# Patient Record
Sex: Female | Born: 2000
Health system: Southern US, Community
[De-identification: ages and names within clinical notes are randomized; demographics above are authoritative.]

## PROBLEM LIST (undated history)

## (undated) ENCOUNTER — Emergency Department (HOSPITAL_COMMUNITY): Disposition: A | Discharge status: Home / Self Care | Payer: Medicaid Other

## (undated) ENCOUNTER — Ambulatory Visit: Admission: EM | Source: Home / Self Care

## (undated) ENCOUNTER — Ambulatory Visit

## (undated) ENCOUNTER — Emergency Department (HOSPITAL_BASED_OUTPATIENT_CLINIC_OR_DEPARTMENT_OTHER): Discharge status: Absent without leave

## (undated) DIAGNOSIS — Z789 Other specified health status: Secondary | ICD-10-CM

## (undated) DIAGNOSIS — I2699 Other pulmonary embolism without acute cor pulmonale: Secondary | ICD-10-CM

## (undated) HISTORY — PX: NO PAST SURGERIES: SHX2092

## (undated) HISTORY — PX: WISDOM TOOTH EXTRACTION: SHX21

---
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
## 2019-01-25 ENCOUNTER — Other Ambulatory Visit: Payer: Self-pay

## 2019-01-25 ENCOUNTER — Ambulatory Visit (INDEPENDENT_AMBULATORY_CARE_PROVIDER_SITE_OTHER): Payer: Medicaid Other | Admitting: Family Medicine

## 2019-01-25 ENCOUNTER — Encounter: Payer: Self-pay | Admitting: Family Medicine

## 2019-01-25 VITALS — BP 105/70 | HR 70 | Temp 97.9°F | Ht 62.99 in | Wt 194.4 lb

## 2019-01-25 DIAGNOSIS — N926 Irregular menstruation, unspecified: Secondary | ICD-10-CM

## 2019-01-25 DIAGNOSIS — Z0000 Encounter for general adult medical examination without abnormal findings: Secondary | ICD-10-CM

## 2019-01-25 LAB — POCT URINE PREGNANCY: Preg Test, Ur: NEGATIVE

## 2019-01-25 NOTE — Patient Instructions (Addendum)
Thank you for coming in to see [place] today. Please see below to review our plan for today's visit.  I do not think your birth control is causing your nausea.  I would keep a diary and record when you have your episodes, how long they last, what you are doing or eating prior to the episode.  Bring these to your next visit in 1 month.  Please call the clinic at [phone] if your symptoms worsen or you have any concerns. It was our pleasure to serve you.  Harriet Butte, [place], PGY-3

## 2019-01-25 NOTE — Progress Notes (Signed)
Routine Well-Adolescent Visit  Desirea's personal or confidential phone number: [phone]  PCP:  Bing, DO   History was provided by the patient.  Angela Johnston is a 18 y.o. female who is here for annual check-up.   Current concerns: Astaria has concerns about intermittent nausea without vomiting which does not occur at any particular time of day.  This will usually occur when she is eating.    Adolescent Assessment:  Confidentiality was discussed with the patient and if applicable, with caregiver as well.  Home and Environment:  Lives with: lives at home with mother and sister and brothers Parental relations: separated, lives with mom Friends/Peers: good Nutrition/Eating Behaviors: no Sports/Exercise:  no  Education and Employment:  School Status: in graduated, plans to work Work: [employer] at [employer]: none  With parent out of the room and confidentiality discussed:   Patient reports being comfortable and safe at school and at home? Yes  Smoking: no Secondhand smoke exposure? no Drugs/EtOH: no   Sexuality:  -Menarche: post menarchal - females:  last menses: about 2 years ago, on OCP - Menstrual History: absent  - Sexually active? yes - one female, uses protection  - sexual partners in last year: No immediate complications noted. - contraception use: condoms, oral contraceptives (estrogen/progesterone) - Last STI Screening: none - Violence/Abuse: no  Mood: Suicidality and Depression: no Weapons: no  Screenings: The patient completed the Rapid Assessment for Adolescent Preventive Services screening questionnaire and the following topics were identified as risk factors and discussed: healthy eating, exercise, abuse/trauma, weapon use, tobacco use, marijuana use, drug use, condom use and birth control   PHQ-9 completed and results indicated negative  Physical Exam:  BP 105/70   Pulse 70   Temp 97.9 F (36.6 C)   Ht 5' 2.99" (1.6 m)    Wt 194 lb 6.4 oz (88.2 kg)   SpO2 99%   BMI 34.45 kg/m  Blood pressure percentiles are not available for patients who are 18 years or older.  General Appearance:   alert, oriented, no acute distress and well nourished  HENT: Normocephalic, no obvious abnormality, PERRL, EOM's intact, conjunctiva clear  Mouth:   Normal appearing teeth, no obvious discoloration, dental caries, or dental caps  Neck:   Supple; thyroid: no enlargement, symmetric, no tenderness/mass/nodules  Lungs:   Clear to auscultation bilaterally, normal work of breathing  Heart:   Regular rate and rhythm, S1 and S2 normal, no murmurs;   Abdomen:   Soft, non-tender, no mass, or organomegaly  GU genitalia not examined  Musculoskeletal:   Tone and strength strong and symmetrical, all extremities               Lymphatic:   No cervical adenopathy  Skin/Hair/Nails:   Skin warm, dry and intact, no rashes, no bruises or petechiae  Neurologic:   Strength, gait, and coordination normal and age-appropriate    Assessment/Plan:  Brennyn is complaining of nausea without vomiting.  Urine pregnancy negative. I am uncertain what the source of this would be.  It does not appear to be her OCP since she has been on this for several years and the onset of her nausea has been over the course of 2 months.  There is no clear triggers.  Her weight is up from her prior growth chart.  Advised her to record the episodes of nausea and return to the clinic in 1 month.  BMI: is appropriate for age  Immunizations today: per orders. History  of previous adverse reactions to immunizations? no Counseling completed for all of the vaccine components. Orders Placed This Encounter  Procedures  . POCT urine pregnancy   - Follow-up visit in 1 year for next visit, or sooner as needed.   Wendee Beaversavid J McMullen, DO

## 2019-02-19 ENCOUNTER — Ambulatory Visit: Payer: Self-pay | Admitting: Family Medicine

## 2019-03-02 ENCOUNTER — Encounter (HOSPITAL_COMMUNITY): Payer: Self-pay

## 2019-03-02 ENCOUNTER — Ambulatory Visit (HOSPITAL_COMMUNITY)
Admission: EM | Admit: 2019-03-02 | Discharge: 2019-03-02 | Disposition: A | Discharge status: Home / Self Care | Payer: Medicaid Other | Attending: Emergency Medicine | Admitting: Emergency Medicine

## 2019-03-02 ENCOUNTER — Other Ambulatory Visit: Payer: Self-pay

## 2019-03-02 DIAGNOSIS — M25522 Pain in left elbow: Secondary | ICD-10-CM

## 2019-03-02 DIAGNOSIS — M546 Pain in thoracic spine: Secondary | ICD-10-CM | POA: Diagnosis not present

## 2019-03-02 MED ORDER — IBUPROFEN 600 MG PO TABS: 600.0000 mg | ORAL_TABLET | Freq: Four times a day (QID) | ORAL | 0 refills | Status: DC | PRN

## 2019-03-02 NOTE — ED Provider Notes (Signed)
[facility]    CSN: [id number] Arrival date & time: 03/02/19  [id number]     History   Chief Complaint Chief Complaint  Patient presents with  . Back Pain  . Arm Numbness    HPI Angela Johnston is a 18 y.o. female.   Patient presents with left upper back pain with left arm pain and numbness since she woke up this morning.  She reports her pain was initially 5/10 but has been steadily, spontaneously improving over the course of the morning.  She denies aggravating or alleviating factors.  She denies injury or falls.  She denies chest pain, shortness of breath, abdominal pain, nausea, vomiting, diarrhea, dysuria, pelvic pain, vaginal discharge.    The history is provided by the patient.    History reviewed. No pertinent past medical history.  Patient Active Problem List   Diagnosis Date Noted  . 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 Allergic rhinitis 03/09/2012  . 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 No pertinent surgical history.  OB History   No obstetric history on file.      Home Medications    Prior to Admission medications   Medication Sig Start Date End Date Taking? Authorizing Provider  ibuprofen (ADVIL) 600 MG tablet Take 1 tablet (600 mg total) by mouth every 6 (six) hours as needed. 03/02/19   Mickie Bailate, Bela Bonaparte H, NP  JUNEL 1.5/30 1.5-30 MG-MCG tablet TAKE 1 TABLET BY MOUTH EVERY DAY 08/17/18   Wendee BeaversMcMullen, David J, DO  sulfamethoxazole-trimethoprim (BACTRIM DS,SEPTRA DS) 800-160 MG tablet Take 1 tablet by mouth 2 (two) times daily. 07/31/18   Wallis BambergMani, Mario, PA-C    Family History Family History  Family history unknown: Yes    Social History Social History   Tobacco Use  . Smoking status: Never Smoker  . Smokeless tobacco: Never Used  Substance Use Topics  . Alcohol use: Not on file  . Drug use: Not on file     Allergies   Patient has no known allergies.   Review of Systems Review of Systems  Constitutional:  Negative for chills and fever.  HENT: Negative for ear pain and sore throat.   Eyes: Negative for pain and visual disturbance.  Respiratory: Negative for cough and shortness of breath.   Cardiovascular: Negative for chest pain and palpitations.  Gastrointestinal: Negative for abdominal pain and vomiting.  Genitourinary: Negative for dysuria and hematuria.  Musculoskeletal: Positive for back pain and myalgias. Negative for arthralgias.  Skin: Negative for color change and rash.  Neurological: Negative for seizures and syncope.  All other systems reviewed and are negative.    Physical Exam Triage Vital Signs ED Triage Vitals  Enc Vitals Group     BP 03/02/19 0921 124/87     Pulse Rate 03/02/19 0921 80     Resp 03/02/19 0921 17     Temp 03/02/19 0921 98.3 F (36.8 C)     Temp Source 03/02/19 0921 Oral     SpO2 03/02/19 0921 94 %     Weight --      Height --      Head Circumference --      Peak Flow --      Pain Score 03/02/19 0922 5     Pain Loc --      Pain Edu? --      Excl. in GC? --    No data found.  Updated Vital Signs BP 124/87 (BP Location: Left Arm)  Pulse 80   Temp 98.3 F (36.8 C) (Oral)   Resp 17   SpO2 94%   Visual Acuity Right Eye Distance:   Left Eye Distance:   Bilateral Distance:    Right Eye Near:   Left Eye Near:    Bilateral Near:     Physical Exam Vitals signs and nursing note reviewed.  Constitutional:      General: She is not in acute distress.    Appearance: She is well-developed.  HENT:     Head: Normocephalic and atraumatic.  Eyes:     Conjunctiva/sclera: Conjunctivae normal.  Neck:     Musculoskeletal: Neck supple.  Cardiovascular:     Rate and Rhythm: Normal rate and regular rhythm.     Heart sounds: No murmur.  Pulmonary:     Effort: Pulmonary effort is normal. No respiratory distress.     Breath sounds: Normal breath sounds.  Abdominal:     Palpations: Abdomen is soft.     Tenderness: There is no abdominal tenderness.   Musculoskeletal: Normal range of motion.        General: No swelling, tenderness, deformity or signs of injury.  Skin:    General: Skin is warm and dry.     Findings: No bruising, erythema, lesion or rash.  Neurological:     General: No focal deficit present.     Mental Status: She is alert and oriented to person, place, and time.     Sensory: No sensory deficit.     Motor: No weakness.     Coordination: Coordination normal.     Gait: Gait normal.     Deep Tendon Reflexes: Reflexes normal.      UC Treatments / Results  Labs (all labs ordered are listed, but only abnormal results are displayed) Labs Reviewed - No data to display  EKG   Radiology No results found.  Procedures Procedures (including critical care time)  Medications Ordered in UC Medications - No data to display  Initial Impression / Assessment and Plan / UC Course  I have reviewed the triage vital signs and the nursing notes.  Pertinent labs & imaging results that were available during my care of the patient were reviewed by me and considered in my medical decision making (see chart for details).   Left back and arm pain.  Unable to elicit or re-create pain on exam.  Treating today with ibuprofen as needed.  Discussed with patient that she should return here or go to the ED if her pain worsens; or if she develops other concerning symptoms such as chest pain, shortness of breath, abdominal pain, vomiting, diarrhea, dysuria.     Final Clinical Impressions(s) / UC Diagnoses   Final diagnoses:  Acute left-sided thoracic back pain  Arthralgia of left upper arm     Discharge Instructions     Take the prescribed ibuprofen as needed for your pain.    Return here or go to the emergency department if you develop worsening pain; or if you develop chest pain, shortness of breath, abdominal pain, vomiting, diarrhea, difficulty with urination, or other symptoms.        ED Prescriptions    Medication Sig  Dispense Auth. Provider   ibuprofen (ADVIL) 600 MG tablet Take 1 tablet (600 mg total) by mouth every 6 (six) hours as needed. 30 tablet Sharion Balloon, NP     Controlled Substance Prescriptions [place] Controlled Substance Registry consulted? Not Applicable   Sharion Balloon, NP 03/02/19 1013

## 2019-03-02 NOTE — ED Notes (Signed)
Patient able to ambulate independently  

## 2019-03-02 NOTE — ED Notes (Signed)
Pt at nurse's station, requested a cup of ice, provided

## 2019-03-02 NOTE — ED Triage Notes (Signed)
Pt presents with left lower back pain and left side arm numbness since this morning.

## 2019-03-02 NOTE — Discharge Instructions (Addendum)
Take the prescribed ibuprofen as needed for your pain.    Return here or go to the emergency department if you develop worsening pain; or if you develop chest pain, shortness of breath, abdominal pain, vomiting, diarrhea, difficulty with urination, or other symptoms.

## 2019-03-06 ENCOUNTER — Ambulatory Visit (INDEPENDENT_AMBULATORY_CARE_PROVIDER_SITE_OTHER): Payer: 59 | Admitting: Family Medicine

## 2019-03-06 ENCOUNTER — Encounter: Payer: Self-pay | Admitting: Family Medicine

## 2019-03-06 ENCOUNTER — Other Ambulatory Visit: Payer: Self-pay

## 2019-03-06 DIAGNOSIS — M549 Dorsalgia, unspecified: Secondary | ICD-10-CM

## 2019-03-06 DIAGNOSIS — Z3009 Encounter for other general counseling and advice on contraception: Secondary | ICD-10-CM | POA: Diagnosis not present

## 2019-03-06 NOTE — Progress Notes (Signed)
Date of Visit: 03/06/2019   HPI:  Angela Johnston is a 18 year old female presenting with back pain, breast tenderness and loss of appetite which she attributes to her birth control. The back pain started a month ago and is non-radiating and constant, not relived by alive or ibuprofen. The breast tenderness has been on and off for the past month. Her loss of appetite is better on some days and worse on others. Denies nausea, vomiting, or diarrhea. She reports noticeable weight gain. No vaginal discharge or spotting but some mild cramping. Her last menstrual cycle was 2-3 years ago. No headaches or fevers.   She has been on Junel for the past 5 years and is requesting to stop taking the medication. She has a weeks worth of pills left. For future pregnancy prevention, she wants to use condoms.   She was seen at an urgent care 5 days ago for left sided back pain probably MSK in origin which was treated with 600mg  of ibuprofen. She has been taking 1 and half pills of the ibuprofen but still reports pain.   ROS: See HPI.  PHYSICAL EXAM: BP 105/62   Pulse 98   Temp 97.9 F (36.6 C) (Oral)   Wt 200 lb (90.7 kg)   SpO2 98%   BMI 35.44 kg/m  Gen: Alert and oriented. In no acute distress.  HEENT: Pupils round and reactive to light. Good oculomotor function. No redness of TM. MMM without redness of the oropharynx. No cervical LAD.  Heart: S1S2 RRR without murmurs or gallops. Good capillary refill.  Lungs: CTA bilaterally no wheezing or crackles. Good work of breathing.  Abd: Normoactive bowel sounds. Soft and non-tender abdomen. Tender to palpation over the left ribs.  Ext: No LEE.   ASSESSMENT/PLAN:  Birth control counseling Patient has been experiencing weight gain, loss of appetite, breast tenderness and back pain over the past month which she attributes to her birth control pills. She is interested in stopping the pills after next week and is planning on using condoms for pregnancy prevention.  Patient counseled on  the different pregnancy options and their efficacy rates and was encouraged to reach out to the office with questions or if she changes her mind about her method of birth control.   Back pain Patient was last seen at an urgent care on 7/31 for acute back pain over the left ribcage. The area was tender to palpation today and most likely has an MSK origin. The absence of recent trauma to the area and good range of motion an on physical exam along with no pain with breathing makes a rib fracture less likely. Advised to alternate between the 600 mg of ibuprofen and 650 Mg of Tylenol every 6 hours. In addition, topical Voltaren gel every 3 hours with a heating pack can be helpful. Follow up in to 1 week if pain gets worse in quality with current regimen.     FOLLOW UP: Follow up if any changes in back pain or if wanting to discuses other options of birth control.   Vernice Jefferson MS3, Medical Student

## 2019-03-06 NOTE — Assessment & Plan Note (Addendum)
Patient was last seen at an urgent care on 7/31 for acute back pain over the left ribcage. The area was tender to palpation today and most likely has an MSK origin. The absence of recent trauma to the area and good range of motion an on physical exam along with no pain with breathing makes a rib fracture less likely. Advised to alternate between the 600 mg of ibuprofen and 650 Mg of Tylenol every 6 hours. In addition, topical Voltaren gel every 3 hours with a heating pack can be helpful. Follow up in to 1 week if pain gets worse in quality with current regimen.

## 2019-03-06 NOTE — Assessment & Plan Note (Signed)
Patient has been experiencing weight gain, loss of appetite, breast tenderness and back pain over the past month which she attributes to her birth control pills. She is interested in stopping the pills after next week and is planning on using condoms for pregnancy prevention. Patient counseled on  the different pregnancy options and their efficacy rates and was encouraged to reach out to the office with questions or if she changes her mind about her method of birth control.

## 2019-03-06 NOTE — Patient Instructions (Signed)
It was great meeting you today!  I am sorry you have had some side effects from your contraceptive pills.  We discussed a lot of alternatives but ultimately decided to just go with condoms.  If you change your mind please let me know and I be happy to assist you with other options.  In regards to your back pain think you likely have a musculoskeletal problem.  Likely due to 1 your back muscles or perhaps 1 of your rib muscles.  I would continue take the ibuprofen 600 mg every 6 hours.  You can take Tylenol 650 mg in between those doses.  You can pick up topical Voltaren gel apply 4-5 times per day to the affected area.  This will likely go away within the next 2 to 3 days.

## 2019-03-09 ENCOUNTER — Encounter: Payer: Self-pay | Admitting: Family Medicine

## 2019-05-13 DIAGNOSIS — R05 Cough: Secondary | ICD-10-CM | POA: Diagnosis not present

## 2019-05-13 DIAGNOSIS — Z20828 Contact with and (suspected) exposure to other viral communicable diseases: Secondary | ICD-10-CM | POA: Diagnosis not present

## 2019-06-05 ENCOUNTER — Encounter (HOSPITAL_COMMUNITY): Payer: Self-pay

## 2019-06-05 ENCOUNTER — Other Ambulatory Visit: Payer: Self-pay

## 2019-06-05 DIAGNOSIS — R531 Weakness: Secondary | ICD-10-CM | POA: Diagnosis not present

## 2019-06-05 DIAGNOSIS — R009 Unspecified abnormalities of heart beat: Secondary | ICD-10-CM | POA: Diagnosis present

## 2019-06-05 NOTE — ED Triage Notes (Signed)
Pt arrived stating 2-3 hours ago she starting feeling weak and felt as though she has an irregular heartbeat. Denies doing anything at the time. Denies any chest pain or shortness of breath.

## 2019-06-06 ENCOUNTER — Emergency Department (HOSPITAL_COMMUNITY)
Admission: EM | Admit: 2019-06-06 | Discharge: 2019-06-06 | Disposition: A | Discharge status: Home / Self Care | Payer: Medicaid Other | Attending: Emergency Medicine | Admitting: Emergency Medicine

## 2019-06-06 DIAGNOSIS — R531 Weakness: Secondary | ICD-10-CM

## 2019-06-06 LAB — CBC
HCT: 39.5 % (ref 36.0–46.0)
Hemoglobin: 12.9 g/dL (ref 12.0–15.0)
MCH: 28.2 pg (ref 26.0–34.0)
MCHC: 32.7 g/dL (ref 30.0–36.0)
MCV: 86.2 fL (ref 80.0–100.0)
Platelets: 284 10*3/uL (ref 150–400)
RBC: 4.58 MIL/uL (ref 3.87–5.11)
RDW: 13.1 % (ref 11.5–15.5)
WBC: 8.2 10*3/uL (ref 4.0–10.5)
nRBC: 0 % (ref 0.0–0.2)

## 2019-06-06 LAB — BASIC METABOLIC PANEL
Anion gap: 8 (ref 5–15)
BUN: 7 mg/dL (ref 6–20)
CO2: 27 mmol/L (ref 22–32)
Calcium: 9.5 mg/dL (ref 8.9–10.3)
Chloride: 106 mmol/L (ref 98–111)
Creatinine, Ser: 0.79 mg/dL (ref 0.44–1.00)
GFR calc Af Amer: >60 mL/min (ref >60)
GFR calc non Af Amer: >60 mL/min (ref >60)
Glucose, Bld: 91 mg/dL (ref 70–99)
Potassium: 3.5 mmol/L (ref 3.5–5.1)
Sodium: 141 mmol/L (ref 135–145)

## 2019-06-06 LAB — RAPID URINE DRUG SCREEN, HOSP PERFORMED
Amphetamines: NOT DETECTED
Barbiturates: NOT DETECTED
Benzodiazepines: NOT DETECTED
Cocaine: NOT DETECTED
Opiates: NOT DETECTED
Tetrahydrocannabinol: NOT DETECTED

## 2019-06-06 LAB — I-STAT BETA HCG BLOOD, ED (MC, WL, AP ONLY): I-stat hCG, quantitative: <5 m[IU]/mL (ref <5)

## 2019-06-06 NOTE — ED Notes (Signed)
Pt verbalized discharge instructions and follow up care. Alert and ambulatory. No IV. Significant other is driving pt home

## 2019-06-06 NOTE — Discharge Instructions (Addendum)
Your laboratory results were within normal limits.  Please follow up with [facility], their number is attached to your paperwork.

## 2019-06-06 NOTE — ED Provider Notes (Signed)
[facility] DEPT Provider Note   CSN: [id number] Arrival date & time: 06/05/19  2246     History   Chief Complaint Chief Complaint  Patient presents with  . Irregular Heart Beat    HPI Angela Johnston is a 18 y.o. female.     18 y.o female with no PMH presents to the ED with a chief complaint of "irregular heartbeat". Patient reports she was playing with boyfriend when he suddenly sat on her chest, she reports taking her right hand and placing along her carotid, states she did not feel a beat at the time.  She states "I can usually feel it beating up they are always ".  Also reports she has been feeling more fatigued, overall weak since 2 PM today.  Patient also endorses some dizziness, feels like she is spinning although was ambulatory in the ED with a steady gait.  She reports her last meal was prior to arrival.  She endorses a cough which is somewhat dry, has not taken any medication for improvement in symptoms.  Her last menstrual period was October 16.  She is currently not on any birth control, was on OCPs but discontinued these in August.  She denies any chest pain, shortness of breath, abdominal pain, fever, headaches, prior history of blood clots.  The history is provided by the patient.    History reviewed. No pertinent past medical history.  Patient Active Problem List   Diagnosis Date Noted  . Birth control counseling 03/06/2019  . Back pain 03/06/2019  . 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 Allergic rhinitis 03/09/2012  . Intentional self-harm by sharp object ([place]) 02/08/2011    History reviewed. No pertinent surgical history.   OB History   No obstetric history on file.      Home Medications    Prior to Admission medications   Medication Sig Start Date End Date Taking? Authorizing Provider  ibuprofen (ADVIL) 600 MG tablet Take 1 tablet (600 mg total) by mouth every 6 (six) hours as needed. 03/02/19   Sharion Balloon, NP   sulfamethoxazole-trimethoprim (BACTRIM DS,SEPTRA DS) 800-160 MG tablet Take 1 tablet by mouth 2 (two) times daily. 07/31/18   Jaynee Eagles, PA-C    Family History Family History  Family history unknown: Yes    Social History Social History   Tobacco Use  . Smoking status: Never Smoker  . Smokeless tobacco: Never Used  Substance Use Topics  . Alcohol use: Not on file  . Drug use: Not on file     Allergies   Patient has no known allergies.   Review of Systems Review of Systems  Constitutional: Negative for chills and fever.  HENT: Negative for ear pain and sore throat.   Eyes: Negative for pain and visual disturbance.  Respiratory: Negative for cough and shortness of breath.   Cardiovascular: Positive for palpitations. Negative for chest pain.  Gastrointestinal: Negative for abdominal pain and vomiting.  Genitourinary: Negative for dysuria and hematuria.  Musculoskeletal: Negative for arthralgias and back pain.  Skin: Negative for color change and rash.  Neurological: Positive for weakness. Negative for seizures, syncope and headaches.  All other systems reviewed and are negative.    Physical Exam Updated Vital Signs BP (!) 148/73   Pulse 63   Temp 98.2 F (36.8 C) (Oral)   Resp 11   Ht 5\' 4"  (1.626 m)   Wt 90.7 kg   SpO2 100%   BMI 34.33 kg/m   Physical Exam  Vitals signs and nursing note reviewed.  Constitutional:      General: She is not in acute distress.    Appearance: She is well-developed.     Comments: In no distress, nonill appearing.  HENT:     Head: Normocephalic and atraumatic.     Mouth/Throat:     Pharynx: No oropharyngeal exudate.  Eyes:     Pupils: Pupils are equal, round, and reactive to light.  Neck:     Musculoskeletal: Normal range of motion.  Cardiovascular:     Rate and Rhythm: Normal rate and regular rhythm.     Heart sounds: Normal heart sounds. No murmur.     Comments: Normal rate, no murmurs. Pulmonary:     Effort:  Pulmonary effort is normal. No respiratory distress.     Breath sounds: Normal breath sounds.     Comments: Lungs are clear to auscultation without any wheezing, rhonchi, rales. Abdominal:     General: Bowel sounds are normal. There is no distension.     Palpations: Abdomen is soft.     Tenderness: There is no abdominal tenderness.     Comments: Abdomen is soft, no tenderness to palpation.  No CVA bilateral.  Musculoskeletal:        General: No tenderness or deformity.     Right lower leg: No edema.     Left lower leg: No edema.  Skin:    General: Skin is warm and dry.  Neurological:     Mental Status: She is alert and oriented to person, place, and time.     Comments: Alert, oriented, thought content appropriate. Speech fluent without evidence of aphasia. Able to follow 2 step commands without difficulty.  Cranial Nerves:  II:  Peripheral visual fields grossly normal, pupils, round, reactive to light III,IV, VI: ptosis not present, extra-ocular motions intact bilaterally  V,VII: smile symmetric, facial light touch sensation equal VIII: hearing grossly normal bilaterally  IX,X: midline uvula rise  XI: bilateral shoulder shrug equal and strong XII: midline tongue extension  Motor:  5/5 in upper and lower extremities bilaterally including strong and equal grip strength and dorsiflexion/plantar flexion Sensory: light touch normal in all extremities.  Cerebellar: normal finger-to-nose with bilateral upper extremities, pronator drift negative Gait: normal gait and balance       ED Treatments / Results  Labs (all labs ordered are listed, but only abnormal results are displayed) Labs Reviewed  BASIC METABOLIC PANEL  CBC  RAPID URINE DRUG SCREEN, HOSP PERFORMED  URINALYSIS, ROUTINE W REFLEX MICROSCOPIC  I-STAT BETA HCG BLOOD, ED ([facility], [facility], [facility] ONLY)    EKG EKG Interpretation  Date/Time:  Tuesday June 05 2019 23:06:58 EST Ventricular Rate:  65 PR Interval:    QRS Duration:  81 QT Interval:  376 QTC Calculation: 391 R Axis:   48 Text Interpretation: Sinus rhythm No significant change since last tracing Confirmed by Drema Pry [phone]) on 06/06/2019 1:10:10 AM   Radiology No results found.  Procedures Procedures (including critical care time)  Medications Ordered in ED Medications - No data to display   Initial Impression / Assessment and Plan / ED Course  I have reviewed the triage vital signs and the nursing notes.  Pertinent labs & imaging results that were available during my care of the patient were reviewed by me and considered in my medical decision making (see chart for details).       Patient with no pertinent past medical history presents to the ED with complaints of "irregular  heartbeat ".  Patient reports she was playing around with her boyfriend when she suddenly put her hand on her neck, did not feel a pulse, reports this was irregular.  She also reports has been feeling fatigued, weak since 2 PM this evening.  She has not had any similar episodes in the past.  She does report dizziness, states she feels like she is spinning although she has been ambulatory in the ED with a steady gait and no assistance.  During my evaluation patient is non-ill-appearing, she is laughing and playing around with boyfriend at the bedside.  Vitals are within normal limits, oxygen is at 100% on room air, no tachypnea, tachycardia, she is currently not on any OCPs, has not syncopized,PERC negative.   CBC showed no leukocytosis, hemoglobin is within normal limits, low suspicion for any anemia.  BMP without any electrolyte derangement, creatinine level is within normal limits.  A UA along with a UDS were collected.  EKG showed no changes consistent with infarct or STEMI.  Normal sinus rhythm, no delta wave noted, doubt WPW.  Patient was placed on telemetry during her stay in the ED without any PVCs or irregular rhythms.   4:30 AM UDS without any illicit drug. UA  is pending but patient denies any urinary symptoms at this time.  Explained these results with patient, she is currently sleeping, her boyfriend agreeable to follow-up with outpatient clinic.  Portions of this note were generated with Scientist, clinical (histocompatibility and immunogenetics)Dragon dictation software. Dictation errors may occur despite best attempts at proofreading.  Final Clinical Impressions(s) / ED Diagnoses   Final diagnoses:  Weakness    ED Discharge Orders    None       Claude MangesSoto, Davanta Meuser, PA-C 06/06/19 [phone]    Nira Connardama, Pedro Eduardo, MD 06/06/19 [phone]

## 2019-06-06 NOTE — ED Notes (Signed)
Pt ambulated to the bathroom without assistance. Gait steady  

## 2019-06-07 ENCOUNTER — Telehealth: Payer: Medicaid Other | Admitting: Emergency Medicine

## 2019-06-07 DIAGNOSIS — U071 COVID-19: Secondary | ICD-10-CM

## 2019-06-07 NOTE — Progress Notes (Signed)
E-Visit for Corona Virus Screening   Your current symptoms could be consistent with the coronavirus.  Many health care providers can now test patients at their office but not all are.  [place] has multiple testing sites. For information on our COVID testing locations and hours go to [email or web address]  Please quarantine yourself while awaiting your test results.  We are enrolling you in our MyChart Home Montioring for COVID19 . Daily you will receive a questionnaire within the MyChart website. Our COVID 19 response team willl be monitoriing your responses daily.  You can go to one of the testing sites listed below, while they are opened (see hours). You do not need an order and will stay in your car during the test. You do need to self isolate until your results return and if positive 10 days from when your symptoms started and until you are 3 days fever free.    Testing Locations (Monday - Friday, 8 a.m. - 3:30 p.m.)  [place]: [facility] at [facility], [address] [place], [place]   [place]: [address] [place], [place] (entrance off [employer])   [place]: (Closed each Monday): Testing site relocated to the short stay covered drive at [facility]. (Use the [employer] entrance to [facility] next to [facility].)   COVID-19 is a respiratory illness with symptoms that are similar to the flu. Symptoms are typically mild to moderate, but there have been cases of severe illness and death due to the virus. The following symptoms may appear 2-14 days after exposure: . Fever . Cough . Shortness of breath or difficulty breathing . Chills . Repeated shaking with chills . Muscle pain . Headache . Sore throat . New loss of taste or smell . Fatigue . Congestion or runny nose . Nausea or vomiting . Diarrhea  It is vitally important that if you feel that you  have an infection such as this virus or any other virus that you stay home and away from places where you may spread it to others.  You should self-quarantine for 14 days if you have symptoms that could potentially be coronavirus or have been in close contact a with a person diagnosed with COVID-19 within the last 2 weeks. You should avoid contact with people age 74 and older.   You should wear a mask or cloth face covering over your nose and mouth if you must be around other people or animals, including pets (even at home). Try to stay at least 6 feet away from other people. This will protect the people around you.    You may also take acetaminophen (Tylenol) as needed for fever.   Reduce your risk of any infection by using the same precautions used for avoiding the common cold or flu:  Marland Kitchen Wash your hands often with soap and warm water for at least 20 seconds.  If soap and water are not readily available, use an alcohol-based hand sanitizer with at least 60% alcohol.  . If coughing or sneezing, cover your mouth and nose by coughing or sneezing into the elbow areas of your shirt or coat, into a tissue or into your sleeve (not your hands). . Avoid shaking hands with others and consider head nods or verbal greetings only. . Avoid touching your eyes, nose, or mouth with unwashed hands.  . Avoid close contact with people who are sick. . Avoid places or events with large numbers  of people in one location, like concerts or sporting events. . Carefully consider travel plans you have or are making. . If you are planning any travel outside or inside the [place], visit the CDC's Travelers' Health webpage for the latest health notices. . If you have some symptoms but not all symptoms, continue to monitor at home and seek medical attention if your symptoms worsen. . If you are having a medical emergency, call 911.  HOME CARE . Only take medications as instructed by your medical team. . Drink plenty of fluids  and get plenty of rest. . A steam or ultrasonic humidifier can help if you have congestion.   GET HELP RIGHT AWAY IF YOU HAVE EMERGENCY WARNING SIGNS** FOR COVID-19. If you or someone is showing any of these signs seek emergency medical care immediately. Call 911 or proceed to your closest emergency facility if: . You develop worsening high fever. . Trouble breathing . Bluish lips or face . Persistent pain or pressure in the chest . New confusion . Inability to wake or stay awake . You cough up blood. . Your symptoms become more severe  **This list is not all possible symptoms. Contact your medical provider for any symptoms that are sever or concerning to you.   MAKE SURE YOU   Understand these instructions.  Will watch your condition.  Will get help right away if you are not doing well or get worse.  Your e-visit answers were reviewed by a board certified advanced clinical practitioner to complete your personal care plan.  Depending on the condition, your plan could have included both over the counter or prescription medications.  If there is a problem please reply once you have received a response from your provider.  Your safety is important to [place].  If you have drug allergies check your prescription carefully.    You can use MyChart to ask questions about today's visit, request a non-urgent call back, or ask for a work or school excuse for 24 hours related to this e-Visit. If it has been greater than 24 hours you will need to follow up with your provider, or enter a new e-Visit to address those concerns. You will get an e-mail in the next two days asking about your experience.  I hope that your e-visit has been valuable and will speed your recovery. Thank you for using e-visits.   Greater than 5 but less than 10 minutes spent researching, coordinating, and implementing care for this patient today

## 2019-06-12 DIAGNOSIS — R05 Cough: Secondary | ICD-10-CM | POA: Diagnosis not present

## 2019-06-12 DIAGNOSIS — Z20828 Contact with and (suspected) exposure to other viral communicable diseases: Secondary | ICD-10-CM | POA: Diagnosis not present

## 2019-07-02 DIAGNOSIS — H1013 Acute atopic conjunctivitis, bilateral: Secondary | ICD-10-CM | POA: Diagnosis not present

## 2019-07-11 ENCOUNTER — Ambulatory Visit (HOSPITAL_COMMUNITY)
Admission: EM | Admit: 2019-07-11 | Discharge: 2019-07-11 | Disposition: A | Discharge status: Home / Self Care | Payer: Medicaid Other

## 2019-07-11 ENCOUNTER — Other Ambulatory Visit: Payer: Self-pay

## 2019-07-11 DIAGNOSIS — R05 Cough: Secondary | ICD-10-CM | POA: Diagnosis not present

## 2019-07-11 DIAGNOSIS — Z03818 Encounter for observation for suspected exposure to other biological agents ruled out: Secondary | ICD-10-CM | POA: Diagnosis not present

## 2019-07-11 DIAGNOSIS — J029 Acute pharyngitis, unspecified: Secondary | ICD-10-CM | POA: Diagnosis not present

## 2019-07-11 NOTE — ED Notes (Signed)
Patient requesting covid testing.  Patient has a cough for 2 weeks.  [facility] and tested Negative for Covid.  Unable to access records from this source.  Spoke to Augusto Gamble, NP.  Will do send out test for this patient.  Notified patient

## 2019-07-12 ENCOUNTER — Other Ambulatory Visit: Payer: Self-pay

## 2019-07-12 ENCOUNTER — Telehealth (INDEPENDENT_AMBULATORY_CARE_PROVIDER_SITE_OTHER): Payer: Medicaid Other | Admitting: Family Medicine

## 2019-07-12 DIAGNOSIS — R05 Cough: Secondary | ICD-10-CM

## 2019-07-12 DIAGNOSIS — R059 Cough, unspecified: Secondary | ICD-10-CM | POA: Insufficient documentation

## 2019-07-12 MED ORDER — LORATADINE 10 MG PO TABS: 10.0000 mg | ORAL_TABLET | Freq: Every day | ORAL | 3 refills | Status: DC

## 2019-07-12 NOTE — Progress Notes (Signed)
[facility] Telemedicine Visit  Patient consented to have virtual visit. Method of visit: Video  Encounter participants: Patient: Angela Johnston - located at home Provider: Daisy Floro - located at family practice center Others (if applicable): None  Chief Complaint: Dry cough  HPI: CC: Dry cough   HPI:  Cough: For the past 2 weeks has had dry cough, mom wanted her to get checked to see if she has bronchitis.  The cough started out of no where.  Cough primarily happens at night.  She has been taking cough drops, OTC meds (DM, Tussin) to decrease the cough, has been helping a little. She takes the meds before she goes to bed. Nothing comes up when she coughs.  She has not been sick recently, denies sick contacts.  No fever, no shortness of breath, no runny nose or stuffy nose, no sore throat, no redness in her throat.  Denies burning in chest. No N, V, abdominal pain, constipation, diarrhea, rashes.   FLU SHOT: has not gotten one yet   ROS: per HPI  Pertinent PMHx:  Allergic rhinitis  Family history: mom, siblings have asthma  Exam:  Mouth/throat: Good dentition, no pharyngeal erythema or exudates appreciated Neck: no LAD Respiratory: Speaking in full sentences Skin: No rashes appreciated  Assessment/Plan: Cough Cough since 2 weeks. DDx Asthma, postnasal drip, GERD, Viral URI or postviral cough.  During the pandemic must also consider Covid.  The patient does not have a personal history of asthma, but does have a family history.  Denies shortness of breath. -Claritin 10 mg once daily to reduce allergic rhinitis as cause of cough -Flonase to each nostril once daily  -Honey by mouth 3 times daily for cough -Patient instructed to follow-up with [place] should her symptoms worsen or to seek help at the emergency department should she develop shortness of breath -Patient encouraged to get tested for Covid    Time spent during visit with patient: 15  minutes  Milus Banister, [place], PGY-2 07/12/2019 5:04 PM

## 2019-07-12 NOTE — Assessment & Plan Note (Signed)
Cough since 2 weeks. DDx Asthma, postnasal drip, GERD, Viral URI or postviral cough.  During the pandemic must also consider Covid.  The patient does not have a personal history of asthma, but does have a family history.  Denies shortness of breath. -Claritin 10 mg once daily to reduce allergic rhinitis as cause of cough -Flonase to each nostril once daily  -Honey by mouth 3 times daily for cough -Patient instructed to follow-up with [place] should her symptoms worsen or to seek help at the emergency department should she develop shortness of breath -Patient encouraged to get tested for Covid

## 2019-07-18 ENCOUNTER — Ambulatory Visit (HOSPITAL_COMMUNITY)
Admission: EM | Admit: 2019-07-18 | Discharge: 2019-07-18 | Disposition: A | Discharge status: Home / Self Care | Payer: Medicaid Other | Attending: Family Medicine | Admitting: Family Medicine

## 2019-07-18 ENCOUNTER — Encounter (HOSPITAL_COMMUNITY): Payer: Self-pay | Admitting: Emergency Medicine

## 2019-07-18 ENCOUNTER — Other Ambulatory Visit: Payer: Self-pay

## 2019-07-18 DIAGNOSIS — H938X1 Other specified disorders of right ear: Secondary | ICD-10-CM | POA: Diagnosis not present

## 2019-07-18 MED ORDER — AMOXICILLIN 500 MG PO CAPS: 1000.0000 mg | ORAL_CAPSULE | Freq: Three times a day (TID) | ORAL | 0 refills | Status: AC

## 2019-07-18 NOTE — Discharge Instructions (Addendum)
Keep using the Flonase daily.  Start the amoxicillin as prescribed  Follow up as needed for continued or worsening symptoms

## 2019-07-18 NOTE — ED Provider Notes (Signed)
[place]    CSN: [id number] Arrival date & time: 07/18/19  1228      History   Chief Complaint Chief Complaint  Patient presents with  . Ear Problem    HPI Angela Johnston is a 18 y.o. female.   Patient is a 18 year old female presents today with bilateral ear stuffiness worse on the right.  Describes fullness mostly in the right ear with trouble hearing.  This started last night.  Previously for the past week she has had nasal congestion, rhinorrhea.  Denies any foreign object in the ear.  Denies any fever, cough, congestion, sore throat.  She has been taking Flonase and Claritin daily for the last week.  ROS per HPI      History reviewed. No pertinent past medical history.  Patient Active Problem List   Diagnosis Date Noted  . Cough 07/12/2019  . Birth control counseling 03/06/2019  . Back pain 03/06/2019  . 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 Allergic rhinitis 03/09/2012  . Intentional self-harm by sharp object ([place]) 02/08/2011    History reviewed. No pertinent surgical history.  OB History   No obstetric history on file.      Home Medications    Prior to Admission medications   Medication Sig Start Date End Date Taking? Authorizing Provider  loratadine (CLARITIN) 10 MG tablet Take 1 tablet (10 mg total) by mouth daily. 07/12/19  Yes Milus Banister C, DO  amoxicillin (AMOXIL) 500 MG capsule Take 2 capsules (1,000 mg total) by mouth 3 (three) times daily for 5 days. 07/18/19 07/23/19  Orvan July, NP    Family History Family History  Problem Relation Age of Onset  . Healthy Mother   . Healthy Father     Social History Social History   Tobacco Use  . Smoking status: Never Smoker  . Smokeless tobacco: Never Used  Substance Use Topics  . Alcohol use: Not on file  . Drug use: Not on file     Allergies   Patient has no known allergies.   Review of Systems Review of Systems   Physical Exam Triage Vital Signs ED Triage  Vitals  Enc Vitals Group     BP 07/18/19 1242 106/62     Pulse Rate 07/18/19 1242 91     Resp 07/18/19 1242 16     Temp 07/18/19 1242 98.6 F (37 C)     Temp Source 07/18/19 1242 Oral     SpO2 07/18/19 1242 100 %     Weight --      Height --      Head Circumference --      Peak Flow --      Pain Score 07/18/19 1245 0     Pain Loc --      Pain Edu? --      Excl. in [place]? --    No data found.  Updated Vital Signs BP 106/62 (BP Location: Right Arm)   Pulse 91   Temp 98.6 F (37 C) (Oral)   Resp 16   LMP 06/25/2019   SpO2 100%   Visual Acuity Right Eye Distance:   Left Eye Distance:   Bilateral Distance:    Right Eye Near:   Left Eye Near:    Bilateral Near:     Physical Exam Vitals and nursing note reviewed.  Constitutional:      General: She is not in acute distress.    Appearance: Normal appearance. She is not ill-appearing, toxic-appearing or  diaphoretic.  HENT:     Head: Normocephalic.     Right Ear: Ear canal and external ear normal. Decreased hearing noted. No drainage or swelling. Tympanic membrane is erythematous and retracted.     Left Ear: Tympanic membrane, ear canal and external ear normal.     Nose: Nose normal.     Mouth/Throat:     Pharynx: Oropharynx is clear.  Eyes:     Conjunctiva/sclera: Conjunctivae normal.  Pulmonary:     Effort: Pulmonary effort is normal.  Musculoskeletal:        General: Normal range of motion.     Cervical back: Normal range of motion.  Skin:    General: Skin is warm and dry.     Findings: No rash.  Neurological:     Mental Status: She is alert.  Psychiatric:        Mood and Affect: Mood normal.      UC Treatments / Results  Labs (all labs ordered are listed, but only abnormal results are displayed) Labs Reviewed - No data to display  EKG   Radiology No results found.  Procedures Procedures (including critical care time)  Medications Ordered in UC Medications - No data to display  Initial  Impression / Assessment and Plan / UC Course  I have reviewed the triage vital signs and the nursing notes.  Pertinent labs & imaging results that were available during my care of the patient were reviewed by me and considered in my medical decision making (see chart for details).     Right ear fullness- right TM retracted and erythematous   Will cover for ear infection.  Will have her keep using the Flonase.  Follow up as needed for continued or worsening symptoms  Final Clinical Impressions(s) / UC Diagnoses   Final diagnoses:  Ear fullness, right     Discharge Instructions     Keep using the Flonase daily.  Start the amoxicillin as prescribed  Follow up as needed for continued or worsening symptoms    ED Prescriptions    Medication Sig Dispense Auth. Provider   amoxicillin (AMOXIL) 500 MG capsule Take 2 capsules (1,000 mg total) by mouth 3 (three) times daily for 5 days. 30 capsule Shriya Aker A, NP     PDMP not reviewed this encounter.   Janace Aris, NP 07/18/19 1322

## 2019-07-18 NOTE — ED Triage Notes (Signed)
Bilateral ear stuffiness.  Onset of stuffiness occurred last night while in shower.  Denies having put anything in ears

## 2019-07-30 DIAGNOSIS — H04123 Dry eye syndrome of bilateral lacrimal glands: Secondary | ICD-10-CM | POA: Diagnosis not present

## 2019-08-01 ENCOUNTER — Encounter (HOSPITAL_COMMUNITY): Payer: Self-pay | Admitting: Emergency Medicine

## 2019-08-01 ENCOUNTER — Other Ambulatory Visit: Payer: Self-pay

## 2019-08-01 ENCOUNTER — Ambulatory Visit (HOSPITAL_COMMUNITY)
Admission: EM | Admit: 2019-08-01 | Discharge: 2019-08-01 | Disposition: A | Discharge status: Home / Self Care | Payer: Medicaid Other | Attending: Family Medicine | Admitting: Family Medicine

## 2019-08-01 DIAGNOSIS — L918 Other hypertrophic disorders of the skin: Secondary | ICD-10-CM

## 2019-08-01 NOTE — ED Triage Notes (Signed)
Small skin tag to right side of neck. She developed pain today.

## 2019-08-04 NOTE — ED Provider Notes (Signed)
  [facility]   [id number] 08/01/19 Arrival Time: 1836  ASSESSMENT & PLAN:  1. Inflamed skin tag     Declines removal today. Will place band-aid over it to reduce friction. Will return if she changes her mind about having this removed.   Reviewed expectations re: course of current medical issues. Questions answered. Outlined signs and symptoms indicating need for more acute intervention. Patient verbalized understanding. After Visit Summary given.   SUBJECTIVE:  Angela Johnston is a 19 y.o. female who presents with a skin complaint. Irritated skin tag on R neck. Present "for a long time". Questions if clothing rubbing up against her neck. No bleeding. No swelling. No home treatment.   ROS: As per HPI.  OBJECTIVE: Vitals:   08/01/19 1907  BP: 112/68  Pulse: 68  Resp: 16  Temp: 98.4 F (36.9 C)  TempSrc: Oral  SpO2: 97%    General appearance: alert; no distress HEENT: [place]; AT Neck: supple with FROM Skin: warm and dry; small irritated skin tag on R neck; no bleeding or swelling; no sign of infection Psychological: alert and cooperative; normal mood and affect  No Known Allergies   Social History   Socioeconomic History  . Marital status: Single    Spouse name: Not on file  . Number of children: Not on file  . Years of education: Not on file  . Highest education level: Not on file  Occupational History  . Not on file  Tobacco Use  . Smoking status: Never Smoker  . Smokeless tobacco: Never Used  Substance and Sexual Activity  . Alcohol use: Not on file  . Drug use: Not on file  . Sexual activity: Not on file  Other Topics Concern  . Not on file  Social History Narrative  . Not on file   Social Determinants of Health   Financial Resource Strain:   . Difficulty of Paying Living Expenses: Not on file  Food Insecurity:   . Worried About Programme researcher, broadcasting/film/video in the Last Year: Not on file  . Ran Out of Food in the Last Year: Not on file    Transportation Needs:   . Lack of Transportation (Medical): Not on file  . Lack of Transportation (Non-Medical): Not on file  Physical Activity:   . Days of Exercise per Week: Not on file  . Minutes of Exercise per Session: Not on file  Stress:   . Feeling of Stress : Not on file  Social Connections:   . Frequency of Communication with Friends and Family: Not on file  . Frequency of Social Gatherings with Friends and Family: Not on file  . Attends Religious Services: Not on file  . Active Member of Clubs or Organizations: Not on file  . Attends Banker Meetings: Not on file  . Marital Status: Not on file  Intimate Partner Violence:   . Fear of Current or Ex-Partner: Not on file  . Emotionally Abused: Not on file  . Physically Abused: Not on file  . Sexually Abused: Not on file   Family History  Problem Relation Age of Onset  . Healthy Mother   . Healthy Father    History reviewed. No pertinent surgical history.   Mardella Layman, MD 08/04/19 1002

## 2019-08-29 ENCOUNTER — Ambulatory Visit (INDEPENDENT_AMBULATORY_CARE_PROVIDER_SITE_OTHER): Payer: Medicaid Other | Admitting: Family Medicine

## 2019-08-29 ENCOUNTER — Other Ambulatory Visit: Payer: Self-pay

## 2019-08-29 ENCOUNTER — Encounter: Payer: Self-pay | Admitting: Family Medicine

## 2019-08-29 VITALS — BP 100/62 | HR 65 | Ht 64.0 in | Wt 205.2 lb

## 2019-08-29 DIAGNOSIS — R5383 Other fatigue: Secondary | ICD-10-CM | POA: Diagnosis not present

## 2019-08-29 DIAGNOSIS — Z3200 Encounter for pregnancy test, result unknown: Secondary | ICD-10-CM

## 2019-08-29 DIAGNOSIS — G8929 Other chronic pain: Secondary | ICD-10-CM

## 2019-08-29 DIAGNOSIS — M549 Dorsalgia, unspecified: Secondary | ICD-10-CM | POA: Diagnosis not present

## 2019-08-29 DIAGNOSIS — N921 Excessive and frequent menstruation with irregular cycle: Secondary | ICD-10-CM

## 2019-08-29 LAB — POCT URINE PREGNANCY: Preg Test, Ur: NEGATIVE

## 2019-08-29 NOTE — Progress Notes (Signed)
B

## 2019-08-29 NOTE — Patient Instructions (Signed)
It was great seeing you today!  I am sorry that you are having so much back pain.  At your request I have placed a referral to the plastic surgeon for breast reduction evaluation.  We discussed some of the potential causes for a late menstrual cycle.  Your pregnancy test was negative.  Probably we can start simply and evaluate your thyroid function as well as check you for low blood counts.  These are by far the most common cause.  Other things that may be affecting her menstrual cycle or your history of birth control and perhaps your insomnia due to working third shift.  These can sometimes delay menstrual cycle as well.  If your menstrual cycles continue to be absent for up to 3 months, we then proceed with hormonal evaluation so please let me know if these continue.  Lastly we talked a lot about sleep hygiene today.  Working third shift makes this very difficult.  Try to implement these more general tips to help with your sleep.

## 2019-08-30 LAB — CBC WITH DIFFERENTIAL/PLATELET
Basophils Absolute: 0.1 10*3/uL (ref 0.0–0.2)
Basos: 1 %
EOS (ABSOLUTE): 0.1 10*3/uL (ref 0.0–0.4)
Eos: 1 %
Hematocrit: 40.9 % (ref 34.0–46.6)
Hemoglobin: 12.8 g/dL (ref 11.1–15.9)
Immature Grans (Abs): 0 10*3/uL (ref 0.0–0.1)
Immature Granulocytes: 0 %
Lymphocytes Absolute: 2.1 10*3/uL (ref 0.7–3.1)
Lymphs: 40 %
MCH: 25.7 pg — ABNORMAL LOW (ref 26.6–33.0)
MCHC: 31.3 g/dL — ABNORMAL LOW (ref 31.5–35.7)
MCV: 82 fL (ref 79–97)
Monocytes Absolute: 0.5 10*3/uL (ref 0.1–0.9)
Monocytes: 10 %
Neutrophils Absolute: 2.5 10*3/uL (ref 1.4–7.0)
Neutrophils: 48 %
Platelets: 287 10*3/uL (ref 150–450)
RBC: 4.99 x10E6/uL (ref 3.77–5.28)
RDW: 13.6 % (ref 11.7–15.4)
WBC: 5.2 10*3/uL (ref 3.4–10.8)

## 2019-08-30 LAB — TSH: TSH: 1.56 u[IU]/mL (ref 0.450–4.500)

## 2019-09-03 ENCOUNTER — Telehealth: Payer: Self-pay | Admitting: Family Medicine

## 2019-09-03 DIAGNOSIS — N921 Excessive and frequent menstruation with irregular cycle: Secondary | ICD-10-CM | POA: Insufficient documentation

## 2019-09-03 DIAGNOSIS — R5383 Other fatigue: Secondary | ICD-10-CM | POA: Insufficient documentation

## 2019-09-03 NOTE — Telephone Encounter (Signed)
Please let the patient know that her thyroid hormone and blood counts came back looking good today. I believe her delayed menstrual cycle and fatigue is likely due to her new third shift work schedule as we discussed in clinic. Can follow up for hormonal workup if does not have menstrual cycle for over 3 months also as we discussed.  Myrene Buddy MD PGY-3 Family Medicine Resident

## 2019-09-03 NOTE — Assessment & Plan Note (Signed)
Likely 2/2 large breasts. Patient requests referral to plastic surgery for breast reduction. Can continue taking tylenol, stretching, and topical liniments.  Referral placed to plastic surgery, Dr. Ulice Bold, for reduction evaluation.

## 2019-09-03 NOTE — Assessment & Plan Note (Signed)
A few days off cycle as compared to previous month. Will check tsh and cbc to rule out alternative causes of methorragia. Eating well, has not started any new, vigorous exercise. If symptoms persist for >3 months will then pursue hormonal workup. I suspect that her new sleep schedule is likely playing some role in as well. UPT negative in clinic.

## 2019-09-03 NOTE — Assessment & Plan Note (Signed)
Likely 2/2 new work schedule. Explained to patient that going to third shift is often a very difficult change for people. Recommended getting eye covers, continue blackout curtains and avoid coffee in the am. Explained that as best as she can try and develop a routine schedule. Can try melatonin as well.

## 2019-09-03 NOTE — Progress Notes (Signed)
HPI 19 year old who presents to discuss a late menstrual cycle and well as to discuss breast reduction surgery. She also mentions fatigue.  The patient is requesting breast reduction surgery as it is causing her chronic lower and thoracic back pain. She states that she has the back pain at essentially at times but it is much worse when she is standing. She is on her feet for around 8 hours per day for her job and describes a dull, aching back pain at the end of her shifts. Laying down helps the pain some. She has tried tylenol, ibuprofen, aleve, and several topical liniments. She has also tried a number of stretching exercises as well.  The patient states that she is roughly 6 days late for her menstrual cycle. He last menstrual cycle was 12/20. She was very regular prior to being placed on OCP in July and august and has been irregular since then. She stopped the OCPs because she is interested in becoming pregnant. Her most recent menstrual cycles have not been heavier than usual. She took two home pregnancy tests which were negative.  The patient recently started working third shift and states that she has had a lot of trouble adjusting. States that she is continuously tired and has difficulty falling asleep and staying asleep. Has tried blackout curtains. Usually drinks caffeine in the early morning.  CC: fatigue, late period, desire for breast reduction  ROS:   Review of Systems See HPI for ROS.   CC, SH/smoking status, and VS noted  Objective: BP 100/62   Pulse 65   Ht 5\' 4"  (1.626 m)   Wt 205 lb 4 oz (93.1 kg)   LMP 07/23/2019   SpO2 99%   BMI 35.23 kg/m  Gen: 19 year old AA female, no acute distress, resting comfortable Head: no pale conjunctiva noted. CV: rrr, no m/r/g Resp: lungs ctab Neuro: Alert and oriented, Speech clear, No gross deficits Back pain: tenderness to palpation bilateral lumbar back and thoracic back. Tenderness with flexion and extension in these  areas.   Assessment and plan:  Back pain Likely 2/2 large breasts. Patient requests referral to plastic surgery for breast reduction. Can continue taking tylenol, stretching, and topical liniments.  Referral placed to plastic surgery, Dr. 12, for reduction evaluation.  Metrorrhagia A few days off cycle as compared to previous month. Will check tsh and cbc to rule out alternative causes of methorragia. Eating well, has not started any new, vigorous exercise. If symptoms persist for >3 months will then pursue hormonal workup. I suspect that her new sleep schedule is likely playing some role in as well. UPT negative in clinic.  Fatigue Likely 2/2 new work schedule. Explained to patient that going to third shift is often a very difficult change for people. Recommended getting eye covers, continue blackout curtains and avoid coffee in the am. Explained that as best as she can try and develop a routine schedule. Can try melatonin as well.   Orders Placed This Encounter  Procedures  . TSH  . CBC with Differential  . Ambulatory referral to Plastic Surgery    Referral Priority:   Routine    Referral Type:   Surgical    Referral Reason:   Specialty Services Required    Referred to Provider:   Ulice Bold, DO    Requested Specialty:   Plastic Surgery    Number of Visits Requested:   1  . POCT urine pregnancy    No orders  of the defined types were placed in this encounter.    Guadalupe Dawn MD PGY-3 Family Medicine Resident  09/03/2019 2:10 PM

## 2019-09-04 NOTE — Telephone Encounter (Signed)
Attempted to call patient and there was no answer.  Mailbox was full.  Will try patient later.  Glennie Hawk, CMA

## 2019-09-05 NOTE — Telephone Encounter (Signed)
Informed Dr. Primitivo Gauze that patient could not be reached telephonically and a letter will be sent to patient's home instead.  Glennie Hawk, CMA

## 2019-09-06 ENCOUNTER — Ambulatory Visit (INDEPENDENT_AMBULATORY_CARE_PROVIDER_SITE_OTHER): Payer: Medicaid Other | Admitting: Plastic Surgery

## 2019-09-06 ENCOUNTER — Other Ambulatory Visit: Payer: Self-pay

## 2019-09-06 ENCOUNTER — Encounter: Payer: Self-pay | Admitting: Plastic Surgery

## 2019-09-06 VITALS — BP 111/74 | HR 68 | Temp 97.7°F | Ht 64.0 in | Wt 203.6 lb

## 2019-09-06 DIAGNOSIS — N62 Hypertrophy of breast: Secondary | ICD-10-CM

## 2019-09-06 NOTE — Progress Notes (Signed)
   Referring Provider Myrene Buddy, MD [address] [place],  [place] [address]   CC:  Chief Complaint  Patient presents with  . Advice Only    Patient here for consultation for back pain along with BL breast reduction      Angela Johnston is an 19 y.o. female.  HPI: Patient presents to discuss breast reduction.  She has had years of back pain neck pain and has taken anti-inflammatories for with minimal relief.  Is currently a triple D and wants to be a C.  She has no family history of breast cancer.  She denies any history of breast procedures or biopsies.  She has occasional skin irritation in the inframammary fold.  No Known Allergies  Outpatient Encounter Medications as of 09/06/2019  Medication Sig  . loratadine (CLARITIN) 10 MG tablet Take 1 tablet (10 mg total) by mouth daily.   No facility-administered encounter medications on file as of 09/06/2019.     No past medical history on file. Negative No past surgical history on file. Negative Family History  Problem Relation Age of Onset  . Healthy Mother   . Healthy Father     Social History   Social History Narrative  . Not on file  Denies tobacco use  Review of Systems General: Denies fevers, chills, weight loss CV: Denies chest pain, shortness of breath, palpitations  Physical Exam Vitals with BMI 09/06/2019 08/29/2019 08/01/2019  Height 5\' 4"  5\' 4"  -  Weight 203 lbs 10 oz 205 lbs 4 oz -  BMI 34.93 35.21 -  Systolic 111 100  Diastolic 74 62 68  Pulse 68 65 68    General:  No acute distress,  Alert and oriented, Non-Toxic, Normal speech and affect Breast: She has grade 3 ptosis.  Sternal notch to nipple is 35 cm on the right and 34 cm on the left.  Nipple to fold is 23 cm on the right and 22 cm on the left.  There is no scars or masses.  There is no lymphadenopathy.  Assessment/Plan The patient has bilateral symptomatic macromastia.  She is a good candidate for a breast reduction.  The details of  breast reduction surgery were discussed.  I explained the procedure in detail along the with the expected scars.  The risks were discussed in detail and include bleeding, infection, damage to surrounding structures, need for additional procedures, nipple loss, change in nipple sensation, persistent pain, contour irregularities and asymmetries.  I explained that breast feeding is often not possible after breast reduction surgery.  We discussed the expected postoperative course with an overall recovery period of about 1 month.  She demonstrated full understanding of all risks.  We discussed her personal risk factors that include inability to breast-feed in the future.  I anticipate approximately 700 g of tissue removed from each side.   09/06/2019, 11:06 AM

## 2019-10-16 ENCOUNTER — Ambulatory Visit: Payer: Managed Care, Other (non HMO) | Attending: Plastic Surgery | Admitting: Physical Therapy

## 2019-10-16 ENCOUNTER — Other Ambulatory Visit: Payer: Self-pay

## 2019-10-16 ENCOUNTER — Ambulatory Visit: Payer: Medicaid Other | Admitting: Family Medicine

## 2019-10-16 ENCOUNTER — Encounter: Payer: Self-pay | Admitting: Physical Therapy

## 2019-10-16 DIAGNOSIS — M545 Low back pain, unspecified: Secondary | ICD-10-CM

## 2019-10-16 DIAGNOSIS — M6281 Muscle weakness (generalized): Secondary | ICD-10-CM | POA: Diagnosis not present

## 2019-10-16 DIAGNOSIS — R293 Abnormal posture: Secondary | ICD-10-CM | POA: Diagnosis not present

## 2019-10-16 NOTE — Patient Instructions (Signed)
Access Code: [email or web address]: [email or web address]: 03/16/2021Prepared by: Victorino Dike PaaExercises  Supine Single Knee to Chest Stretch - 2 x daily - 7 x weekly - 1 sets - 3 reps - 30 hold  Supine Piriformis Stretch with Leg Straight - 2 x daily - 7 x weekly - 1 sets - 3 reps - 30 hold  Supine Lower Trunk Rotation - 2 x daily - 7 x weekly - 2 sets - 10 reps - 10 hold  Seated Hamstring Stretch - 2 x daily - 7 x weekly - 2 sets - 3 reps - 30 hold  Cat Cow - 1 x daily - 7 x weekly - 10 reps - 2 sets - 30 hold

## 2019-10-17 NOTE — Therapy (Signed)
[facility] [address] [place], [place], [address] Phone: [phone]   Fax:  [phone]  Physical Therapy Evaluation  Patient Details  Name: Angela Johnston MRN: [id number] Date of Birth: [birth year] No data recorded  Encounter Date: 10/16/2019  PT End of Session - 10/16/19 1604    Visit Number  1    Number of Visits  8    Date for PT Re-Evaluation  11/27/19    Authorization Type  MCD    PT Start Time  1525    PT Stop Time  1603    PT Time Calculation (min)  38 min    Activity Tolerance  Patient tolerated treatment well    Behavior During Therapy  [facility] for tasks assessed/performed       History reviewed. No pertinent past medical history.  History reviewed. No pertinent surgical history.  There were no vitals filed for this visit.   Subjective Assessment - 10/16/19 1529    Subjective  Patient complains of back pain for about 2-3 year, last been the worst in the past yr as she starting working ([employer]).  She noticed increased breast size when started birth control. She has difficulty when trying to sleep, sitting unsupported and standing long period.    How long can you sit comfortably?  depends on the chair    How long can you stand comfortably?  20 min    Currently in Pain?  Yes    Pain Score  9     Pain Location  Back    Pain Orientation  Upper;Mid;Lower    Pain Descriptors / Indicators  Sharp;Sore    Pain Type  Chronic pain    Pain Radiating Towards  no    Pain Onset  More than a month ago    Pain Frequency  Constant    Aggravating Factors   slouching, bending,  lifting    Pain Relieving Factors  being more upright, pillows behind her back    Effect of Pain on Daily Activities  working is difficult        Objective measurements completed on examination: See above findings.     PT Education - 10/16/19 1604    Education Details  PT/POC, HEP for posture, spinal mobility, hot pack    Person(s) Educated   Patient    Methods  Explanation;Demonstration;Handout    Comprehension  Verbalized understanding;Returned demonstration          PT Long Term Goals - 10/16/19 1624      PT LONG TERM GOAL #1   Title  Pt will be able to sit with corrected yet relaxed posture with min pain increase from baseline    Baseline  leans back with extension , uses hands to support    Time  6    Period  Weeks    Status  New    Target Date  11/27/19      PT LONG TERM GOAL #2   Title  Pt will be able to report increased comfort at night with sleep, positioning.    Baseline  pain is moderate to severe    Time  6    Period  Weeks    Status  New    Target Date  11/27/19      PT LONG TERM GOAL #3   Title  Pt will be able to lift light item from the floor to waist height with good form and no increased pain  Baseline  poor squat, pain without weight    Time  6    Period  Weeks    Status  New    Target Date  11/27/19      PT LONG TERM GOAL #4   Title  Pt will be I with HEP for posture and strength    Baseline  unknown, given on eval    Time  6    Period  Weeks    Status  New    Target Date  11/27/19             Plan - 10/16/19 1605    Clinical Impression Statement  [name] presents for eval (low complexity) of back pain (lower, middle and upper) which is chronic and worsening as time goes on. Apparently she quickly added weight and cup size when she started birth control. She has significant muscle guarding and spasm in paraspinals in entire back. She defaults to a position of extension which increases spasm.  She shows hip tightness and well, lacks core strength and has poor squat mechanics. We will try 4 weeks of PT to see if she can get some relief but I added that even if she has surgery, she will need to increase her mobility for maximal effort.    Personal Factors and Comorbidities  Age;Behavior Pattern;Comorbidity 1;Social Background    Comorbidities  obesity, macromastia     Examination-Activity Limitations  Bed Mobility;Locomotion Level;Stand;Lift;Bend;Transfers;Sleep;Squat;Sit;Carry    Examination-Participation Restrictions  Laundry;Shop;Cleaning;Community Activity;Interpersonal Relationship    Stability/Clinical Decision Making  Stable/Uncomplicated    Clinical Decision Making  Low    Rehab Potential  Excellent    PT Frequency  2x / week    PT Duration  4 weeks   may need increased time for scheduling   PT Treatment/Interventions  ADLs/Self Care Home Management;Electrical Stimulation;Therapeutic activities;Patient/family education;Therapeutic exercise;Taping;Manual techniques;Dry needling;Cryotherapy;Moist Heat;Neuromuscular re-education    PT Next Visit Plan  check HEP, consider tape to upper traps, rhomboid , manual to upper back    PT Home Exercise Plan  knee to chest, piriformis, lower trunk rotation cat/cow and seated hamstring    Consulted and Agree with Plan of Care  Patient       Patient will benefit from skilled therapeutic intervention in order to improve the following deficits and impairments:  Increased fascial restricitons, Improper body mechanics, Pain, Postural dysfunction, Increased muscle spasms, Decreased mobility, Decreased activity tolerance, Decreased range of motion, Decreased strength, Obesity, Impaired flexibility  Visit Diagnosis: Bilateral low back pain without sciatica, unspecified chronicity  Abnormal posture  Muscle weakness (generalized)     Problem List Patient Active Problem List   Diagnosis Date Noted  . Metrorrhagia 09/03/2019  . Fatigue 09/03/2019  . Cough 07/12/2019  . Birth control counseling 03/06/2019  . Back pain 03/06/2019  . 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 Allergic rhinitis 03/09/2012  . Intentional self-harm by sharp object ([place]) 02/08/2011    [name] 10/17/2019, 10:18 AM  [place], [place], [address] Phone:  [phone]   Fax:  [phone]  Name: Angela Johnston MRN: [id number] Date of Birth: 22-Jul-2001   Raeford Razor, PT 10/17/19 10:18 AM Phone: [phone] Fax: [phone]

## 2019-10-23 ENCOUNTER — Ambulatory Visit: Payer: Managed Care, Other (non HMO) | Admitting: Physical Therapy

## 2019-10-25 ENCOUNTER — Telehealth: Payer: Self-pay | Admitting: Physical Therapy

## 2019-10-25 ENCOUNTER — Ambulatory Visit: Payer: Managed Care, Other (non HMO) | Admitting: Physical Therapy

## 2019-10-25 NOTE — Telephone Encounter (Signed)
Spoke to patient regarding no how to appointment this morning. She forgot about the appointment. Confirmed next appointment date and time 11/05/19 at 12:15pm.

## 2019-11-01 ENCOUNTER — Encounter: Payer: Medicaid Other | Admitting: Physical Therapy

## 2019-11-05 ENCOUNTER — Ambulatory Visit: Payer: Managed Care, Other (non HMO) | Attending: Plastic Surgery | Admitting: Physical Therapy

## 2019-11-05 ENCOUNTER — Telehealth: Payer: Self-pay | Admitting: Physical Therapy

## 2019-11-05 NOTE — Telephone Encounter (Signed)
Patient with 2nd no show for PT appt. Called to discuss, unable to leave message on voicemail, full mailbox.  Will cancel remaining appts due to cancellation and no show policy.

## 2019-11-07 ENCOUNTER — Ambulatory Visit: Payer: Managed Care, Other (non HMO) | Admitting: Physical Therapy

## 2019-11-12 ENCOUNTER — Encounter: Payer: Medicaid Other | Admitting: Physical Therapy

## 2019-11-15 ENCOUNTER — Encounter: Payer: Medicaid Other | Admitting: Physical Therapy

## 2019-11-20 ENCOUNTER — Encounter: Payer: Medicaid Other | Admitting: Physical Therapy

## 2019-11-22 ENCOUNTER — Encounter: Payer: Medicaid Other | Admitting: Physical Therapy

## 2019-11-26 ENCOUNTER — Ambulatory Visit: Payer: Medicaid Other | Admitting: Family Medicine

## 2019-12-27 ENCOUNTER — Emergency Department (HOSPITAL_COMMUNITY)
Admission: EM | Admit: 2019-12-27 | Discharge: 2019-12-28 | Discharge status: Against Medical Advice | Payer: Medicaid Other | Attending: Emergency Medicine | Admitting: Emergency Medicine

## 2019-12-27 ENCOUNTER — Other Ambulatory Visit: Payer: Self-pay

## 2019-12-27 DIAGNOSIS — Z5321 Procedure and treatment not carried out due to patient leaving prior to being seen by health care provider: Secondary | ICD-10-CM | POA: Diagnosis not present

## 2019-12-27 DIAGNOSIS — J029 Acute pharyngitis, unspecified: Secondary | ICD-10-CM | POA: Insufficient documentation

## 2019-12-27 NOTE — ED Triage Notes (Signed)
Pt c/o sore throat after eating french fries yesterday. States difficulty swallowing.

## 2019-12-28 LAB — GROUP A STREP BY PCR: Group A Strep by PCR: NOT DETECTED

## 2019-12-28 NOTE — ED Notes (Signed)
Pt called for vitals, no answer 

## 2020-01-16 ENCOUNTER — Ambulatory Visit: Payer: Medicaid Other | Admitting: Family Medicine

## 2020-01-22 ENCOUNTER — Ambulatory Visit: Payer: Medicaid Other | Admitting: Family Medicine

## 2020-01-24 ENCOUNTER — Ambulatory Visit: Payer: Medicaid Other | Admitting: Family Medicine

## 2020-03-05 ENCOUNTER — Encounter: Payer: Self-pay | Admitting: Student in an Organized Health Care Education/Training Program

## 2020-03-05 ENCOUNTER — Other Ambulatory Visit: Payer: Self-pay

## 2020-03-05 ENCOUNTER — Ambulatory Visit (INDEPENDENT_AMBULATORY_CARE_PROVIDER_SITE_OTHER): Payer: Medicaid Other | Admitting: Student in an Organized Health Care Education/Training Program

## 2020-03-05 VITALS — BP 98/72 | HR 83 | Wt 212.8 lb

## 2020-03-05 DIAGNOSIS — N912 Amenorrhea, unspecified: Secondary | ICD-10-CM | POA: Diagnosis not present

## 2020-03-05 DIAGNOSIS — Z3169 Encounter for other general counseling and advice on procreation: Secondary | ICD-10-CM

## 2020-03-05 DIAGNOSIS — E6609 Other obesity due to excess calories: Secondary | ICD-10-CM

## 2020-03-05 DIAGNOSIS — Z6837 Body mass index (BMI) 37.0-37.9, adult: Secondary | ICD-10-CM

## 2020-03-05 DIAGNOSIS — R7989 Other specified abnormal findings of blood chemistry: Secondary | ICD-10-CM | POA: Diagnosis not present

## 2020-03-05 NOTE — Progress Notes (Signed)
   SUBJECTIVE:   CHIEF COMPLAINT / HPI: conception counseling.  Weight gain- 12 lbs. Increase since 1 year.  Activity: 30-42min walks on weekends. Works at [employer] ex and [employer]. No additional activities during the week. Diet: drinks 2 water bottles per day with occasional kool aid, sweet tea, starbucks drinks Usually eats 2-3 meals per day. No snacking. Not a lot of vegetables.   Desires Conceiving- last took birth control 03/2019. Her menstrual cycle is very irregular. Has had 5 cycles since then. Has never had a regular cycle. Sexually active with female partner about 2x/week. Does not have excessive hair on face or body and no striae  OBJECTIVE:   BP 98/72   Pulse 83   Wt 212 lb 12.8 oz (96.5 kg)   SpO2 96%   BMI 37.70 kg/m   General: NAD, pleasant, able to participate in exam Cardiac: RRR, normal heart sounds, no murmurs. 2+ radial and PT pulses bilaterally Respiratory: CTAB, normal effort, No wheezes, rales or rhonchi Abdomen: soft, nontender, nondistended, no hepatic or splenomegaly, +BS Extremities: no edema or cyanosis. WWP. Skin: warm and dry, no rashes noted. Acanthosis nigricans Neuro: alert and oriented x4, no focal deficits Psych: Normal affect and mood   ASSESSMENT/PLAN:   Obesity Counseled patient on effects of excess weight on pregnancy and on strategies to safely lose weight - provided patient with exercise handout - referral to nutritionist and instructed patient to call to initiate first appointment. Dr Gerilyn Pilgrim business card given  Pre-conception counseling >63yr of attempting to conceive without birth control. Frequency of intercourse 2/week. Patient has very irregular periods. Does have some risk factors of PCOS. Could consider [place] of ovaries. Discussed needing to regulate menstrual cycle to optimize conception - refer to OB/GYN - hormonal labs (TSH, prolactin, FSH, LH) - prolactin elevated. Brain MRI ordered - attempted to call and mychart message patient  with results and no answer     Leeroy Bock, DO [facility]

## 2020-03-05 NOTE — Patient Instructions (Signed)
It was a pleasure to see you today!  To summarize our discussion for this visit:  I have referred you to our nutritionist and giving you her contact information.  Please call her to set up an appointment.  I sent in a referral to OB/GYN to discuss regulating your menstrual cycle and then conception assistance  We are going to obtain some blood work today that will help give them some information to go off of  Some additional health maintenance measures we should update are: Health Maintenance Due  Topic Date Due  . Hepatitis C Screening  Never done  . COVID-19 Vaccine (1) Never done  . HIV Screening  Never done  . INFLUENZA VACCINE  03/02/2020  .    Call the clinic at [phone] if your symptoms worsen or you have any concerns.   Thank you for allowing me to take part in your care,  Dr. Jamelle Rushing   Exercising to Stay Healthy To become healthy and stay healthy, it is recommended that you do moderate-intensity and vigorous-intensity exercise. You can tell that you are exercising at a moderate intensity if your heart starts beating faster and you start breathing faster but can still hold a conversation. You can tell that you are exercising at a vigorous intensity if you are breathing much harder and faster and cannot hold a conversation while exercising. Exercising regularly is important. It has many health benefits, such as:  Improving overall fitness, flexibility, and endurance.  Increasing bone density.  Helping with weight control.  Decreasing body fat.  Increasing muscle strength.  Reducing stress and tension.  Improving overall health. How often should I exercise? Choose an activity that you enjoy, and set realistic goals. Your health care provider can help you make an activity plan that works for you. Exercise regularly as told by your health care provider. This may include:  Doing strength training two times a week, such as: ? Lifting weights. ? Using  resistance bands. ? Push-ups. ? Sit-ups. ? Yoga.  Doing a certain intensity of exercise for a given amount of time. Choose from these options: ? A total of 150 minutes of moderate-intensity exercise every week. ? A total of 75 minutes of vigorous-intensity exercise every week. ? A mix of moderate-intensity and vigorous-intensity exercise every week. Children, pregnant women, people who have not exercised regularly, people who are overweight, and older adults may need to talk with a health care provider about what activities are safe to do. If you have a medical condition, be sure to talk with your health care provider before you start a new exercise program. What are some exercise ideas? Moderate-intensity exercise ideas include:  Walking 1 mile (1.6 km) in about 15 minutes.  Biking.  Hiking.  Golfing.  Dancing.  Water aerobics. Vigorous-intensity exercise ideas include:  Walking 4.5 miles (7.2 km) or more in about 1 hour.  Jogging or running 5 miles (8 km) in about 1 hour.  Biking 10 miles (16.1 km) or more in about 1 hour.  Lap swimming.  Roller-skating or in-line skating.  Cross-country skiing.  Vigorous competitive sports, such as football, basketball, and soccer.  Jumping rope.  Aerobic dancing. What are some everyday activities that can help me to get exercise?  Yard work, such as: ? Pushing a Surveyor, mining. ? Raking and bagging leaves.  Washing your car.  Pushing a stroller.  Shoveling snow.  Gardening.  Washing windows or floors. How can I be more active in my day-to-day activities?  Use stairs instead of an elevator.  Take a walk during your lunch break.  If you drive, park your car farther away from your work or school.  If you take public transportation, get off one stop early and walk the rest of the way.  Stand up or walk around during all of your indoor phone calls.  Get up, stretch, and walk around every 30 minutes throughout the  day.  Enjoy exercise with a friend. Support to continue exercising will help you keep a regular routine of activity. What guidelines can I follow while exercising?  Before you start a new exercise program, talk with your health care provider.  Do not exercise so much that you hurt yourself, feel dizzy, or get very short of breath.  Wear comfortable clothes and wear shoes with good support.  Drink plenty of water while you exercise to prevent dehydration or heat stroke.  Work out until your breathing and your heartbeat get faster. Where to find more information  U.S. Department of Health and Human Services: ThisPath.fi  Centers for Disease Control and Prevention (CDC): FootballExhibition.com.br Summary  Exercising regularly is important. It will improve your overall fitness, flexibility, and endurance.  Regular exercise also will improve your overall health. It can help you control your weight, reduce stress, and improve your bone density.  Do not exercise so much that you hurt yourself, feel dizzy, or get very short of breath.  Before you start a new exercise program, talk with your health care provider. This information is not intended to replace advice given to you by your health care provider. Make sure you discuss any questions you have with your health care provider. Document Revised: 07/01/2017 Document Reviewed: 06/09/2017 Elsevier Patient Education  2020 [employer].

## 2020-03-05 NOTE — Progress Notes (Deleted)
    SUBJECTIVE:   CHIEF COMPLAINT / HPI: check up  ***  PERTINENT  PMH / PSH: ***  OBJECTIVE:   BP 98/72   Pulse 83   Wt 212 lb 12.8 oz (96.5 kg)   SpO2 96%   BMI 37.70 kg/m   ***  ASSESSMENT/PLAN:   No problem-specific Assessment & Plan notes found for this encounter.     Leeroy Bock, DO [facility]

## 2020-03-06 ENCOUNTER — Encounter: Payer: Self-pay | Admitting: Student in an Organized Health Care Education/Training Program

## 2020-03-06 ENCOUNTER — Telehealth: Payer: Self-pay

## 2020-03-06 LAB — PROLACTIN: Prolactin: 50.2 ng/mL — ABNORMAL HIGH (ref 4.8–23.3)

## 2020-03-06 LAB — LUTEINIZING HORMONE: LH: <0.3 m[IU]/mL

## 2020-03-06 LAB — FOLLICLE STIMULATING HORMONE: FSH: <0.3 m[IU]/mL

## 2020-03-06 NOTE — Telephone Encounter (Signed)
Patient calls nurse requesting lab results. Patient reports she saw them on mychart and wants to discuss plan with PCP. Please advise.

## 2020-03-13 DIAGNOSIS — Z3169 Encounter for other general counseling and advice on procreation: Secondary | ICD-10-CM | POA: Insufficient documentation

## 2020-03-13 DIAGNOSIS — O99213 Obesity complicating pregnancy, third trimester: Secondary | ICD-10-CM | POA: Insufficient documentation

## 2020-03-13 DIAGNOSIS — R7989 Other specified abnormal findings of blood chemistry: Secondary | ICD-10-CM | POA: Insufficient documentation

## 2020-03-13 DIAGNOSIS — E669 Obesity, unspecified: Secondary | ICD-10-CM | POA: Insufficient documentation

## 2020-03-13 NOTE — Assessment & Plan Note (Addendum)
>  16yr of attempting to conceive without birth control. Frequency of intercourse 2/week. Patient has very irregular periods. Does have some risk factors of PCOS. Could consider [place] of ovaries. Discussed needing to regulate menstrual cycle to optimize conception - refer to OB/GYN - hormonal labs (TSH, prolactin, FSH, LH) - prolactin elevated. Brain MRI ordered - attempted to call and mychart message patient with results and no answer

## 2020-03-13 NOTE — Assessment & Plan Note (Signed)
Counseled patient on effects of excess weight on pregnancy and on strategies to safely lose weight - provided patient with exercise handout - referral to nutritionist and instructed patient to call to initiate first appointment. Dr Gerilyn Pilgrim business card given

## 2020-03-17 ENCOUNTER — Telehealth: Payer: Self-pay | Admitting: *Deleted

## 2020-03-17 NOTE — Telephone Encounter (Signed)
Pt informed and scheduled. Jedd Schulenburg, CMA  

## 2020-03-22 ENCOUNTER — Ambulatory Visit (HOSPITAL_COMMUNITY)
Admission: RE | Admit: 2020-03-22 | Discharge: 2020-03-22 | Disposition: A | Discharge status: Home / Self Care | Payer: Medicaid Other | Source: Ambulatory Visit | Attending: Family Medicine | Admitting: Family Medicine

## 2020-03-22 ENCOUNTER — Other Ambulatory Visit: Payer: Self-pay

## 2020-03-22 DIAGNOSIS — R7989 Other specified abnormal findings of blood chemistry: Secondary | ICD-10-CM | POA: Diagnosis not present

## 2020-03-22 MED ORDER — GADOBUTROL 1 MMOL/ML IV SOLN
9.0000 mL | Freq: Once | INTRAVENOUS | Status: AC | PRN
  Administered 2020-03-22: 9 mL via INTRAVENOUS

## 2020-03-26 ENCOUNTER — Telehealth: Payer: Self-pay | Admitting: Student in an Organized Health Care Education/Training Program

## 2020-03-26 NOTE — Telephone Encounter (Signed)
Patient is calling and would like for Dr. Dareen Piano to call her to discuss her MRI results. She said she received the results but does not understand them.   The best call back number is [phone]

## 2020-03-28 ENCOUNTER — Ambulatory Visit: Payer: Medicaid Other | Admitting: Family Medicine

## 2020-04-01 ENCOUNTER — Encounter: Payer: Self-pay | Admitting: Student in an Organized Health Care Education/Training Program

## 2020-04-01 ENCOUNTER — Other Ambulatory Visit: Payer: Self-pay

## 2020-04-01 ENCOUNTER — Ambulatory Visit (INDEPENDENT_AMBULATORY_CARE_PROVIDER_SITE_OTHER): Payer: Medicaid Other | Admitting: Family Medicine

## 2020-04-01 VITALS — BP 108/60 | HR 75 | Wt 209.6 lb

## 2020-04-01 DIAGNOSIS — Z3490 Encounter for supervision of normal pregnancy, unspecified, unspecified trimester: Secondary | ICD-10-CM

## 2020-04-01 DIAGNOSIS — E221 Hyperprolactinemia: Secondary | ICD-10-CM | POA: Diagnosis not present

## 2020-04-01 DIAGNOSIS — Z3201 Encounter for pregnancy test, result positive: Secondary | ICD-10-CM | POA: Diagnosis not present

## 2020-04-01 DIAGNOSIS — Z3493 Encounter for supervision of normal pregnancy, unspecified, third trimester: Secondary | ICD-10-CM | POA: Insufficient documentation

## 2020-04-01 DIAGNOSIS — N912 Amenorrhea, unspecified: Secondary | ICD-10-CM | POA: Diagnosis present

## 2020-04-01 LAB — POCT URINALYSIS DIP (MANUAL ENTRY)
Bilirubin, UA: NEGATIVE
Blood, UA: NEGATIVE
Glucose, UA: NEGATIVE mg/dL
Ketones, POC UA: NEGATIVE mg/dL
Leukocytes, UA: NEGATIVE
Nitrite, UA: NEGATIVE
Protein Ur, POC: NEGATIVE mg/dL
Spec Grav, UA: 1.02 (ref 1.010–1.025)
Urobilinogen, UA: 0.2 E.U./dL
pH, UA: 6 (ref 5.0–8.0)

## 2020-04-01 LAB — POCT URINE PREGNANCY: Preg Test, Ur: POSITIVE — AB

## 2020-04-01 NOTE — Patient Instructions (Addendum)
It was a pleasure to meet you today. Congratulations on your new pregnancy. We have called to schedule a dating ultrasound to determine how far along you are.  The dating ultrasound has been scheduled for [facility]/3 and [facility]  You have been scheduled for an initial OB exam on 9/10 with Dr. Dareen Piano.  You are going to collect initial OB labs on you today.  Please start taking prenatal vitamins today and continue to take them throughout this pregnancy.  If you have any questions or concerns between now and then please call the clinic and schedule an appointment.  I hope you have a wonderful afternoon!   Prenatal Care Prenatal care is health care during pregnancy. It helps you and your unborn baby (fetus) stay as healthy as possible. Prenatal care may be provided by a midwife, a family practice health care provider, or a childbirth and pregnancy specialist (obstetrician). How does this affect me? During pregnancy, you will be closely monitored for any new conditions that might develop. To lower your risk of pregnancy complications, you and your health care provider will talk about any underlying conditions you have. How does this affect my baby? Early and consistent prenatal care increases the chance that your baby will be healthy during pregnancy. Prenatal care lowers the risk that your baby will be:  Born early (prematurely).  Smaller than expected at birth (small for gestational age). What can I expect at the first prenatal care visit? Your first prenatal care visit will likely be the longest. You should schedule your first prenatal care visit as soon as you know that you are pregnant. Your first visit is a good time to talk about any questions or concerns you have about pregnancy. At your visit, you and your health care provider will talk about:  Your medical history, including: ? Any past pregnancies. ? Your family's medical history. ? The baby's father's medical history. ? Any  long-term (chronic) health conditions you have and how you manage them. ? Any surgeries or procedures you have had. ? Any current over-the-counter or prescription medicines, herbs, or supplements you are taking.  Other factors that could pose a risk to your baby, including:  Your home setting and your stress levels, including: ? Exposure to abuse or violence. ? Household financial strain. ? Mental health conditions you have.  Your daily health habits, including diet and exercise. Your health care provider will also:  Measure your weight, height, and blood pressure.  Do a physical exam, including a pelvic and breast exam.  Perform blood tests and urine tests to check for: ? Urinary tract infection. ? Sexually transmitted infections (STIs). ? Low iron levels in your blood (anemia). ? Blood type and certain proteins on red blood cells (Rh antibodies). ? Infections and immunity to viruses, such as hepatitis B and rubella. ? HIV (human immunodeficiency virus).  Do an ultrasound to confirm your baby's growth and development and to help predict your estimated due date (EDD). This ultrasound is done with a probe that is inserted into the vagina (transvaginal ultrasound).  Discuss your options for genetic screening.  Give you information about how to keep yourself and your baby healthy, including: ? Nutrition and taking vitamins. ? Physical activity. ? How to manage pregnancy symptoms such as nausea and vomiting (morning sickness). ? Infections and substances that may be harmful to your baby and how to avoid them. ? Food safety. ? Dental care. ? Working. ? Travel. ? Warning signs to  watch for and when to call your health care provider. How often will I have prenatal care visits? After your first prenatal care visit, you will have regular visits throughout your pregnancy. The visit schedule is often as follows:  Up to week 28 of pregnancy: once every 4 weeks.  28-36 weeks: once  every 2 weeks.  After 36 weeks: every week until delivery. Some women may have visits more or less often depending on any underlying health conditions and the health of the baby. Keep all follow-up and prenatal care visits as told by your health care provider. This is important. What happens during routine prenatal care visits? Your health care provider will:  Measure your weight and blood pressure.  Check for fetal heart sounds.  Measure the height of your uterus in your abdomen (fundal height). This may be measured starting around week 20 of pregnancy.  Check the position of your baby inside your uterus.  Ask questions about your diet, sleeping patterns, and whether you can feel the baby move.  Review warning signs to watch for and signs of labor.  Ask about any pregnancy symptoms you are having and how you are dealing with them. Symptoms may include: ? Headaches. ? Nausea and vomiting. ? Vaginal discharge. ? Swelling. ? Fatigue. ? Constipation. ? Any discomfort, including back or pelvic pain. Make a list of questions to ask your health care provider at your routine visits. What tests might I have during prenatal care visits? You may have blood, urine, and imaging tests throughout your pregnancy, such as:  Urine tests to check for glucose, protein, or signs of infection.  Glucose tests to check for a form of diabetes that can develop during pregnancy (gestational diabetes mellitus). This is usually done around week 24 of pregnancy.  An ultrasound to check your baby's growth and development and to check for birth defects. This is usually done around week 20 of pregnancy.  A test to check for group B strep (GBS) infection. This is usually done around week 36 of pregnancy.  Genetic testing. This may include blood or imaging tests, such as an ultrasound. Some genetic tests are done during the first trimester and some are done during the second trimester. What else can I expect  during prenatal care visits? Your health care provider may recommend getting certain vaccines during pregnancy. These may include:  A yearly flu shot (annual influenza vaccine). This is especially important if you will be pregnant during flu season.  Tdap (tetanus, diphtheria, pertussis) vaccine. Getting this vaccine during pregnancy can protect your baby from whooping cough (pertussis) after birth. This vaccine may be recommended between weeks 27 and 36 of pregnancy. Later in your pregnancy, your health care provider may give you information about:  Childbirth and breastfeeding classes.  Choosing a health care provider for your baby.  Umbilical cord banking.  Breastfeeding.  Birth control after your baby is born.  The hospital labor and delivery unit and how to tour it.  Registering at the hospital before you go into labor. Where to find more information  Office on Women's Health: TravelLesson.ca  American Pregnancy Association: americanpregnancy.org  March of Dimes: marchofdimes.org Summary  Prenatal care helps you and your baby stay as healthy as possible during pregnancy.  Your first prenatal care visit will most likely be the longest.  You will have visits and tests throughout your pregnancy to monitor your health and your baby's health.  Bring a list of questions to your visits to ask  your health care provider.  Make sure to keep all follow-up and prenatal care visits with your health care provider. This information is not intended to replace advice given to you by your health care provider. Make sure you discuss any questions you have with your health care provider. Document Revised: 11/08/2018 Document Reviewed: 07/18/2017 Elsevier Patient Education  2020 [employer].

## 2020-04-01 NOTE — Progress Notes (Signed)
    SUBJECTIVE:   CHIEF COMPLAINT / HPI:   Amenorrhea Patient reports continued amenorrhea since her previous visit.  She reports that she was on birth control until August 2020 when she discontinued the birth control because she was not having menses.  Patient reports 2 days of spotting from May 15-16 and has not had any spotting or bleeding since that time.  She reports she has taken several pregnancy tests previously in the year that came back negative but has not taken one recently.  Patient is sexually active and does not use protection.  She was seen and evaluated in early August and lab work was collected with an FSH, LH, prolactin and the prolactin was elevated.  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    OBJECTIVE:   BP 108/60   Pulse 75   Wt 209 lb 9.6 oz (95.1 kg)   LMP 04/02/2019 (Approximate)   SpO2 99%   BMI 37.13 kg/m   General: Well-appearing 19 year old female, no acute distress Respiratory: Normal work of breathing, lungs clear to auscultation bilaterally Cardiac: Regular rate and rhythm, no murmurs appreciated Abdomen: Soft, palpable uterus just below the umbilicus, nontender, positive bowel sounds Extremities: No edema noted  Given palpable uterus along with the fact that the patient is amenorrheic and sexually active without using protection a urine pregnancy test was collected which came back positive.  Uterine fundal height is approximately 20 cm.  Fetal heart monitor showed heart rate of 130s.  ASSESSMENT/PLAN:   Pregnancy Patient presented for concerns due to amenorrhea.  Given amenorrhea with sexual activity without contraception urine pregnancy test was collected which came back positive.  Patient was initially upset but after further discussion she became happy and says that she  will discuss this with her aunt before discussing it with her mother and grandmother.  She does note that her partner has said he wanted to start a family.  It is difficult to determine dating given the patient has been amenorrheic for the last year.  Physical exam suggest patient is approximately 20 weeks but dating ultrasound is a necessity. -Initial prenatal labs were collected -Dating and anatomy scan were scheduled -Patient was instructed to start taking prenatal vitamins -Patient was scheduled for initial prenatal visit with Dr. Dareen Piano after her ultrasound has been completed -Precautions for seeking medical attention were given regarding vaginal bleeding, loss of fluid, contractions.   Derrel Nip, MD [facility]

## 2020-04-01 NOTE — Assessment & Plan Note (Addendum)
Patient presented for concerns due to amenorrhea.  Given amenorrhea with sexual activity without contraception urine pregnancy test was collected which came back positive.  Patient was initially upset but after further discussion she became happy and says that she will discuss this with her aunt before discussing it with her mother and grandmother.  She does note that her partner has said he wanted to start a family.  It is difficult to determine dating given the patient has been amenorrheic for the last year.  Physical exam suggest patient is approximately 20 weeks but dating ultrasound is a necessity. -Initial prenatal labs were collected -Dating and anatomy scan were scheduled -Patient was instructed to start taking prenatal vitamins -Patient was scheduled for initial prenatal visit with Dr. Dareen Piano after her ultrasound has been completed -Precautions for seeking medical attention were given regarding vaginal bleeding, loss of fluid, contractions.

## 2020-04-02 LAB — OBSTETRIC PANEL, INCLUDING HIV
Antibody Screen: NEGATIVE
Basophils Absolute: 0 10*3/uL (ref 0.0–0.2)
Basos: 1 %
EOS (ABSOLUTE): 0 10*3/uL (ref 0.0–0.4)
Eos: 1 %
HIV Screen 4th Generation wRfx: NONREACTIVE
Hematocrit: 37.1 % (ref 34.0–46.6)
Hemoglobin: 11.9 g/dL (ref 11.1–15.9)
Hepatitis B Surface Ag: NEGATIVE
Immature Grans (Abs): 0.1 10*3/uL (ref 0.0–0.1)
Immature Granulocytes: 1 %
Lymphocytes Absolute: 2 10*3/uL (ref 0.7–3.1)
Lymphs: 25 %
MCH: 26 pg — ABNORMAL LOW (ref 26.6–33.0)
MCHC: 32.1 g/dL (ref 31.5–35.7)
MCV: 81 fL (ref 79–97)
Monocytes Absolute: 0.6 10*3/uL (ref 0.1–0.9)
Monocytes: 8 %
Neutrophils Absolute: 5.3 10*3/uL (ref 1.4–7.0)
Neutrophils: 64 %
Platelets: 250 10*3/uL (ref 150–450)
RBC: 4.57 x10E6/uL (ref 3.77–5.28)
RDW: 14.9 % (ref 11.7–15.4)
RPR Ser Ql: NONREACTIVE
Rh Factor: POSITIVE
Rubella Antibodies, IGG: 9.32 index (ref >0.99)
WBC: 8.1 10*3/uL (ref 3.4–10.8)

## 2020-04-03 LAB — CULTURE, OB URINE

## 2020-04-03 LAB — URINE CULTURE, OB REFLEX

## 2020-04-04 ENCOUNTER — Other Ambulatory Visit: Payer: Self-pay

## 2020-04-04 ENCOUNTER — Ambulatory Visit: Payer: Medicaid Other | Attending: Advanced Practice Midwife

## 2020-04-04 ENCOUNTER — Telehealth: Payer: Self-pay

## 2020-04-04 ENCOUNTER — Ambulatory Visit: Payer: Medicaid Other

## 2020-04-04 ENCOUNTER — Other Ambulatory Visit: Payer: Self-pay | Admitting: *Deleted

## 2020-04-04 DIAGNOSIS — Z3687 Encounter for antenatal screening for uncertain dates: Secondary | ICD-10-CM | POA: Diagnosis not present

## 2020-04-04 DIAGNOSIS — E669 Obesity, unspecified: Secondary | ICD-10-CM | POA: Diagnosis not present

## 2020-04-04 DIAGNOSIS — Z3490 Encounter for supervision of normal pregnancy, unspecified, unspecified trimester: Secondary | ICD-10-CM | POA: Diagnosis present

## 2020-04-04 DIAGNOSIS — Z3A15 15 weeks gestation of pregnancy: Secondary | ICD-10-CM

## 2020-04-04 DIAGNOSIS — Z363 Encounter for antenatal screening for malformations: Secondary | ICD-10-CM

## 2020-04-04 DIAGNOSIS — O99212 Obesity complicating pregnancy, second trimester: Secondary | ICD-10-CM

## 2020-04-04 DIAGNOSIS — Z362 Encounter for other antenatal screening follow-up: Secondary | ICD-10-CM

## 2020-04-04 NOTE — Telephone Encounter (Signed)
Patient calls nurse line reporting headaches. Patient reports the headaches are new to her since finding out she is pregnant. Patient reports they happen at least once a day and "take over my whole head." Patient has an apt next week, however she is requesting any safe pregnancy meds for headaches. Patient denies any vision changes or hx of blood pressure issues. Please advise.

## 2020-04-07 ENCOUNTER — Other Ambulatory Visit (HOSPITAL_COMMUNITY): Payer: Self-pay | Admitting: Student in an Organized Health Care Education/Training Program

## 2020-04-11 ENCOUNTER — Other Ambulatory Visit: Payer: Self-pay

## 2020-04-11 ENCOUNTER — Ambulatory Visit (INDEPENDENT_AMBULATORY_CARE_PROVIDER_SITE_OTHER): Payer: Medicaid Other | Admitting: Student in an Organized Health Care Education/Training Program

## 2020-04-11 VITALS — BP 96/56 | HR 110 | Wt 213.2 lb

## 2020-04-11 DIAGNOSIS — Z3A16 16 weeks gestation of pregnancy: Secondary | ICD-10-CM

## 2020-04-11 NOTE — Patient Instructions (Signed)
It was a pleasure to see you today!  To summarize our discussion for this visit:  Your labwork so far is within normal range. We will complete more screening today.  Please continue to take your prenatal vitamins and follow up with [place] in 4 weeks or sooner if you need anything.   You have another [place] scheduled for Oct 1st.   At your next visit, please plan to have STD screening and glucola test for diabetes screening.  Some additional health maintenance measures we should update are: Health Maintenance Due  Topic Date Due  . Hepatitis C Screening  Never done  .    Call the clinic at [phone] if your symptoms worsen or you have any concerns.   Thank you for allowing me to take part in your care,  Dr. Jamelle Rushing   First Trimester of Pregnancy  The first trimester of pregnancy is from week 1 until the end of week 13 (months 1 through 3). During this time, your baby will begin to develop inside you. At 6-8 weeks, the eyes and face are formed, and the heartbeat can be seen on ultrasound. At the end of 12 weeks, all the baby's organs are formed. Prenatal care is all the medical care you receive before the birth of your baby. Make sure you get good prenatal care and follow all of your doctor's instructions. Follow these instructions at home: Medicines  Take over-the-counter and prescription medicines only as told by your doctor. Some medicines are safe and some medicines are not safe during pregnancy.  Take a prenatal vitamin that contains at least 600 micrograms (mcg) of folic acid.  If you have trouble pooping (constipation), take medicine that will make your stool soft (stool softener) if your doctor approves. Eating and drinking   Eat regular, healthy meals.  Your doctor will tell you the amount of weight gain that is right for you.  Avoid raw meat and uncooked cheese.  If you feel sick to your stomach (nauseous) or throw up (vomit): ? Eat 4 or 5 small meals a day  instead of 3 large meals. ? Try eating a few soda crackers. ? Drink liquids between meals instead of during meals.  To prevent constipation: ? Eat foods that are high in fiber, like fresh fruits and vegetables, whole grains, and beans. ? Drink enough fluids to keep your pee (urine) clear or pale yellow. Activity  Exercise only as told by your doctor. Stop exercising if you have cramps or pain in your lower belly (abdomen) or low back.  Do not exercise if it is too hot, too humid, or if you are in a place of great height (high altitude).  Try to avoid standing for long periods of time. Move your legs often if you must stand in one place for a long time.  Avoid heavy lifting.  Wear low-heeled shoes. Sit and stand up straight.  You can have sex unless your doctor tells you not to. Relieving pain and discomfort  Wear a good support bra if your breasts are sore.  Take warm water baths (sitz baths) to soothe pain or discomfort caused by hemorrhoids. Use hemorrhoid cream if your doctor says it is okay.  Rest with your legs raised if you have leg cramps or low back pain.  If you have puffy, bulging veins (varicose veins) in your legs: ? Wear support hose or compression stockings as told by your doctor. ? Raise (elevate) your feet for 15 minutes, 3-4 times  a day. ? Limit salt in your food. Prenatal care  Schedule your prenatal visits by the twelfth week of pregnancy.  Write down your questions. Take them to your prenatal visits.  Keep all your prenatal visits as told by your doctor. This is important. Safety  Wear your seat belt at all times when driving.  Make a list of emergency phone numbers. The list should include numbers for family, friends, the hospital, and police and fire departments. General instructions  Ask your doctor for a referral to a local prenatal class. Begin classes no later than at the start of month 6 of your pregnancy.  Ask for help if you need counseling  or if you need help with nutrition. Your doctor can give you advice or tell you where to go for help.  Do not use hot tubs, steam rooms, or saunas.  Do not douche or use tampons or scented sanitary pads.  Do not cross your legs for long periods of time.  Avoid all herbs and alcohol. Avoid drugs that are not approved by your doctor.  Do not use any tobacco products, including cigarettes, chewing tobacco, and electronic cigarettes. If you need help quitting, ask your doctor. You may get counseling or other support to help you quit.  Avoid cat litter boxes and soil used by cats. These carry germs that can cause birth defects in the baby and can cause a loss of your baby (miscarriage) or stillbirth.  Visit your dentist. At home, brush your teeth with a soft toothbrush. Be gentle when you floss. Contact a doctor if:  You are dizzy.  You have mild cramps or pressure in your lower belly.  You have a nagging pain in your belly area.  You continue to feel sick to your stomach, you throw up, or you have watery poop (diarrhea).  You have a bad smelling fluid coming from your vagina.  You have pain when you pee (urinate).  You have increased puffiness (swelling) in your face, hands, legs, or ankles. Get help right away if:  You have a fever.  You are leaking fluid from your vagina.  You have spotting or bleeding from your vagina.  You have very bad belly cramping or pain.  You gain or lose weight rapidly.  You throw up blood. It may look like coffee grounds.  You are around people who have [place] measles, fifth disease, or chickenpox.  You have a very bad headache.  You have shortness of breath.  You have any kind of trauma, such as from a fall or a car accident. Summary  The first trimester of pregnancy is from week 1 until the end of week 13 (months 1 through 3).  To take care of yourself and your unborn baby, you will need to eat healthy meals, take medicines only if  your doctor tells you to do so, and do activities that are safe for you and your baby.  Keep all follow-up visits as told by your doctor. This is important as your doctor will have to ensure that your baby is healthy and growing well. This information is not intended to replace advice given to you by your health care provider. Make sure you discuss any questions you have with your health care provider. Document Revised: 11/09/2018 Document Reviewed: 07/27/2016 Elsevier Patient Education  2020 [employer].

## 2020-04-11 NOTE — Progress Notes (Signed)
Patient Name: Angela Johnston Date of Birth: 01-24-2001 [facility] Initial Prenatal Visit  Angela Johnston is a 19 y.o. year old G1P0 at [redacted]w[redacted]d who presents for her initial prenatal visit. Pregnancy is not planned She reports difficulty finding comfortable sleeping position.. She is taking a prenatal vitamin.  She denies pelvic pain or vaginal bleeding.   Pregnancy Dating: . The patient is dated by 1st trimester [place].  Marland Kitchen LMP: uncertain . Periods were regular:  No.  . LMP was a typical period:  No.  . Using hormonal contraception in 3 months prior to conception: No  Lab Review: . Blood type: B . Rh Status: + . Antibody screen: Negative . HIV: Negative . RPR: Negative . Hemoglobin electrophoresis reviewed: Yes . Results of OB urine culture are: Negative . Rubella: Immune . Hep B surface Ag: Negative . Varicella status is Unknown  PMH: Reviewed and as detailed below: . HTN: No  . Type 1 or 2 Diabetes: No  . Depression:  No  . Seizure disorder:  No . VTE: No ,  . History of STI No,  . Abnormal Pap smear:  No, . Genital herpes simplex:  No   PSH: . Gynecologic Surgery:  no . Surgical history reviewed, notable for: none  Obstetric History: . Obstetric history tab updated and reviewed.  Summary of prior pregnancies: NA. G1  Social History: . Partner's name: Angela Johnston. Support person for delivery and FOB. . No known medical history in FOB.  . Tobacco use: No . Alcohol use:  No . Other substance use:  No  Current Medications:  . Prenatal vitamin  . Reviewed and appropriate in pregnancy.   Genetic and Infection Screen: . Flow Sheet Updated Yes  Prenatal Exam: Blood pressure (!) 96/56, pulse (!) 110, weight 213 lb 4 oz (96.7 kg), last menstrual period 07/26/2019. Recheck HR 85.  Gen: Well nourished, well developed, obese.  No distress.  Vitals noted. HEENT: Normocephalic, atraumatic.  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 without thyromegaly or thyroid nodules.  Fair dentition. CV: RRR no murmur, gallops or rubs Lungs: CTAB.  Normal respiratory effort without wheezes or rales. Abd: soft, NTND. +BS. Ext: No clubbing, cyanosis or edema. Psych: Normal grooming and dress.  Not depressed or anxious appearing.  Normal thought content and process without flight of ideas or looseness of associations  Fetal heart tones: Appropriate  Assessment/Plan:  Angela Johnston is a 19 y.o. G1P0 at [redacted]w[redacted]d who presents to initiate prenatal care. She is doing well.  Current pregnancy issues include uncomfortable sleeping positions.  1. Routine prenatal care: Marland Kitchen As dating is not reliable, a dating ultrasound has been completed. Dating tab updated. . Pre-pregnancy weight updated. Expected weight gain this pregnancy is 15-25 pounds . Prenatal labs reviewed, notable for missing varicella and hep C. . Indications for referral to [facility] were reviewed and the patient does not meet criteria for referral.  . Medication list reviewed and updated.  . Recommended patient see a dentist for regular care.  . Bleeding and pain precautions reviewed. . Importance of prenatal vitamins reviewed.  . Genetic screening offered. Patient opted for: quad screen at 16-20 weeks. . The patient will not be age 59 or over at time of delivery. Referral to genetic counseling was not offered today.  . The patient has the following risk factors for preexisting diabetes: BMI > 25 and high risk ethnicity (Latino, African American,  Native American, [place] Islander, Asian American) . An early 1 hour glucose tolerance test was not ordered today due to time restraints but the patient was made aware and will complete at next visit. . Pregnancy Medical Home and PHQ-9 forms completed, problems noted: No completed.  2. Pregnancy issues include the following which were addressed today:  . Varicella, hep C screen, quad screen  today . Anticipatory guidance given in check out paperwork . Patient qualifies for early GDM screening and will complete at next visit as well as pelvic exam.  . Recheck anterior cervical lymph nodes on R at f/u visit to monitor for resolution . F/u anatomy scan already scheduled.    Follow up 4 weeks for next prenatal visit.

## 2020-04-13 LAB — AFP TETRA
DIA Mom Value: 1.13
DIA Value (EIA): 146.75 pg/mL
DSR (By Age)    1 IN: 1153
DSR (Second Trimester) 1 IN: 10000
Gestational Age: 16 WEEKS
MSAFP Mom: 1.46
MSAFP: 44.6 ng/mL
MSHCG Mom: 0.87
MSHCG: 28612 m[IU]/mL
Maternal Age At EDD: 20.7 yr
Osb Risk: 6095
T18 (By Age): 1:4493 {titer}
Test Results:: NEGATIVE
Weight: 213 [lb_av]
uE3 Mom: 1.14
uE3 Value: 0.99 ng/mL

## 2020-04-14 LAB — VARICELLA ZOSTER ABS, IGG/IGM
Varicella IgM: <0.91 index (ref 0.00–0.90)
Varicella zoster IgG: <135 index — ABNORMAL LOW (ref >165)

## 2020-04-14 LAB — HEPATITIS C ANTIBODY: Hep C Virus Ab: <0.1 s/co ratio (ref 0.0–0.9)

## 2020-04-24 ENCOUNTER — Telehealth: Payer: Self-pay

## 2020-04-24 NOTE — Telephone Encounter (Signed)
Patient calls nurse line regarding nasal congestion and mild cough. Patient denies fever, sick contacts or known COVID exposure.   Spoke with Dr. Barb Merino regarding patient. Advised that patient could use OTC saline nasal spray to help with nasal congestion, drink warm tea with honey to help with cough and that patient can take 650 mg tylenol every 6 hours. Encouraged increasing PO intake, vitamin C and using a humidifier when sleeping.   Patient verbalizes understanding. Answered all questions.   Strict return precautions given.   FYI to PCP  Veronda Prude, RN

## 2020-04-27 DIAGNOSIS — R0981 Nasal congestion: Secondary | ICD-10-CM | POA: Diagnosis not present

## 2020-04-27 DIAGNOSIS — R05 Cough: Secondary | ICD-10-CM | POA: Diagnosis not present

## 2020-04-27 DIAGNOSIS — Z20822 Contact with and (suspected) exposure to covid-19: Secondary | ICD-10-CM | POA: Diagnosis not present

## 2020-05-02 ENCOUNTER — Other Ambulatory Visit: Payer: Self-pay

## 2020-05-02 ENCOUNTER — Ambulatory Visit: Payer: Medicaid Other | Attending: Obstetrics and Gynecology

## 2020-05-02 ENCOUNTER — Encounter: Payer: Self-pay | Admitting: *Deleted

## 2020-05-02 ENCOUNTER — Ambulatory Visit: Payer: Medicaid Other | Admitting: *Deleted

## 2020-05-02 VITALS — BP 123/60 | HR 96

## 2020-05-02 DIAGNOSIS — Z6835 Body mass index (BMI) 35.0-35.9, adult: Secondary | ICD-10-CM

## 2020-05-02 DIAGNOSIS — Z3A19 19 weeks gestation of pregnancy: Secondary | ICD-10-CM | POA: Diagnosis not present

## 2020-05-02 DIAGNOSIS — O99212 Obesity complicating pregnancy, second trimester: Secondary | ICD-10-CM | POA: Diagnosis not present

## 2020-05-02 DIAGNOSIS — O321XX0 Maternal care for breech presentation, not applicable or unspecified: Secondary | ICD-10-CM

## 2020-05-02 DIAGNOSIS — Z362 Encounter for other antenatal screening follow-up: Secondary | ICD-10-CM | POA: Diagnosis not present

## 2020-05-09 ENCOUNTER — Inpatient Hospital Stay (HOSPITAL_COMMUNITY)
Admission: AD | Admit: 2020-05-09 | Discharge: 2020-05-09 | Disposition: A | Discharge status: Home / Self Care | Payer: Medicaid Other | Attending: Obstetrics and Gynecology | Admitting: Obstetrics and Gynecology

## 2020-05-09 ENCOUNTER — Other Ambulatory Visit: Payer: Self-pay

## 2020-05-09 ENCOUNTER — Telehealth: Payer: Self-pay

## 2020-05-09 ENCOUNTER — Encounter (HOSPITAL_COMMUNITY): Payer: Self-pay | Admitting: Obstetrics and Gynecology

## 2020-05-09 DIAGNOSIS — Z3A20 20 weeks gestation of pregnancy: Secondary | ICD-10-CM | POA: Insufficient documentation

## 2020-05-09 DIAGNOSIS — O368120 Decreased fetal movements, second trimester, not applicable or unspecified: Secondary | ICD-10-CM

## 2020-05-09 HISTORY — DX: Other specified health status: Z78.9

## 2020-05-09 LAB — URINALYSIS, ROUTINE W REFLEX MICROSCOPIC
Bilirubin Urine: NEGATIVE
Glucose, UA: NEGATIVE mg/dL
Hgb urine dipstick: NEGATIVE
Ketones, ur: NEGATIVE mg/dL
Nitrite: NEGATIVE
Protein, ur: NEGATIVE mg/dL
Specific Gravity, Urine: 1.014 (ref 1.005–1.030)
Squamous Epithelial / HPF: >50 — ABNORMAL HIGH (ref 0–5)
WBC, UA: >50 WBC/hpf — ABNORMAL HIGH (ref 0–5)
pH: 5 (ref 5.0–8.0)

## 2020-05-09 NOTE — MAU Note (Signed)
Pt reports no fetal movement felt since midnight Wednesday into Thursday. Previously felt baby movement including baby kicking. Tried drinking some cranberry juice.  Denies VB or LOF.

## 2020-05-09 NOTE — Telephone Encounter (Signed)
Patient calls nurse line with concerns for decreased fetal movements. Patient reports that she began feeling baby move after last [place], toward the end of September. Patient reports that she has not felt baby move since Wednesday night, going into Thursday morning.   Denies abdominal pain, cramping, or vaginal bleeding. Spoke with preceptors, Deirdre Priest and Mullin, who advised that patient be evaluated in MAU.   Informed patient of above. Patient will report to MAU for further evaluation.   To PCP  Veronda Prude, RN

## 2020-05-09 NOTE — MAU Provider Note (Signed)
Chief Complaint: Decreased Fetal Movement   First Provider Initiated Contact with Patient 05/09/20 1852      SUBJECTIVE HPI: Angela Johnston is a 19 y.o. G1P0 at [redacted]w[redacted]d who presents to maternity admissions reporting no fetal movement x 2 days. She was feeling movement before but was worried when she didn't feel anything for so long.  She denies any pain, bleeding, or other symptoms.    HPI  Past Medical History:  Diagnosis Date  . Medical history non-contributory    Past Surgical History:  Procedure Laterality Date  . NO PAST SURGERIES    . WISDOM TOOTH EXTRACTION     Social History   Socioeconomic History  . Marital status: Single    Spouse name: Not on file  . Number of children: Not on file  . Years of education: Not on file  . Highest education level: Not on file  Occupational History  . Not on file  Tobacco Use  . Smoking status: Never Smoker  . Smokeless tobacco: Never Used  Vaping Use  . Vaping Use: Never used  Substance and Sexual Activity  . Alcohol use: Not on file  . Drug use: Not on file  . Sexual activity: Not on file  Other Topics Concern  . Not on file  Social History Narrative  . Not on file   Social Determinants of Health   Financial Resource Strain:   . Difficulty of Paying Living Expenses: Not on file  Food Insecurity:   . Worried About Programme researcher, broadcasting/film/video in the Last Year: Not on file  . Ran Out of Food in the Last Year: Not on file  Transportation Needs:   . Lack of Transportation (Medical): Not on file  . Lack of Transportation (Non-Medical): Not on file  Physical Activity:   . Days of Exercise per Week: Not on file  . Minutes of Exercise per Session: Not on file  Stress:   . Feeling of Stress : Not on file  Social Connections:   . Frequency of Communication with Friends and Family: Not on file  . Frequency of Social Gatherings with Friends and Family: Not on file  . Attends Religious Services: Not on file  . Active Member of Clubs or  Organizations: Not on file  . Attends Banker Meetings: Not on file  . Marital Status: Not on file  Intimate Partner Violence:   . Fear of Current or Ex-Partner: Not on file  . Emotionally Abused: Not on file  . Physically Abused: Not on file  . Sexually Abused: Not on file   No current facility-administered medications on file prior to encounter.   Current Outpatient Medications on File Prior to Encounter  Medication Sig Dispense Refill  . Prenatal MV & Min w/FA-DHA (PRENATAL ADULT GUMMY/DHA/FA PO) Take by mouth.    . loratadine (CLARITIN) 10 MG tablet Take 1 tablet (10 mg total) by mouth daily. 30 tablet 3   No Known Allergies  ROS:  Review of Systems   I have reviewed patient's Past Medical Hx, Surgical Hx, Family Hx, Social Hx, medications and allergies.   Physical Exam   Patient Vitals for the past 24 hrs:  BP Temp Temp src Pulse Resp SpO2 Height Weight  05/09/20 1838 119/68 98 F (36.7 C) Oral 86 20 100 % 5\' 3"  (1.6 m) 97.2 kg   Constitutional: Well-developed, well-nourished female in no acute distress.  Cardiovascular: normal rate Respiratory: normal effort GI: Abd soft, non-tender. Pos BS x  4 MS: Extremities nontender, no edema, normal ROM Neurologic: Alert and oriented x 4.  GU: Neg CVAT.   FHT 152 by doppler  LAB RESULTS No results found for this or any previous visit (from the past 24 hour(s)).  B/Positive/-- (08/31 1039)  IMAGING US MFM OB FOLLOW UP  Result Date: 05/02/2020 ----------------------------------------------------------------------  OBSTETRICS REPORT                       (Signed Final 05/02/2020 11:35 am) ---------------------------------------------------------------------- Patient Info  ID #:       [id number]                          D.O.B.:  06-Nov-2000 (19 yrs)  Name:       Angela Johnston                   Visit Date: 05/02/2020 09:21 am ---------------------------------------------------------------------- Performed By   Attending:        Noralee Spaceavi Shankar MD        Ref. Address:     [address]                                                             [address]  Performed By:     Truitt Leepiana Strickland,      [facility]  [facility] ---------------------------------------------------------------------- Orders  #  Description                           Code        Ordered By  1  US MFM OB FOLLOW UP                   [id number]    Noralee SpaceAVI SHANKAR ----------------------------------------------------------------------  #  Order #                     Accession #  Episode #  1  [id number]                   [id number]                 [id number] ---------------------------------------------------------------------- Indications  [redacted] weeks gestation of pregnancy                Z3A.19  Obesity complicating pregnancy, second         O99.212  trimester (Pre-G BMI 35)  Encounter for other antenatal screening        Z36.2  follow-up ---------------------------------------------------------------------- Fetal Evaluation  Num Of Fetuses:         1  Cardiac Activity:       Observed  Presentation:           Breech  Placenta:               Anterior Fundal  P. Cord Insertion:      Visualized, central  Amniotic Fluid  AFI FV:      Within normal limits ---------------------------------------------------------------------- Biometry  BPD:      44.6  mm     G. Age:  19w 3d         34  %    CI:        69.62   %    70 - 86                                                          FL/HC:      18.3   %    16.8 - 19.8  HC:      170.6  mm     G. Age:  19w 5d         33  %    HC/AC:      1.21        1.09 - 1.39  AC:      140.5  mm     G. Age:  19w 3d         30  %    FL/BPD:     70.0   %   FL:       31.2  mm     G. Age:  19w 5d         36  %    FL/AC:      22.2   %    20 - 24  HUM:        29  mm     G. Age:  19w 3d         42  %  CER:      19.2  mm     G. Age:  18w 5d         31  %  NFT:       3.3  mm  LV:        6.4  mm  CM:        6.8  mm  Est. FW:     300  gm    0 lb 11 oz      29  % ---------------------------------------------------------------------- OB History  Blood Type:   B+  Gravidity:    1         Term:  0        Prem:   0        SAB:   0  TOP:          0       Ectopic:  0        Living: 0 ---------------------------------------------------------------------- Gestational Age  U/S Today:     19w 4d                                        EDD:   09/22/20  Best:          19w 6d     Det. By:  U/S  (04/04/20)          EDD:   09/20/20 ---------------------------------------------------------------------- Anatomy  Cranium:               Appears normal         LVOT:                   Appears normal  Cavum:                 Appears normal         Aortic Arch:            Appears normal  Ventricles:            Appears normal         Ductal Arch:            Appears normal  Choroid Plexus:        Appears normal         Diaphragm:              Appears normal  Cerebellum:            Appears normal         Stomach:                Appears normal, left                                                                        sided  Posterior Fossa:       Appears normal         Abdomen:                Appears normal  Nuchal Fold:           Appears normal         Abdominal Wall:         Appears nml (cord                                                                        insert, abd wall)  Face:                  Appears normal  Cord Vessels:           Appears normal (3                         (orbits and profile)                           vessel cord)  Lips:                  Appears normal         Kidneys:                Appear normal  Palate:                Appears normal         Bladder:                 Appears normal  Thoracic:              Appears normal         Spine:                  Appears normal  Heart:                 Appears normal         Upper Extremities:      Appears normal                         (4CH, axis, and                         situs)  RVOT:                  Appears normal         Lower Extremities:      Appears normal  Other:  Fetus appears to be female. Nasal bone visualized. Heels/feet and          open hands/5th digits visualized. VC, 3VV and 3VTV visualized.          Technically difficult due to maternal habitus and fetal position. ---------------------------------------------------------------------- Cervix Uterus Adnexa  Cervix  Length:           3.65  cm.  Normal appearance by transabdominal scan.  Right Ovary  Within normal limits.  Left Ovary  Within normal limits. ---------------------------------------------------------------------- Impression  Patient returned for completion of fetal anatomy .  Amniotic fluid is normal and good fetal activity is seen .Fetal  biometry is consistent with her previously-established dates  .Fetal anatomical survey was completed and appears normal.  Maternal obesity imposes limitations on the resolution of  images, and failure to detect fetal anomalies is more common  in obese pregnant women. ---------------------------------------------------------------------- Recommendations  -Recommend fetal growth assessment at 30 to 32 weeks'  gestation. ----------------------------------------------------------------------                  Noralee Space, MD Electronically Signed Final Report   05/02/2020 11:35 am ----------------------------------------------------------------------   MAU Management/MDM: Orders Placed This Encounter  Procedures  . Urinalysis, Routine w reflex microscopic Urine, Clean Catch  . Discharge patient    No orders of the defined types were placed in this encounter.   FHT normal today. Reassurance provided that movement is not  as consistent at this early gestation.  Pt has anterior placenta which may contribute to  feeling less movement.  Pt to keep appt in 1 week with [facility].  Return to MAU with any OB emergencies.  ASSESSMENT 1. Decreased fetal movements in second trimester, single or unspecified fetus     PLAN Discharge home  Allergies as of 05/09/2020   No Known Allergies     Medication List    TAKE these medications   loratadine 10 MG tablet Commonly known as: CLARITIN Take 1 tablet (10 mg total) by mouth daily.   PRENATAL ADULT GUMMY/DHA/FA PO Take by mouth.       Follow-up Information    [facility] Follow up.   Why: As scheduled, return to MAU as needed for emergencies Contact information: [address] [place] [address] [phone]              Sharen Counter Certified Nurse-Midwife 05/09/2020  7:13 PM

## 2020-05-13 DIAGNOSIS — J069 Acute upper respiratory infection, unspecified: Secondary | ICD-10-CM | POA: Diagnosis not present

## 2020-05-16 ENCOUNTER — Other Ambulatory Visit: Payer: Self-pay

## 2020-05-16 ENCOUNTER — Ambulatory Visit (INDEPENDENT_AMBULATORY_CARE_PROVIDER_SITE_OTHER): Payer: Medicaid Other | Admitting: Student in an Organized Health Care Education/Training Program

## 2020-05-16 VITALS — BP 118/75 | HR 101 | Wt 213.2 lb

## 2020-05-16 DIAGNOSIS — Z3492 Encounter for supervision of normal pregnancy, unspecified, second trimester: Secondary | ICD-10-CM

## 2020-05-16 NOTE — Progress Notes (Signed)
Angela Johnston is a 20 y.o. G1P0 at [redacted]w[redacted]d here for routine follow up. She is dated by early ultrasound.  She reports no complaints.  She reports decreased fetal movement. Was seen at MAU and cleared for discharge. No bleeding, loss of fluid, contractions. See flow sheet for details. Vitals:   05/16/20 0935  BP: 118/75  Pulse: (!) 101   A/P: Pregnancy at [redacted]w[redacted]d.  Doing well.   . Dating reviewed, dating tab is correct . Fetal heart tones Appropriate  135 . Fundal height within expected range.  . Pregnancy issues include concern for decreased fetal movement sensation. Normal HR, growth and movement on exam today. Mother was not able to feel the movement that was demonstrated on [place]. Anterior placenta.  . Anatomy ultrasound reviewed and notable for normal findings. Repeat US recommended at 30-32 weeks for fetal growth assessment. . Problem list updated Yes.  . Influenza vaccine not administered as patient declined, will continue to discuss.  . Pregnancy education provided on the following topics: fetal growth and movement, ultrasound assessment, and upcoming laboratory assessment.   . Recommended schedule for [facility] during second trimester at check out. . Preterm labor precautions given.   Follow up 4 weeks in OB clinic.

## 2020-05-16 NOTE — Patient Instructions (Addendum)
It was a pleasure to see you today!  To summarize our discussion for this visit:  Follow up in 1 month for OB clinic visit.  Please let us know if you have any more concerns about fetal movement but everything looked normal today with the fetal heart rate monitor and measuring.  You will need another ultrasound at about 30-32 weeks.  Some additional health maintenance measures we should update are: Health Maintenance Due  Topic Date Due  . COVID-19 Vaccine (1) Never done  . INFLUENZA VACCINE  03/02/2020  .    Please return to our clinic to see me in 1 month.  Call the clinic at [phone] if your symptoms worsen or you have any concerns.   Thank you for allowing me to take part in your care,  Dr. Jamelle Rushing   Second Trimester of Pregnancy  The second trimester is from week 14 through week 27 (month 4 through 6). This is often the time in pregnancy that you feel your best. Often times, morning sickness has lessened or quit. You may have more energy, and you may get hungry more often. Your unborn baby is growing rapidly. At the end of the sixth month, he or she is about 9 inches long and weighs about 1 pounds. You will likely feel the baby move between 18 and 20 weeks of pregnancy. Follow these instructions at home: Medicines  Take over-the-counter and prescription medicines only as told by your doctor. Some medicines are safe and some medicines are not safe during pregnancy.  Take a prenatal vitamin that contains at least 600 micrograms (mcg) of folic acid.  If you have trouble pooping (constipation), take medicine that will make your stool soft (stool softener) if your doctor approves. Eating and drinking   Eat regular, healthy meals.  Avoid raw meat and uncooked cheese.  If you get low calcium from the food you eat, talk to your doctor about taking a daily calcium supplement.  Avoid foods that are high in fat and sugars, such as fried and sweet foods.  If  you feel sick to your stomach (nauseous) or throw up (vomit): ? Eat 4 or 5 small meals a day instead of 3 large meals. ? Try eating a few soda crackers. ? Drink liquids between meals instead of during meals.  To prevent constipation: ? Eat foods that are high in fiber, like fresh fruits and vegetables, whole grains, and beans. ? Drink enough fluids to keep your pee (urine) clear or pale yellow. Activity  Exercise only as told by your doctor. Stop exercising if you start to have cramps.  Do not exercise if it is too hot, too humid, or if you are in a place of great height (high altitude).  Avoid heavy lifting.  Wear low-heeled shoes. Sit and stand up straight.  You can continue to have sex unless your doctor tells you not to. Relieving pain and discomfort  Wear a good support bra if your breasts are tender.  Take warm water baths (sitz baths) to soothe pain or discomfort caused by hemorrhoids. Use hemorrhoid cream if your doctor approves.  Rest with your legs raised if you have leg cramps or low back pain.  If you develop puffy, bulging veins (varicose veins) in your legs: ? Wear support hose or compression stockings as told by your doctor. ? Raise (elevate) your feet for 15 minutes, 3-4 times a day. ? Limit salt in your food. Prenatal care  Write down your questions. Take  them to your prenatal visits.  Keep all your prenatal visits as told by your doctor. This is important. Safety  Wear your seat belt when driving.  Make a list of emergency phone numbers, including numbers for family, friends, the hospital, and police and fire departments. General instructions  Ask your doctor about the right foods to eat or for help finding a counselor, if you need these services.  Ask your doctor about local prenatal classes. Begin classes before month 6 of your pregnancy.  Do not use hot tubs, steam rooms, or saunas.  Do not douche or use tampons or scented sanitary pads.  Do not  cross your legs for long periods of time.  Visit your dentist if you have not done so. Use a soft toothbrush to brush your teeth. Floss gently.  Avoid all smoking, herbs, and alcohol. Avoid drugs that are not approved by your doctor.  Do not use any products that contain nicotine or tobacco, such as cigarettes and e-cigarettes. If you need help quitting, ask your doctor.  Avoid cat litter boxes and soil used by cats. These carry germs that can cause birth defects in the baby and can cause a loss of your baby (miscarriage) or stillbirth. Contact a doctor if:  You have mild cramps or pressure in your lower belly.  You have pain when you pee (urinate).  You have bad smelling fluid coming from your vagina.  You continue to feel sick to your stomach (nauseous), throw up (vomit), or have watery poop (diarrhea).  You have a nagging pain in your belly area.  You feel dizzy. Get help right away if:  You have a fever.  You are leaking fluid from your vagina.  You have spotting or bleeding from your vagina.  You have severe belly cramping or pain.  You lose or gain weight rapidly.  You have trouble catching your breath and have chest pain.  You notice sudden or extreme puffiness (swelling) of your face, hands, ankles, feet, or legs.  You have not felt the baby move in over an hour.  You have severe headaches that do not go away when you take medicine.  You have trouble seeing. Summary  The second trimester is from week 14 through week 27 (months 4 through 6). This is often the time in pregnancy that you feel your best.  To take care of yourself and your unborn baby, you will need to eat healthy meals, take medicines only if your doctor tells you to do so, and do activities that are safe for you and your baby.  Call your doctor if you get sick or if you notice anything unusual about your pregnancy. Also, call your doctor if you need help with the right food to eat, or if you want  to know what activities are safe for you. This information is not intended to replace advice given to you by your health care provider. Make sure you discuss any questions you have with your health care provider. Document Revised: 11/10/2018 Document Reviewed: 08/24/2016 Elsevier Patient Education  2020 [employer].

## 2020-05-19 ENCOUNTER — Encounter: Payer: Medicaid Other | Admitting: Obstetrics & Gynecology

## 2020-06-16 ENCOUNTER — Other Ambulatory Visit: Payer: Self-pay

## 2020-06-16 ENCOUNTER — Ambulatory Visit (INDEPENDENT_AMBULATORY_CARE_PROVIDER_SITE_OTHER): Payer: Medicaid Other | Admitting: Student in an Organized Health Care Education/Training Program

## 2020-06-16 VITALS — BP 110/62 | HR 85 | Ht 64.0 in | Wt 215.4 lb

## 2020-06-16 DIAGNOSIS — Z3A26 26 weeks gestation of pregnancy: Secondary | ICD-10-CM | POA: Diagnosis not present

## 2020-06-16 LAB — POCT UA - MICROSCOPIC ONLY: Epithelial cells, urine per micros: >20

## 2020-06-16 LAB — POCT URINALYSIS DIP (CLINITEK)
Bilirubin, UA: NEGATIVE
Glucose, UA: NEGATIVE mg/dL
Ketones, POC UA: NEGATIVE mg/dL
Nitrite, UA: NEGATIVE
POC PROTEIN,UA: NEGATIVE
Spec Grav, UA: 1.02 (ref 1.010–1.025)
Urobilinogen, UA: 0.2 E.U./dL
pH, UA: 6.5 (ref 5.0–8.0)

## 2020-06-16 NOTE — Patient Instructions (Signed)
It was a pleasure to see you today!  To summarize our discussion for this visit:  Please google "[facility] prenatal classes" to find parenting classes  We are checking for gestational diabetes and urine infection today.   We measured your baby and listened for heart beat.   You are scheduled for follow up at the [facility] clinic here on Thursday, December 16th.   Some additional health maintenance measures we should update are: Health Maintenance Due  Topic Date Due   COVID-19 Vaccine (1) Never done   INFLUENZA VACCINE  03/02/2020     Call the clinic at [phone] if your symptoms worsen or you have any concerns.   Thank you for allowing me to take part in your care,  Dr. Jamelle Rushing   Round Ligament Pain  The round ligament is a cord of muscle and tissue that helps support the uterus. It can become a source of pain during pregnancy if it becomes stretched or twisted as the baby grows. The pain usually begins in the second trimester (13-28 weeks) of pregnancy, and it can come and go until the baby is delivered. It is not a serious problem, and it does not cause harm to the baby. Round ligament pain is usually a short, sharp, and pinching pain, but it can also be a dull, lingering, and aching pain. The pain is felt in the lower side of the abdomen or in the groin. It usually starts deep in the groin and moves up to the outside of the hip area. The pain may occur when you:  Suddenly change position, such as quickly going from a sitting to standing position.  Roll over in bed.  Cough or sneeze.  Do physical activity. Follow these instructions at home:   Watch your condition for any changes.  When the pain starts, relax. Then try any of these methods to help with the pain: ? Sitting down. ? Flexing your knees up to your abdomen. ? Lying on your side with one pillow under your abdomen and another pillow between your legs. ? Sitting in a warm bath for 15-20 minutes or until the  pain goes away.  Take over-the-counter and prescription medicines only as told by your health care provider.  Move slowly when you sit down or stand up.  Avoid long walks if they cause pain.  Stop or reduce your physical activities if they cause pain.  Keep all follow-up visits as told by your health care provider. This is important. Contact a health care provider if:  Your pain does not go away with treatment.  You feel pain in your back that you did not have before.  Your medicine is not helping. Get help right away if:  You have a fever or chills.  You develop uterine contractions.  You have vaginal bleeding.  You have nausea or vomiting.  You have diarrhea.  You have pain when you urinate. Summary  Round ligament pain is felt in the lower abdomen or groin. It is usually a short, sharp, and pinching pain. It can also be a dull, lingering, and aching pain.  This pain usually begins in the second trimester (13-28 weeks). It occurs because the uterus is stretching with the growing baby, and it is not harmful to the baby.  You may notice the pain when you suddenly change position, when you cough or sneeze, or during physical activity.  Relaxing, flexing your knees to your abdomen, lying on one side, or taking a warm bath  may help to get rid of the pain.  Get help from your health care provider if the pain does not go away or if you have vaginal bleeding, nausea, vomiting, diarrhea, or painful urination. This information is not intended to replace advice given to you by your health care provider. Make sure you discuss any questions you have with your health care provider. Document Revised: 01/04/2018 Document Reviewed: 01/04/2018 Elsevier Patient Education  2020 [employer].

## 2020-06-16 NOTE — Addendum Note (Signed)
Addended by: Veronda Prude on: 06/16/2020 03:45 PM   Modules accepted: Orders

## 2020-06-16 NOTE — Progress Notes (Signed)
Angela Johnston is a 19 y.o. G1P0 at [redacted]w[redacted]d for routine follow up.  She reports some mild round ligament pain. Endorses active fetal movements. Denies contractions, fluid leakage or bleeding. FOB is present at today's visit.  O: General: NAD, pleasant, able to participate in exam Abdomen: gravid Fundal height:28 FHR: 130 Extremities: no edema. WWP. Skin: warm and dry, no rashes noted Neuro: alert and oriented, no focal deficits Psych: Normal affect and mood   A/P: Pregnancy at [redacted]w[redacted]d.  Doing well. Anatomy scan normal. Up 2 lbs since last visit. Today:  -we are checking urine for UTI. It does not appear that she was treated for previous positive urinalysis.  - Performing 1-hour glucose tolerance test.   Pregnancy issues include round ligament pain. Handout given. Childbirth and education classes were offered. Resource provided in AVS. Preterm labor precautions reviewed. Follow up 4 weeks. Dec Thurs 16th with OB clinic.  - will need Tdap vaccine at next visit

## 2020-06-17 ENCOUNTER — Other Ambulatory Visit: Payer: Self-pay | Admitting: Student in an Organized Health Care Education/Training Program

## 2020-06-17 ENCOUNTER — Encounter: Payer: Self-pay | Admitting: Student in an Organized Health Care Education/Training Program

## 2020-06-17 DIAGNOSIS — Z3A26 26 weeks gestation of pregnancy: Secondary | ICD-10-CM

## 2020-06-17 LAB — GLUCOSE TOLERANCE, 1 HOUR: Glucose, 1Hr PP: 159 mg/dL (ref 65–199)

## 2020-06-17 NOTE — Progress Notes (Signed)
OB urine culture ordered and lab appointment made for patient's earliest availability. Confirmed that she is asymptomatic.  Hold off on sending prescription to pharmacy until Cx results.

## 2020-06-19 ENCOUNTER — Encounter: Payer: Self-pay | Admitting: Family Medicine

## 2020-06-19 ENCOUNTER — Ambulatory Visit: Payer: Medicaid Other | Admitting: Podiatry

## 2020-06-19 ENCOUNTER — Other Ambulatory Visit: Payer: Medicaid Other

## 2020-06-19 DIAGNOSIS — O09899 Supervision of other high risk pregnancies, unspecified trimester: Secondary | ICD-10-CM

## 2020-06-19 DIAGNOSIS — Z2839 Supervision of other high risk pregnancies, unspecified trimester: Secondary | ICD-10-CM

## 2020-06-19 HISTORY — DX: Supervision of other high risk pregnancies, unspecified trimester: Z28.39

## 2020-06-19 HISTORY — DX: Supervision of other high risk pregnancies, unspecified trimester: O09.899

## 2020-06-20 ENCOUNTER — Other Ambulatory Visit: Payer: Medicaid Other

## 2020-06-25 ENCOUNTER — Encounter: Payer: Self-pay | Admitting: Student in an Organized Health Care Education/Training Program

## 2020-06-25 ENCOUNTER — Other Ambulatory Visit: Payer: Self-pay | Admitting: Student in an Organized Health Care Education/Training Program

## 2020-06-25 DIAGNOSIS — O99810 Abnormal glucose complicating pregnancy: Secondary | ICD-10-CM

## 2020-06-25 NOTE — Progress Notes (Signed)
Patient was called about positive 1 hour glucola test and requested that she come in for 3hr test.  Her next available day is Thursday Dec 2nd. She was scheduled for 0830 and instructed to not eat anything after midnight. 

## 2020-06-25 NOTE — Progress Notes (Signed)
Patient was called about positive 1 hour glucola test and requested that she come in for 3hr test.  Her next available day is Thursday Dec 2nd. She was scheduled for 0830 and instructed to not eat anything after midnight.

## 2020-07-03 ENCOUNTER — Other Ambulatory Visit (INDEPENDENT_AMBULATORY_CARE_PROVIDER_SITE_OTHER): Payer: Medicaid Other

## 2020-07-03 ENCOUNTER — Other Ambulatory Visit: Payer: Self-pay

## 2020-07-03 DIAGNOSIS — R110 Nausea: Secondary | ICD-10-CM

## 2020-07-03 DIAGNOSIS — O99810 Abnormal glucose complicating pregnancy: Secondary | ICD-10-CM

## 2020-07-03 DIAGNOSIS — Z3A26 26 weeks gestation of pregnancy: Secondary | ICD-10-CM

## 2020-07-03 DIAGNOSIS — Z3483 Encounter for supervision of other normal pregnancy, third trimester: Secondary | ICD-10-CM

## 2020-07-03 LAB — POCT CBG (FASTING - GLUCOSE)-MANUAL ENTRY: Glucose Fasting, POC: 97 mg/dL (ref 70–99)

## 2020-07-03 MED ORDER — ONDANSETRON 4 MG PO TBDP
4.0000 mg | ORAL_TABLET | Freq: Once | ORAL | Status: AC
  Administered 2020-07-03: 4 mg via ORAL

## 2020-07-04 LAB — GESTATIONAL GLUCOSE TOLERANCE
Glucose, Fasting: 89 mg/dL (ref 65–94)
Glucose, GTT - 1 Hour: 170 mg/dL (ref 65–179)
Glucose, GTT - 2 Hour: 161 mg/dL — ABNORMAL HIGH (ref 65–154)
Glucose, GTT - 3 Hour: 127 mg/dL (ref 65–139)

## 2020-07-05 LAB — URINE CULTURE, OB REFLEX

## 2020-07-05 LAB — CULTURE, OB URINE

## 2020-07-10 ENCOUNTER — Encounter (HOSPITAL_COMMUNITY): Payer: Self-pay | Admitting: Obstetrics and Gynecology

## 2020-07-10 ENCOUNTER — Other Ambulatory Visit: Payer: Self-pay

## 2020-07-10 ENCOUNTER — Inpatient Hospital Stay (HOSPITAL_COMMUNITY)
Admission: AD | Admit: 2020-07-10 | Discharge: 2020-07-10 | Disposition: A | Discharge status: Home / Self Care | Payer: Medicaid Other | Attending: Obstetrics and Gynecology | Admitting: Obstetrics and Gynecology

## 2020-07-10 ENCOUNTER — Ambulatory Visit (INDEPENDENT_AMBULATORY_CARE_PROVIDER_SITE_OTHER): Payer: Medicaid Other | Admitting: Family Medicine

## 2020-07-10 ENCOUNTER — Other Ambulatory Visit (HOSPITAL_COMMUNITY)
Admission: RE | Admit: 2020-07-10 | Discharge: 2020-07-10 | Disposition: A | Discharge status: Home / Self Care | Payer: Medicaid Other | Source: Ambulatory Visit | Attending: Family Medicine | Admitting: Family Medicine

## 2020-07-10 VITALS — BP 102/72 | HR 97 | Wt 217.8 lb

## 2020-07-10 DIAGNOSIS — Z3689 Encounter for other specified antenatal screening: Secondary | ICD-10-CM

## 2020-07-10 DIAGNOSIS — O99213 Obesity complicating pregnancy, third trimester: Secondary | ICD-10-CM

## 2020-07-10 DIAGNOSIS — Z3403 Encounter for supervision of normal first pregnancy, third trimester: Secondary | ICD-10-CM | POA: Insufficient documentation

## 2020-07-10 DIAGNOSIS — O368130 Decreased fetal movements, third trimester, not applicable or unspecified: Secondary | ICD-10-CM | POA: Diagnosis not present

## 2020-07-10 DIAGNOSIS — Z3A29 29 weeks gestation of pregnancy: Secondary | ICD-10-CM | POA: Insufficient documentation

## 2020-07-10 DIAGNOSIS — O368120 Decreased fetal movements, second trimester, not applicable or unspecified: Secondary | ICD-10-CM

## 2020-07-10 NOTE — MAU Provider Note (Signed)
History     CSN: [id number]  Arrival date and time: 07/10/20 1103   Event Date/Time   First Provider Initiated Contact with Patient 07/10/20 1136      Chief Complaint  Patient presents with  . Decreased Fetal Movement   Angela Johnston is a 19 y.o. G1P0 at [redacted]w[redacted]d who receives care at [facility].  She presents today for Decreased Fetal Movement.  She states that she has not felt baby move "like that" for the last 2 days.  Patient goes on to clarify that she was told that she should be feeling movement "a lot."  Patient reports she was seen in the office today and instructed to report for evaluation.  Patient denies vaginal bleeding, leaking, or discharge.  No abdominal cramping or contractions.     OB History    Gravida  1   Para      Term      Preterm      AB      Living        SAB      IAB      Ectopic      Multiple      Live Births              Past Medical History:  Diagnosis Date  . Medical history non-contributory     Past Surgical History:  Procedure Laterality Date  . NO PAST SURGERIES    . WISDOM TOOTH EXTRACTION      Family History  Problem Relation Age of Onset  . Healthy Mother   . Healthy Father     Social History   Tobacco Use  . Smoking status: Never Smoker  . Smokeless tobacco: Never Used  Vaping Use  . Vaping Use: Never used  Substance Use Topics  . Alcohol use: Not Currently  . Drug use: Not Currently    Allergies: No Known Allergies  Medications Prior to Admission  Medication Sig Dispense Refill Last Dose  . loratadine (CLARITIN) 10 MG tablet Take 1 tablet (10 mg total) by mouth daily. 30 tablet 3   . Prenatal MV & Min w/FA-DHA (PRENATAL ADULT GUMMY/DHA/FA PO) Take by mouth.       Review of Systems  Constitutional: Negative for chills and fever.  Eyes: Negative for visual disturbance.  Respiratory: Negative for cough and shortness of breath.   Gastrointestinal: Negative for abdominal pain, nausea and vomiting.   Genitourinary: Negative for difficulty urinating, dysuria, vaginal bleeding and vaginal discharge.  Musculoskeletal: Positive for back pain (Lower-Intermittent).  Neurological: Negative for dizziness, light-headedness and headaches.   Physical Exam   Blood pressure 109/73, pulse (!) 107, temperature 98.2 F (36.8 C), temperature source Oral, resp. rate 15, height 5\' 4"  (1.626 m), weight 99.4 kg, SpO2 100 %.  Physical Exam Constitutional:      Appearance: Normal appearance.  HENT:     Head: Normocephalic and atraumatic.  Eyes:     Conjunctiva/sclera: Conjunctivae normal.  Cardiovascular:     Rate and Rhythm: Normal rate and regular rhythm.  Pulmonary:     Effort: Pulmonary effort is normal.     Breath sounds: Normal breath sounds.  Abdominal:     General: Bowel sounds are normal.  Musculoskeletal:     Cervical back: Normal range of motion.  Skin:    General: Skin is warm and dry.  Neurological:     Mental Status: She is alert and oriented to person, place, and time.  Psychiatric:  Mood and Affect: Mood normal.        Behavior: Behavior normal.        Thought Content: Thought content normal.    135 bpm, Mod Var, -Decels, +Accels No ctx graphed MAU Course  Procedures  MDM Education Monitoring Assessment and Plan  19 year old  G1P0 at 29.5 weeks Decreased Fetal Movement  -Discussed expectations regarding fetal movement. -Reassured that fetal movement can be vary based on many factors including placenta location, fetal sleep cycles, time of day. -Patient reports placenta is anterior.  Informed that this could contribute to perception of decreased fetal movement. -Patient verbalizes understanding and has no q/c. -Will give snack as patient only ate breakfast. -NST reactive, but will monitor and reassess.  Cherre Robins 07/10/2020, 11:36 AM   Reassessment (12:20 PM)  -NST remains reactive -Reassured that anterior placenta is normal variation.   -Encouraged to call or return to MAU if symptoms worsen or with the onset of new symptoms. -Discharged to home in stable condition.  Cherre Robins MSN, CNM Advanced Practice Provider, [facility] [employer]

## 2020-07-10 NOTE — Patient Instructions (Addendum)
It was wonderful to see you today.  Please bring ALL of your medications with you to every visit.   Today we talked about:  - Go to the MAU - Entrace C by the emergency department for assessment of fetal wellbeing due to decreased fetal movement - We checked lab work and swabs today, will send message with results - We have scheduled a follow-up ultrasound for you to reassess fetal growth - We discussed Tdap today- will do at next visit!!!!!! - We discussed COVID and flu vaccines today and why they are SO IMPORTANT for you- if you change your mind please let us know so we can get you vaccinated and protect you and your baby! - F/u in 2 weeks with Dr Dareen Piano   Thank you for choosing [facility].   Please call [phone] with any questions about today's appointment.  Please be sure to schedule follow up at the front  desk before you leave today.   Burley Saver, MD  Family Medicine

## 2020-07-10 NOTE — MAU Note (Signed)
. .  Angela Johnston is a 19 y.o. at [redacted]w[redacted]d here in MAU reporting: Has not felt the baby move since Tuesday. No VB or LOF. States she has an anterior placenta.   Pain score: 0 Vitals:   07/10/20 1116  BP: 109/73  Pulse: (!) 107  Resp: 15  Temp: 98.2 F (36.8 C)  SpO2: 100%     FHT:146 Lab orders placed from triage: UA

## 2020-07-10 NOTE — Progress Notes (Signed)
  [facility] Prenatal Visit  Angela Johnston is a 19 y.o. G1P0 at [redacted]w[redacted]d here for routine follow up. She is dated by 2nd trimester ultrasound.  She reports right-sided pelvic pain worse with movement, not constant. She reports decreased fetal movement that started 2 days ago, just not feeling baby move as much, moved some today but not much. She denies vaginal bleeding, contractions, or loss of fluid. See flow sheet for details.  Vitals:   07/10/20 0938  BP: 102/72  Pulse: 97   Fundal height 29 cm, FHT 130s-140s, ausculatated without decelerations   A/P: Pregnancy at [redacted]w[redacted]d.  Doing well.   1. Routine prenatal care:  Marland Kitchen Dating reviewed, dating tab is correct . Fetal heart tones Appropriate . Fundal height within expected range.  . Infant feeding choice: Undecided , discussed benefits of breastfeeding today . Contraception choice: Undecided  . Infant circumcision desired: not applicable  . The patient does not have a history of Cesarean delivery and no referral to [facility] is indicated . Influenza vaccine not administered as patient declined, will continue to discuss.   . Tdap was not given today. Patient wants to wait until next visit to get it. Discussed importance of Tdap today. . CBC, RPR, and HIV were obtained today.  GC/Chlamydia also obtained today due to no previous record of GC/Chlamydia testing yet this pregnancy.  . Rh status was reviewed and patient does not need Rhogam.  Rhogam was not given today.  . Pregnancy medical home and PHQ-9 forms were done today and reviewed.   . Pregnancy education regarding benefits of breastfeeding, contraception, fetal growth, expected weight gain, and safe infant sleep were discussed.  . Preterm labor and fetal movement precautions reviewed.  2. Pregnancy issues include the following and were addressed as appropriate today:  Decreased fetal movement - normal fetal heart tone by doppler, sent to MAU for NST,  discussed fetal movement/kick counts Obesity in pregnancy- failed 1 hr GTT, passed 3 hr GTT. Weight gain has been appropriate. Repeat US scheduled per anatomy US recommendation for better views per MFM recommendation between 30-32 weeks. Round ligament pain- discussed pregnancy belt for support. Maternal varicella- non-immune, will need vaccination postpartum.  Hemoglobin electrophoresis ordered as well as had not yet been done this pregnancy.  Follow up 2 weeks, needs Tdap at next visit.  I saw and evaluated the patient with Dr Wynelle Link in [facility] clinic. I agree with the assessment and plan as documented below.  Burley Saver, MD

## 2020-07-10 NOTE — Discharge Instructions (Signed)
Third Trimester of Pregnancy The third trimester is from week 28 through week 40 (months 7 through 9). The third trimester is a time when the unborn baby (fetus) is growing rapidly. At the end of the ninth month, the fetus is about 20 inches in length and weighs 6-10 pounds. Body changes during your third trimester Your body will continue to go through many changes during pregnancy. The changes vary from woman to woman. During the third trimester:  Your weight will continue to increase. You can expect to gain 25-35 pounds (11-16 kg) by the end of the pregnancy.  You may begin to get stretch marks on your hips, abdomen, and breasts.  You may urinate more often because the fetus is moving lower into your pelvis and pressing on your bladder.  You may develop or continue to have heartburn. This is caused by increased hormones that slow down muscles in the digestive tract.  You may develop or continue to have constipation because increased hormones slow digestion and cause the muscles that push waste through your intestines to relax.  You may develop hemorrhoids. These are swollen veins (varicose veins) in the rectum that can itch or be painful.  You may develop swollen, bulging veins (varicose veins) in your legs.  You may have increased body aches in the pelvis, back, or thighs. This is due to weight gain and increased hormones that are relaxing your joints.  You may have changes in your hair. These can include thickening of your hair, rapid growth, and changes in texture. Some women also have hair loss during or after pregnancy, or hair that feels dry or thin. Your hair will most likely return to normal after your baby is born.  Your breasts will continue to grow and they will continue to become tender. A yellow fluid (colostrum) may leak from your breasts. This is the first milk you are producing for your baby.  Your belly button may stick out.  You may notice more swelling in your hands,  face, or ankles.  You may have increased tingling or numbness in your hands, arms, and legs. The skin on your belly may also feel numb.  You may feel short of breath because of your expanding uterus.  You may have more problems sleeping. This can be caused by the size of your belly, increased need to urinate, and an increase in your body's metabolism.  You may notice the fetus "dropping," or moving lower in your abdomen (lightening).  You may have increased vaginal discharge.  You may notice your joints feel loose and you may have pain around your pelvic bone. What to expect at prenatal visits You will have prenatal exams every 2 weeks until week 36. Then you will have weekly prenatal exams. During a routine prenatal visit:  You will be weighed to make sure you and the baby are growing normally.  Your blood pressure will be taken.  Your abdomen will be measured to track your baby's growth.  The fetal heartbeat will be listened to.  Any test results from the previous visit will be discussed.  You may have a cervical check near your due date to see if your cervix has softened or thinned (effaced).  You will be tested for Group B streptococcus. This happens between 35 and 37 weeks. Your health care provider may ask you:  What your birth plan is.  How you are feeling.  If you are feeling the baby move.  If you have had any abnormal   symptoms, such as leaking fluid, bleeding, severe headaches, or abdominal cramping.  If you are using any tobacco products, including cigarettes, chewing tobacco, and electronic cigarettes.  If you have any questions. Other tests or screenings that may be performed during your third trimester include:  Blood tests that check for low iron levels (anemia).  Fetal testing to check the health, activity level, and growth of the fetus. Testing is done if you have certain medical conditions or if there are problems during the pregnancy.  Nonstress test  (NST). This test checks the health of your baby to make sure there are no signs of problems, such as the baby not getting enough oxygen. During this test, a belt is placed around your belly. The baby is made to move, and its heart rate is monitored during movement. What is false labor? False labor is a condition in which you feel small, irregular tightenings of the muscles in the womb (contractions) that usually go away with rest, changing position, or drinking water. These are called Braxton Hicks contractions. Contractions may last for hours, days, or even weeks before true labor sets in. If contractions come at regular intervals, become more frequent, increase in intensity, or become painful, you should see your health care provider. What are the signs of labor?  Abdominal cramps.  Regular contractions that start at 10 minutes apart and become stronger and more frequent with time.  Contractions that start on the top of the uterus and spread down to the lower abdomen and back.  Increased pelvic pressure and dull back pain.  A watery or bloody mucus discharge that comes from the vagina.  Leaking of amniotic fluid. This is also known as your "water breaking." It could be a slow trickle or a gush. Let your health care provider know if it has a color or strange odor. If you have any of these signs, call your health care provider right away, even if it is before your due date. Follow these instructions at home: Medicines  Follow your health care provider's instructions regarding medicine use. Specific medicines may be either safe or unsafe to take during pregnancy.  Take a prenatal vitamin that contains at least 600 micrograms (mcg) of folic acid.  If you develop constipation, try taking a stool softener if your health care provider approves. Eating and drinking   Eat a balanced diet that includes fresh fruits and vegetables, whole grains, good sources of protein such as meat, eggs, or tofu,  and low-fat dairy. Your health care provider will help you determine the amount of weight gain that is right for you.  Avoid raw meat and uncooked cheese. These carry germs that can cause birth defects in the baby.  If you have low calcium intake from food, talk to your health care provider about whether you should take a daily calcium supplement.  Eat four or five small meals rather than three large meals a day.  Limit foods that are high in fat and processed sugars, such as fried and sweet foods.  To prevent constipation: ? Drink enough fluid to keep your urine clear or pale yellow. ? Eat foods that are high in fiber, such as fresh fruits and vegetables, whole grains, and beans. Activity  Exercise only as directed by your health care provider. Most women can continue their usual exercise routine during pregnancy. Try to exercise for 30 minutes at least 5 days a week. Stop exercising if you experience uterine contractions.  Avoid heavy lifting.  Do   not exercise in extreme heat or humidity, or at high altitudes.  Wear low-heel, comfortable shoes.  Practice good posture.  You may continue to have sex unless your health care provider tells you otherwise. Relieving pain and discomfort  Take frequent breaks and rest with your legs elevated if you have leg cramps or low back pain.  Take warm sitz baths to soothe any pain or discomfort caused by hemorrhoids. Use hemorrhoid cream if your health care provider approves.  Wear a good support bra to prevent discomfort from breast tenderness.  If you develop varicose veins: ? Wear support pantyhose or compression stockings as told by your healthcare provider. ? Elevate your feet for 15 minutes, 3-4 times a day. Prenatal care  Write down your questions. Take them to your prenatal visits.  Keep all your prenatal visits as told by your health care provider. This is important. Safety  Wear your seat belt at all times when driving.  Make  a list of emergency phone numbers, including numbers for family, friends, the hospital, and police and fire departments. General instructions  Avoid cat litter boxes and soil used by cats. These carry germs that can cause birth defects in the baby. If you have a cat, ask someone to clean the litter box for you.  Do not travel far distances unless it is absolutely necessary and only with the approval of your health care provider.  Do not use hot tubs, steam rooms, or saunas.  Do not drink alcohol.  Do not use any products that contain nicotine or tobacco, such as cigarettes and e-cigarettes. If you need help quitting, ask your health care provider.  Do not use any medicinal herbs or unprescribed drugs. These chemicals affect the formation and growth of the baby.  Do not douche or use tampons or scented sanitary pads.  Do not cross your legs for long periods of time.  To prepare for the arrival of your baby: ? Take prenatal classes to understand, practice, and ask questions about labor and delivery. ? Make a trial run to the hospital. ? Visit the hospital and tour the maternity area. ? Arrange for maternity or paternity leave through employers. ? Arrange for family and friends to take care of pets while you are in the hospital. ? Purchase a rear-facing car seat and make sure you know how to install it in your car. ? Pack your hospital bag. ? Prepare the baby's nursery. Make sure to remove all pillows and stuffed animals from the baby's crib to prevent suffocation.  Visit your dentist if you have not gone during your pregnancy. Use a soft toothbrush to brush your teeth and be gentle when you floss. Contact a health care provider if:  You are unsure if you are in labor or if your water has broken.  You become dizzy.  You have mild pelvic cramps, pelvic pressure, or nagging pain in your abdominal area.  You have lower back pain.  You have persistent nausea, vomiting, or  diarrhea.  You have an unusual or bad smelling vaginal discharge.  You have pain when you urinate. Get help right away if:  Your water breaks before 37 weeks.  You have regular contractions less than 5 minutes apart before 37 weeks.  You have a fever.  You are leaking fluid from your vagina.  You have spotting or bleeding from your vagina.  You have severe abdominal pain or cramping.  You have rapid weight loss or weight gain.  You have   shortness of breath with chest pain.  You notice sudden or extreme swelling of your face, hands, ankles, feet, or legs.  Your baby makes fewer than 10 movements in 2 hours.  You have severe headaches that do not go away when you take medicine.  You have vision changes. Summary  The third trimester is from week 28 through week 40, months 7 through 9. The third trimester is a time when the unborn baby (fetus) is growing rapidly.  During the third trimester, your discomfort may increase as you and your baby continue to gain weight. You may have abdominal, leg, and back pain, sleeping problems, and an increased need to urinate.  During the third trimester your breasts will keep growing and they will continue to become tender. A yellow fluid (colostrum) may leak from your breasts. This is the first milk you are producing for your baby.  False labor is a condition in which you feel small, irregular tightenings of the muscles in the womb (contractions) that eventually go away. These are called Braxton Hicks contractions. Contractions may last for hours, days, or even weeks before true labor sets in.  Signs of labor can include: abdominal cramps; regular contractions that start at 10 minutes apart and become stronger and more frequent with time; watery or bloody mucus discharge that comes from the vagina; increased pelvic pressure and dull back pain; and leaking of amniotic fluid. This information is not intended to replace advice given to you by your  health care provider. Make sure you discuss any questions you have with your health care provider. Document Revised: 11/09/2018 Document Reviewed: 08/24/2016 Elsevier Patient Education  2020 [employer]  

## 2020-07-11 LAB — CERVICOVAGINAL ANCILLARY ONLY
Chlamydia: NEGATIVE
Comment: NEGATIVE
Comment: NEGATIVE
Comment: NORMAL
Neisseria Gonorrhea: NEGATIVE
Trichomonas: NEGATIVE

## 2020-07-15 LAB — RPR: RPR Ser Ql: NONREACTIVE

## 2020-07-15 LAB — CBC
Hematocrit: 32.3 % — ABNORMAL LOW (ref 34.0–46.6)
Hemoglobin: 10.5 g/dL — ABNORMAL LOW (ref 11.1–15.9)
MCH: 26.3 pg — ABNORMAL LOW (ref 26.6–33.0)
MCHC: 32.5 g/dL (ref 31.5–35.7)
MCV: 81 fL (ref 79–97)
Platelets: 232 10*3/uL (ref 150–450)
RBC: 4 x10E6/uL (ref 3.77–5.28)
RDW: 15.1 % (ref 11.7–15.4)
WBC: 8.7 10*3/uL (ref 3.4–10.8)

## 2020-07-15 LAB — HGB FRAC BY HPLC+SOLUBILITY
Hgb A: 61.4 % — ABNORMAL LOW (ref 96.4–98.8)
Hgb C: 0 %
Hgb E: 0 %
Hgb F: 0 % (ref 0.0–2.0)
Hgb S: 36.3 % — ABNORMAL HIGH
Hgb Solubility: POSITIVE — AB
Hgb Variant: 1 % — ABNORMAL HIGH

## 2020-07-15 LAB — HGB FRACTIONATION CASCADE: Hgb A2: 1.3 % — ABNORMAL LOW (ref 1.8–3.2)

## 2020-07-15 LAB — HIV ANTIBODY (ROUTINE TESTING W REFLEX): HIV Screen 4th Generation wRfx: NONREACTIVE

## 2020-07-16 ENCOUNTER — Other Ambulatory Visit: Payer: Self-pay | Admitting: Family Medicine

## 2020-07-16 ENCOUNTER — Encounter: Payer: Self-pay | Admitting: Family Medicine

## 2020-07-16 DIAGNOSIS — O99013 Anemia complicating pregnancy, third trimester: Secondary | ICD-10-CM | POA: Insufficient documentation

## 2020-07-16 DIAGNOSIS — D573 Sickle-cell trait: Secondary | ICD-10-CM

## 2020-07-16 HISTORY — DX: Sickle-cell trait: D57.3

## 2020-07-16 MED ORDER — FERROUS SULFATE 325 (65 FE) MG PO TBEC: 325.0000 mg | DELAYED_RELEASE_TABLET | ORAL | 1 refills | Status: DC

## 2020-07-23 ENCOUNTER — Other Ambulatory Visit: Payer: Self-pay

## 2020-07-23 ENCOUNTER — Ambulatory Visit (INDEPENDENT_AMBULATORY_CARE_PROVIDER_SITE_OTHER): Payer: Medicaid Other | Admitting: Student in an Organized Health Care Education/Training Program

## 2020-07-23 VITALS — BP 122/60 | HR 103 | Wt 221.2 lb

## 2020-07-23 DIAGNOSIS — Z23 Encounter for immunization: Secondary | ICD-10-CM

## 2020-07-23 DIAGNOSIS — Z3493 Encounter for supervision of normal pregnancy, unspecified, third trimester: Secondary | ICD-10-CM

## 2020-07-23 MED ORDER — METAMUCIL 28 % PO PACK: 1.0000 | PACK | Freq: Two times a day (BID) | ORAL | 1 refills | Status: DC

## 2020-07-23 NOTE — Progress Notes (Signed)
  [facility] Prenatal Visit  Angela Johnston is a 19 y.o. G1P0 at [redacted]w[redacted]d here for routine follow up. Angela Johnston is dated by early ultrasound.  Angela Johnston reports no complaints.  Angela Johnston reports mild fetal movement, stable. Angela Johnston denies vaginal bleeding, contractions, or loss of fluid.  See flow sheet for details. Craves ice.  Started taking iron 12/20 so took second dose today. Last BM was Monday. Did not strain. Does not feel uncomfortable/full. Has not tried anything to constipation yet.  Follow up US scheduled next week. Patient is aware.  Vitals:   07/23/20 1006  BP: 122/60  Pulse: (!) 103     A/P: Pregnancy at [redacted]w[redacted]d.  Doing well.   1. Routine prenatal care:  Marland Kitchen Dating reviewed, dating tab is correct . Fetal heart tones: Appropriate . Fundal height: within expected range.  . The patient does not have a history of HSV and valacyclovir is not indicated at this time.  . The patient does not have a history of Cesarean delivery and no referral to [facility] is indicated . Infant feeding choice: Undecided  . Influenza vaccine not administered as patient declined, will continue to discuss.   . Tdap was given today. . Childbirth and education classes were not offered. . Pregnancy education regarding benefits of breastfeeding, contraception, fetal growth, expected weight gain, and safe infant sleep were discussed.  . Preterm labor and fetal movement precautions reviewed.   2. Pregnancy issues include the following and were addressed as appropriate today: . Follow up US schedule  Constipation treatments, BID fiber supplement  Continue iron every other day . Problem list and pregnancy box updated: Yes.   Follow up 2 weeks.

## 2020-07-23 NOTE — Progress Notes (Deleted)
Monday last BM. Started iron that day. Not uncomfortable.  Fiber powder twice per day [place]

## 2020-07-23 NOTE — Patient Instructions (Signed)
It was a pleasure to see you today!  To summarize our discussion for this visit:  Your growth and baby's heart rate are looking great.   For constipation- I recommend a fiber supplement twice per day  Follow up with your ultrasound on dec 28th at 1:15  Follow up with [place] in 2 weeks  Thanks for getting your Tdap today!  Some additional health maintenance measures we should update are: Health Maintenance Due  Topic Date Due   COVID-19 Vaccine (1) Never done   INFLUENZA VACCINE  03/02/2020      Please return to our clinic to see me 2 weeks.  Call the clinic at [phone] if your symptoms worsen or you have any concerns.   Thank you for allowing me to take part in your care,  Dr. Jamelle Rushing

## 2020-07-29 ENCOUNTER — Ambulatory Visit: Payer: Medicaid Other | Admitting: *Deleted

## 2020-07-29 ENCOUNTER — Ambulatory Visit: Payer: Medicaid Other | Attending: Family Medicine

## 2020-07-29 ENCOUNTER — Other Ambulatory Visit: Payer: Self-pay

## 2020-07-29 ENCOUNTER — Encounter: Payer: Self-pay | Admitting: *Deleted

## 2020-07-29 VITALS — BP 124/71 | HR 88

## 2020-07-29 DIAGNOSIS — O99213 Obesity complicating pregnancy, third trimester: Secondary | ICD-10-CM | POA: Insufficient documentation

## 2020-07-29 DIAGNOSIS — E669 Obesity, unspecified: Secondary | ICD-10-CM | POA: Diagnosis not present

## 2020-07-29 DIAGNOSIS — Z6834 Body mass index (BMI) 34.0-34.9, adult: Secondary | ICD-10-CM | POA: Insufficient documentation

## 2020-07-29 DIAGNOSIS — Z3A32 32 weeks gestation of pregnancy: Secondary | ICD-10-CM | POA: Diagnosis not present

## 2020-08-05 ENCOUNTER — Ambulatory Visit (INDEPENDENT_AMBULATORY_CARE_PROVIDER_SITE_OTHER): Payer: Medicaid Other | Admitting: Student in an Organized Health Care Education/Training Program

## 2020-08-05 ENCOUNTER — Other Ambulatory Visit: Payer: Self-pay

## 2020-08-05 VITALS — BP 106/68 | HR 100

## 2020-08-05 DIAGNOSIS — Z3403 Encounter for supervision of normal first pregnancy, third trimester: Secondary | ICD-10-CM

## 2020-08-05 NOTE — Patient Instructions (Signed)
It was a pleasure to see you today!  To summarize our discussion for this visit:  Your pregnancy is measuring along where it should be. This was a simple check in today but next visit in 2 weeks will have the GBS swab.   Try [employer] or another Geophysicist/field seismologist for a belly binder.  Get plenty of rest and good nutrition for the home stretch.  Some additional health maintenance measures we should update are: Health Maintenance Due  Topic Date Due  . COVID-19 Vaccine (1) Never done  . INFLUENZA VACCINE  03/02/2020  .     Call the clinic at [phone] if your symptoms worsen or you have any concerns.   Thank you for allowing me to take part in your care,  Dr. Jamelle Rushing   Group B Streptococcus Test During Pregnancy Why am I having this test? Routine testing, also called screening, for group B streptococcus (GBS) is recommended for all pregnant women between the 36th and 37th week of pregnancy. GBS is a type of bacteria that can be passed from mother to baby during childbirth. Screening will help guide whether or not you will need treatment during labor and delivery to prevent complications such as:  An infection in your uterus during labor.  An infection in your uterus after delivery.  A serious infection in your baby after delivery, such as pneumonia, meningitis, or sepsis. GBS screening is not often done before 36 weeks of pregnancy unless you go into labor prematurely. What happens if I have group B streptococcus? If testing shows that you have GBS, your health care provider will recommend treatment with IV antibiotics during labor and delivery. This treatment significantly decreases the risk of complications for you and your baby. If you have a planned C-section and you have GBS, you may not need to be treated with antibiotics because GBS is usually passed to babies after labor starts and your water breaks. If you are in labor or your water breaks before your C-section, it  is possible for GBS to get into your uterus and be passed to your baby, so you might need treatment. Is there a chance I may not need to be tested? You may not need to be tested for GBS if:  You have a urine test that shows GBS before 36 to 37 weeks.  You had a baby with GBS infection after a previous delivery. In these cases, you will automatically be treated for GBS during labor and delivery. What is being tested? This test is done to check if you have group B streptococcus in your vagina or rectum. What kind of sample is taken? To collect samples for this test, your health care provider will swab your vagina and rectum with a cotton swab. The sample is then sent to the lab to see if GBS is present. What happens during the test?   You will remove your clothing from the waist down.  You will lie down on an exam table in the same position as you would for a pelvic exam.  Your health care provider will swab your vagina and rectum to collect samples for a culture test.  You will be able to go home after the test and do all your usual activities. How are the results reported? The test results are reported as positive or negative. What do the results mean?  A positive test means you are at risk for passing GBS to your baby during labor and delivery. Your health care  provider will recommend that you are treated with an IV antibiotic during labor and delivery.  A negative test means you are at very low risk of passing GBS to your baby. There is still a low risk of passing GBS to your baby because sometimes test results may report that you do not have a condition when you do (false-negative result) or there is a chance that you may become infected with GBS after the test is done. You most likely will not need to be treated with an antibiotic during labor and delivery. Talk with your health care provider about what your results mean. Questions to ask your health care provider Ask your health  care provider, or the department that is doing the test:  When will my results be ready?  How will I get my results?  What are my treatment options? Summary  Routine testing (screening) for group B streptococcus (GBS) is recommended for all pregnant women between the 36th and 37th week of pregnancy.  GBS is a type of bacteria that can be passed from mother to baby during childbirth.  If testing shows that you have GBS, your health care provider will recommend that you are treated with IV antibiotics during labor and delivery. This treatment almost always prevents infection in newborns. This information is not intended to replace advice given to you by your health care provider. Make sure you discuss any questions you have with your health care provider. Document Revised: 11/09/2018 Document Reviewed: 08/16/2018 Elsevier Patient Education  2020 [employer].

## 2020-08-05 NOTE — Progress Notes (Signed)
  [facility] Prenatal Visit  Angela Johnston is a 20 y.o. G1P0 at [redacted]w[redacted]d here for routine follow up. She is dated by early ultrasound.  She reports backache.  She reports fetal movement. She denies vaginal bleeding, contractions, or loss of fluid.  See flow sheet for details.  Vitals:   08/05/20 1400  BP: 106/68  Pulse: 100  SpO2: 98%     A/P: Pregnancy at [redacted]w[redacted]d.  Doing well.   1. Routine prenatal care:  Marland Kitchen Dating reviewed, dating tab is correct . Fetal heart tones: Appropriate . Fundal height: within expected range.  . Pregnancy education regarding benefits of breastfeeding, contraception, fetal growth, expected weight gain, and safe infant sleep were discussed.  . Preterm labor and fetal movement precautions reviewed.  2. Pregnancy issues include the following and were addressed as appropriate today: . Preparation for next visit tasks. . Problem list and pregnancy box updated: Yes.  **was unable to update pregnancy tab this visit due to error with epic appointment scheduling.**  Follow up 2 weeks.

## 2020-08-05 NOTE — Progress Notes (Signed)
Weight 230 pounds Vitals:   08/05/20 1400  BP: 106/68  Pulse: 100  SpO2: 98%

## 2020-08-19 ENCOUNTER — Encounter: Payer: Medicaid Other | Admitting: Student in an Organized Health Care Education/Training Program

## 2020-08-20 DIAGNOSIS — R059 Cough, unspecified: Secondary | ICD-10-CM | POA: Diagnosis not present

## 2020-08-20 DIAGNOSIS — B349 Viral infection, unspecified: Secondary | ICD-10-CM | POA: Diagnosis not present

## 2020-08-25 ENCOUNTER — Ambulatory Visit (HOSPITAL_COMMUNITY): Payer: Self-pay

## 2020-08-25 ENCOUNTER — Other Ambulatory Visit (HOSPITAL_COMMUNITY)
Admission: RE | Admit: 2020-08-25 | Discharge: 2020-08-25 | Disposition: A | Discharge status: Home / Self Care | Payer: Medicaid Other | Source: Ambulatory Visit | Attending: Family Medicine | Admitting: Family Medicine

## 2020-08-25 ENCOUNTER — Other Ambulatory Visit: Payer: Self-pay

## 2020-08-25 ENCOUNTER — Ambulatory Visit (INDEPENDENT_AMBULATORY_CARE_PROVIDER_SITE_OTHER): Payer: Medicaid Other | Admitting: Student in an Organized Health Care Education/Training Program

## 2020-08-25 DIAGNOSIS — Z3403 Encounter for supervision of normal first pregnancy, third trimester: Secondary | ICD-10-CM | POA: Diagnosis not present

## 2020-08-25 DIAGNOSIS — Z3A36 36 weeks gestation of pregnancy: Secondary | ICD-10-CM

## 2020-08-25 NOTE — Patient Instructions (Signed)
It was a pleasure to see you today!  To summarize our discussion for this visit:  We checked today for infections that are common towards the end of pregnancy so we can best prepare for delivery. I will message you with your results.   You're almost there and doing great! Head down.  Some additional health maintenance measures we should update are: Health Maintenance Due  Topic Date Due  . COVID-19 Vaccine (1) Never done  . INFLUENZA VACCINE  03/02/2020  .    Please return to our clinic to see me in 1 week!  Call the clinic at [phone] if your symptoms worsen or you have any concerns.   Thank you for allowing me to take part in your care,  Dr. Jamelle Rushing   Prelabor Rupture and Preterm Prelabor Rupture of Membranes  A sac made up of membranes and fluid surrounds your baby in the womb (uterus). Rupture of membranes is when this sac breaks open. This is also known as your "water breaking." When this sac breaks before labor starts, it is called prelabor rupture of membranes (PROM). If this happens before 37 weeks of being pregnant, it is called preterm prelabor rupture of membranes (PPROM). PPROM is serious. It creates health dangers for the mother and the baby. These include a higher risk of:  Needing to have a cesarean delivery. This is also called a C-section.  A serious infection for both the mother and the baby.  A lung problem called respiratory distress syndrome in the baby.  The baby's lungs not growing as much as normal.  Bleeding in the baby's brain.  The baby dying. PPROM needs medical care right away. What are the causes? When PROM happens at 37 weeks of pregnancy or later, it is usually caused by normal weakening of the membranes along with contractions. PPROM is usually caused by infection. In many cases, the cause is not known. What increases the risk? PPROM is more likely to happen in women who:  Have an infection.  Have had PPROM  before.  Have a cervix that is short. The cervix is the lowest part of the womb.  Have bleeding during the second or third trimester.  Have a low BMI. This is a measure of body fat.  Smoke.  Use drugs. What are the signs or symptoms? Signs of PROM and PPROM include:  A sudden gush of fluid from the vagina.  A slow leak of fluid from the vagina.  Your underwear being wet often. How is this treated? Treatment will depend on many things, including how far along you are in your pregnancy. It may include:  Taking steps to cause you to start labor. This may be done if: ? You have PROM. In this case, your doctor may try to start labor within 24 hours if you are not having contractions. ? You have PPROM that happens after you have reached the 34th week of pregnancy. Your doctor may try to start labor if you are not having contractions. ? You have PPROM that happens before the 34th week of pregnancy and there are problems for you or the baby.  Watching you and the baby closely for signs of infection or other problems.  Medicines, such as: ? An antibiotic to lower the chances of getting an infection. ? A steroid to help the baby's lungs develop faster. ? A medicine to help prevent cerebral palsy in your baby. Follow these instructions at home: You may need to stay in the  hospital. If you are allowed to go home, follow instructions from your doctor. Make sure you:  Rest as told by your doctor.  Take over-the-counter and prescription medicines only as told by your doctor.  Do not use any products that contain nicotine or tobacco. These include cigarettes, e-cigarettes, and chewing tobacco. If you need help quitting, ask your doctor.  Do not drink alcohol.  Return to the hospital as told by your doctor. Contact a doctor if:  You have a sudden gush or slow leaking of fluid from the vagina after 37 weeks of pregnancy.  You have constant wet underwear after 37 weeks of  pregnancy. Get help right away if:  Your water breaks before you are [redacted] weeks pregnant.  You have signs of an infection, such as: ? Fever. ? Chills. ? Body aches. ? Belly pain.  You have signs of labor, such as: ? Belly pain. ? Cramping that feels like menstrual cramps.  You have bleeding from the vagina.  The color of your vaginal fluid changes from clear to green, brown, or bloody.  You see the umbilical cord sticking out from your vagina or you feel the cord in your vagina. Summary  When your water breaks before labor starts, it is called premature rupture of membranes (PROM).  When PROM happens before 37 weeks of pregnancy, it is called preterm premature rupture of membranes (PPROM).  PPROM creates health dangers for the mother and the baby.  Follow instructions from your doctor about when to seek help. This information is not intended to replace advice given to you by your health care provider. Make sure you discuss any questions you have with your health care provider. Document Revised: 08/09/2019 Document Reviewed: 08/09/2019 Elsevier Patient Education  2021 [employer].

## 2020-08-25 NOTE — Progress Notes (Signed)
  [facility] Prenatal Visit  Angela Johnston is a 20 y.o. G1P0 at [redacted]w[redacted]d here for routine follow up. She is dated by early ultrasound.  She reports no bleeding, no contractions, no cramping and no leaking. She reports fetal movement. She denies vaginal bleeding, contractions, or loss of fluid. See flow sheet for details.  There were no vitals filed for this visit.  O: cervical exam showed thick green discharge from cervical os which was closed.   A/P: Pregnancy at [redacted]w[redacted]d.  Doing well.   1. Routine prenatal care  . Dating reviewed, dating tab is correct . Fetal heart tones Appropriate . Fundal height within expected range.  . Fetal position confirmed Vertex using Ultrasound .  Marland Kitchen GBS collected today. .  . Repeat GC/CT collected today.  . The patient does not have a history of HSV and valacyclovir is not indicated at this time.  . Pregnancy education regarding preterm labor, fetal movement,  benefits of breastfeeding, contraception, fetal growth, expected weight gain, mucous plug, membrane rupture, and safe infant sleep were discussed.    2. Pregnancy issues include the following and were addressed as appropriate today:  . Problem list and pregnancy box updated: Yes.  Follow up 1 week.

## 2020-08-27 LAB — CERVICOVAGINAL ANCILLARY ONLY
Bacterial Vaginitis (gardnerella): NEGATIVE
Candida Glabrata: NEGATIVE
Candida Vaginitis: NEGATIVE
Chlamydia: NEGATIVE
Comment: NEGATIVE
Comment: NEGATIVE
Comment: NEGATIVE
Comment: NEGATIVE
Comment: NEGATIVE
Comment: NORMAL
Neisseria Gonorrhea: NEGATIVE
Trichomonas: NEGATIVE

## 2020-08-29 LAB — CULTURE, BETA STREP (GROUP B ONLY): Strep Gp B Culture: NEGATIVE

## 2020-08-30 ENCOUNTER — Encounter: Payer: Self-pay | Admitting: Student in an Organized Health Care Education/Training Program

## 2020-09-03 ENCOUNTER — Ambulatory Visit (INDEPENDENT_AMBULATORY_CARE_PROVIDER_SITE_OTHER): Payer: Medicaid Other | Admitting: Student in an Organized Health Care Education/Training Program

## 2020-09-03 ENCOUNTER — Other Ambulatory Visit: Payer: Self-pay

## 2020-09-03 VITALS — BP 123/75 | HR 75 | Wt 230.8 lb

## 2020-09-03 DIAGNOSIS — Z3483 Encounter for supervision of other normal pregnancy, third trimester: Secondary | ICD-10-CM

## 2020-09-03 DIAGNOSIS — O99013 Anemia complicating pregnancy, third trimester: Secondary | ICD-10-CM | POA: Diagnosis not present

## 2020-09-03 NOTE — Patient Instructions (Addendum)
It was a pleasure to see you today!  To summarize our discussion for this visit:  Your pregnancy is growing really well.   You are now considered full-term and should be on the look out for signs of early labor which we discussed at your appointment.  We are checking your blood count today to monitor your anemia.  I recommend you try to take your iron supplement every day and can take a fiber supplement and MiraLAX every day to help combat the constipation.  Some additional health maintenance measures we should update are: Health Maintenance Due  Topic Date Due  . COVID-19 Vaccine (1) Never done  . INFLUENZA VACCINE  03/02/2020  .   Please return to our clinic to see me in 1 week.  Call the clinic at [phone] if your symptoms worsen or you have any concerns.  Thank you for allowing me to take part in your care,  Dr. Jamelle Rushing   Preterm Labor Pregnancy normally lasts 39-41 weeks. Preterm labor is when labor starts before you have been pregnant for 37 weeks. Babies who are born too early may have problems with blood sugar, body temperature, heart, and breathing. These problems may be very serious in babies who are born before 34 weeks of pregnancy. What are the causes? The cause of this condition is not known. What increases the risk? You are more likely to have preterm labor if:  You have medical problems, now or in the past.  You have problems now or in your past pregnancies.  You have lifestyle problems. Medical history  You have problems of the womb (uterus).  You have an infection, including infections you get from sex.  You have problems that do not go away, such as: ? Blood clots. ? High blood pressure. ? High blood sugar.  You have low body weight or too much body weight. Present and past pregnancies  You have had preterm labor before.  You are pregnant with two babies or more.  You have a condition in which the placenta covers your  cervix.  You waited less than 6 months between giving birth and becoming pregnant again.  Your unborn baby has some problems.  You have bleeding from your vagina.  You became pregnant by a method called IVF. Lifestyle  You smoke.  You drink alcohol.  You use drugs.  You have stress.  You have abuse in your home.  You come in contact with chemicals that harm the body (pollutants). Other factors  You are younger than 17 years or older than 35 years. What are the signs or symptoms? Symptoms of this condition include:  Cramps. The cramps may feel like cramps from a period.  You may have watery poop (diarrhea).  Pain in the belly (abdomen).  Pain in the lower back.  Regular contractions. It may feel like your belly is getting tighter.  Pressure in the lower belly.  More fluid leaking from the vagina. The fluid may be watery or bloody.  Water breaking. How is this treated? Treatment for this condition depends on your health, the health of your baby, and how old your pregnancy is. It may include:  Taking medicines, such as: ? Hormone medicines. ? Medicines to stop contractions. ? Medicines to help mature the baby's lungs. ? Medicines to prevent your baby from getting cerebral palsy.  Bed rest. If the labor happens before 34 weeks of pregnancy, you may need to stay in the hospital.  Delivering the baby. Follow  these instructions at home:  Do not use any products that contain nicotine or tobacco, such as cigarettes, e-cigarettes, and chewing tobacco. If you need help quitting, ask your doctor.  Do not drink alcohol.  Take over-the-counter and prescription medicines only as told by your doctor.  Rest as told by your doctor.  Return to your activities as told by your doctor. Ask your doctor what activities are safe for you.  Keep all follow-up visits as told by your doctor. This is important.   How is this prevented? To have a healthy pregnancy:  Do not use  street drugs.  Do not use any medicines unless you ask your doctor if they are safe for you.  Talk with your doctor before taking any herbal supplements.  Make sure you gain enough weight.  Watch for infection. If you think you might have an infection, get it checked right away. Symptoms of infection may include: ? Fever. ? Vaginal discharge. ? Pain or burning when you pee. ? Needing to pee urgently. ? Needing to pee often. ? Peeing small amounts often. ? Blood in your pee. ? Pee that smells bad or unusual.  Tell your doctor if you have gone into preterm labor before. Contact a doctor if:  You think you are going into preterm labor.  You have symptoms of preterm labor.  You have symptoms of infection. Get help right away if:  You are having painful contractions every 5 minutes or less.  Your water breaks. Summary  Preterm labor is labor that starts before you reach 37 weeks of pregnancy.  Your baby may have problems if delivered early.  The cause of preterm labor is not known. Having problems of the womb (uterus), an infection, or bleeding during pregnancy increases the risk.  Contact a doctor if you have signs or symptoms of preterm labor. This information is not intended to replace advice given to you by your health care provider. Make sure you discuss any questions you have with your health care provider. Document Revised: 08/21/2019 Document Reviewed: 08/21/2019 Elsevier Patient Education  2021 [employer].

## 2020-09-03 NOTE — Progress Notes (Signed)
  [facility] Prenatal Visit  INDYA OLIVERIA is a 20 y.o. G1P0 at [redacted]w[redacted]d here for routine follow up. She is dated by early ultrasound.  She reports backache, no bleeding, no contractions, no cramping and no leaking. She reports fetal movement. She denies vaginal bleeding, contractions, or loss of fluid. See flow sheet for details.  O: Vitals:   09/03/20 1051  BP: 123/75  Pulse: 75  General: NAD, pleasant, able to participate in exam FHR: 145 Fundal height: 37cm Extremities: trace edema. WWP. Skin: warm and dry, no rashes noted Neuro: alert and oriented, no focal deficits Psych: Normal affect and mood  A/P: Pregnancy at [redacted]w[redacted]d.  Doing well.   1. Routine prenatal care  . Dating reviewed, dating tab is correct . Fetal heart tones Appropriate . Fundal height within expected range.  . Fetal position confirmed Vertex using [place] last visit . Infant feeding choice: Formula feeding  . Contraception choice: Undecided   . Pregnancy education regarding preterm labor, fetal movement,  benefits of breastfeeding, contraception, fetal growth, expected weight gain, and safe infant sleep were discussed.   2. Pregnancy issues include the following and were addressed as appropriate today: . Anemia- not adherent with iron supplement due to constipation. Repeat CBC today. Encouraged her to take iron and also miralax/fiber. . Problem list and pregnancy box updated: Yes.  Spent considerable amount of time reviewing signs of labor onset, and procedure of presenting to MAU for delivery.  Follow up 1 week.   Leeroy Bock, DO [facility]

## 2020-09-04 LAB — CBC
Hematocrit: 31.7 % — ABNORMAL LOW (ref 34.0–46.6)
Hemoglobin: 10.4 g/dL — ABNORMAL LOW (ref 11.1–15.9)
MCH: 24.6 pg — ABNORMAL LOW (ref 26.6–33.0)
MCHC: 32.8 g/dL (ref 31.5–35.7)
MCV: 75 fL — ABNORMAL LOW (ref 79–97)
Platelets: 188 10*3/uL (ref 150–450)
RBC: 4.22 x10E6/uL (ref 3.77–5.28)
RDW: 16.3 % — ABNORMAL HIGH (ref 11.7–15.4)
WBC: 8.2 10*3/uL (ref 3.4–10.8)

## 2020-09-10 ENCOUNTER — Other Ambulatory Visit: Payer: Self-pay

## 2020-09-10 ENCOUNTER — Ambulatory Visit (INDEPENDENT_AMBULATORY_CARE_PROVIDER_SITE_OTHER): Payer: Medicaid Other | Admitting: Student in an Organized Health Care Education/Training Program

## 2020-09-10 VITALS — BP 130/60 | HR 98 | Wt 236.4 lb

## 2020-09-10 DIAGNOSIS — Z3493 Encounter for supervision of normal pregnancy, unspecified, third trimester: Secondary | ICD-10-CM

## 2020-09-10 NOTE — Patient Instructions (Signed)
It was a pleasure to see you today!  To summarize our discussion for this visit:  Your pregnancy is growing well. Almost there!!  For heart burn you can use over the counter medications like tums, sucralfate, and pepcid. Also, try to avoid spicy foods and laying down after you eat.  I'll see you in a week.  Please stop by to schedule a COVID vaccine to help protect you and your baby.   Call the clinic at [phone] if your symptoms worsen or you have any concerns.   Thank you for allowing me to take part in your care,  Dr. Jamelle Rushing  Heartburn During Pregnancy  Heartburn is a type of pain or discomfort in the throat or chest. It may cause a burning feeling. It happens when stomach acid backs up into the part of the body that moves food from your mouth to your stomach (esophagus). This condition is also called acid reflux. Heartburn is common during pregnancy. It usually goes away or gets better after giving birth. What are the causes? This condition is caused by stomach acid that backs up into the part of the body that moves food from your mouth to your stomach. Acid can back up because of:  Changing amounts of hormones in the body.  Large meals.  Certain foods and drinks.  Exercise.  More acid being made in the stomach. What increases the risk? You are more likely to develop this condition if:  You had heartburn before you became pregnant.  You have been pregnant more than once before.  You are overweight or obese. Heartburn is also likely to happen as you get further along in your pregnancy. The risk is higher in the last 3 months before birth (third trimester). What are the signs or symptoms? Symptoms of this condition include:  Burning pain in the chest or lower throat.  A bitter taste in the mouth.  Coughing.  Problems swallowing.  Vomiting.  A hoarse voice.  Asthma. Symptoms may get worse when you lie down or bend over. You may feel worse at  night. How is this treated? Treatment for this condition depends on how bad your symptoms are. Your doctor may ask you to:  Take over-the-counter medicines for mild heartburn. These medicines include antacids or acid reducers.  Take prescription medicines to reduce stomach acid or to protect your stomach.  Change your diet.  Raise the head of your bed so it is higher than the foot of the bed. Follow these instructions at home: Eating and drinking  Do not drink alcohol while you are pregnant.  Learn which foods and drinks make you feel worse, and avoid them.  Eat small meals often, instead of large meals.  Avoid drinking a lot of liquid with your meals.  Avoid eating meals during the 2-3 hours before you go to bed.  Avoid lying down right after you eat.  Do not exercise right after you eat. Drinks to avoid  Coffee and tea (with or without caffeine).  Energy drinks and sports drinks.  Carbonated drinks or sodas.  Citrus fruit juices. Foods to avoid  Chocolate and cocoa.  Peppermint and mint flavorings.  Garlic, onions, and horseradish.  Spicy foods and foods that have a lot of acid in them. These include peppers, chili powder, curry powder, vinegar, hot sauces, and barbecue sauce.  Citrus fruits, such as oranges, lemons, and limes.  Tomato-based foods, such as red sauce, chili, and salsa.  Fried and fatty foods, such  as donuts, french fries, potato chips, and high-fat dressings.  High-fat meats, such as hot dogs, precooked or cured meats, sausage, ham, and bacon.  High-fat dairy items, such as whole milk, butter, and cheese. Medicines  Take over-the-counter and prescription medicines only as told by your doctor.  Do not take aspirin or NSAIDs, such as ibuprofen, unless your doctor tells you to do that.  Your doctor may tell you to avoid medicines that have sodium bicarbonate in them. General instructions  If told, raise the head of your bed about 6 inches  (15 cm). You can do this by putting blocks under the legs. Sleeping with more pillows does not help with heartburn.  Do not use any products that contain nicotine or tobacco, such as cigarettes, e-cigarettes, and chewing tobacco. If you need help quitting, ask your doctor.  Wear loose-fitting clothing.  Try to lower your stress, such as with yoga or meditation. If you need help, ask your doctor.  Stay at a healthy weight. If you are overweight, work with your doctor to safely manage your weight.  Keep all follow-up visits as told by your doctor. This is important. Where to find more information  American Pregnancy Association: americanpregnancy.org Contact a doctor if:  You get new symptoms.  Your symptoms do not get better with treatment.  You lose weight and you do not know why.  You have trouble swallowing.  You make loud sounds when you breathe (wheeze).  You have a cough that does not go away.  You have heartburn often for more than 2 weeks.  You feel like you may vomit (nausea), or you vomit, and this does not get better with treatment.  You have pain in your belly (abdomen). Get help right away if:  You have very bad chest pain that spreads to your arm, neck, or jaw.  You feel sweaty, dizzy, or light-headed.  You have trouble breathing.  You have pain when swallowing.  You vomit, and your vomit looks like blood or coffee grounds.  Your poop (stool) is bloody or black. Summary  Heartburn in pregnancy is common, especially during the last 3 months before birth.  This condition is caused by stomach acid backing up into the part of the body that moves food from the mouth to the stomach.  This condition can be treated with medicines, changes to your diet, or raising the head of your bed.  Contact a doctor if your symptoms do not go away or you get new symptoms. This information is not intended to replace advice given to you by your health care provider. Make  sure you discuss any questions you have with your health care provider. Document Revised: 04/11/2019 Document Reviewed: 04/11/2019 Elsevier Patient Education  2021 [employer].

## 2020-09-10 NOTE — Progress Notes (Signed)
°  [facility] Prenatal Visit  Angela Johnston is a 20 y.o. G1P0 at [redacted]w[redacted]d here for routine follow up. Angela Johnston is dated by early ultrasound.  Angela Johnston reports no complaints and diarrhea. Angela Johnston reports fetal movement. Angela Johnston denies vaginal bleeding, contractions, or loss of fluid. See flow sheet for details.  Vitals:   09/10/20 1016  BP: 130/60  Pulse: 98   A/P: Pregnancy at [redacted]w[redacted]d.  Doing well.   1. Routine prenatal care:   Dating reviewed, dating tab is correct  Fetal heart tones Appropriate  Fundal height within expected range.   Infant feeding choice: Formula feeding But is open to seeing lactation consultant after delivery to consider breast-feeding  Contraception choice: Undecided Still but thinks Angela Johnston will likely want to take OCPs  Pain control in labor discussed and patient desires undecided still.   Influenza vaccine not administered as patient declined, will continue to discuss.    Tdap previously administered between 27-36 weeks   Pregnancy education regarding labor, fetal movement,  benefits of breastfeeding, contraception, and safe infant sleep were discussed.   Labor and fetal movement precautions reviewed.  Induction of labor discussed. Scheduled for induction at approximately 41 weeks.  2. Pregnancy issues include the following and were addressed as appropriate today:   Confirmed that patient is sickle cell trait and Angela Johnston is aware of this.  Angela Johnston believes that her partner is negative. - anemia- patient has been adherent with iron supplements now and no longer has consitpation, but instead has mild diarrhea. Recommended Angela Johnston stop taking her miralax. - I let patient know that if Angela Johnston would like for me to deliver her that I will gladly come to deliver her if I'm able with my schedule. Angela Johnston expressed that Angela Johnston would like that.   - please contact me when admitted for labor  - discussed again covid and flu vaccine and patient declined again but will continue to consider covid  vaccine   Problem list and pregnancy box updated: Yes.   Follow up 1 week.

## 2020-09-14 NOTE — Progress Notes (Signed)
  Patient Name: Angela Johnston Date of Birth: 2001/06/09 Date of Visit: 09/17/20 PCP: Leeroy Bock, DO Third Trimester Prenatal Visit   Chief Complaint: prenatal care  Subjective: Angela Johnston is a pleasant G1P0 at  [redacted]w[redacted]d dated by early ultrasound.She has no unusual complaints today.  She denies vaginal bleeding or discharge. She reports good fetal movement. She denies contractions. Notes feeling some pressure at times but these are irregular and not very strong - they do not take her breath away or cause her to stop doing her normal activities.    ROS: per HPI.   I have reviewed the patient's medical, surgical, family, and social history as appropriate.   Objective: Vitals:   09/17/20 0949  BP: 128/78  Pulse: 94   Last Weight  Most recent update: 09/17/2020  9:51 AM   Weight  108.1 kg (238 lb 6.4 oz)           See flow sheet for exam.  Fetal heart tones documented in flow sheet  Fundal height documented in flow sheet   General:  Alert, oriented and cooperative. Patient is in no acute distress.  Skin: Skin is warm and dry. No rash noted.   Cardiovascular: Normal heart rate noted  Respiratory: Normal respiratory effort, no problems with respiration noted  Abdomen: Soft, gravid, appropriate for gestational age.  Pain/Pressure: Absent     Pelvic: Cervical exam deferred       Discussed obtaining but patient declined.  Extremities: Normal range of motion.     Mental Status: Normal mood and affect. Normal behavior. Normal judgment and thought content.   Assessment/Plan: Angela Johnston is a pleasant G1P0 at [redacted]w[redacted]d presenting for routine third trimester prenatal care. Overall she is doing well.   . Dating reviewed, dating tab is correct . Fetal heart tones: Appropriate . Fundal height: within expected range.  . Problem list updated: Yes . The patient does not have a history of HSV and valacyclovir is not at this time.  . The patient does not have a history of Cesarean  delivery and no referral to [facility] is indicated  . Infant feeding choice: Both  . Contraception choice: Undecided   . Labor Pain Management: Epidural  . Infant circumcision desired not applicable - Girl (Angela Johnston)  . Influenza vaccine not administered as patient declined, will continue to discuss.   . Tdap previously administered  . COVID: Declined  . Pregnancy education regarding benefits of breastfeeding, contraception, fetal growth, expected weight gain, and safe infant sleep were discussed.  . Preterm labor and fetal movement precautions reviewed.  Marland Kitchen BPP scheduled for 09/24/20 at 3pm at 40 weeks . Induction scheduled for 09/27/20 at 41 weeks  Follow up 1 week. BPP scheduled as she will be post dates.  Problem List: 1. Varicella nonimmune: Will need vaccine post partum 2. Anemia in Current Pregnancy: Last Hgb 10.5 on 07/16/2020. Currently on iron supplement.  3. Obesity 4. Sickle Cell trait   Anticipatory Guidance and Prenatal Education provided on the following topics: - Recommended continuing Prenatal vitamin  - Discussed options for Contraception postpartum - Discussed mode of delivery - Discussed pain control in labor - Discussed  breastfeeding and benefits for infant  - Discussed safe sleep recommendations and car seat recommendations  - Problem list and prenatal box updated   [name], DO [facility], PGY3 09/17/2020 10:22 AM

## 2020-09-17 ENCOUNTER — Ambulatory Visit: Payer: Medicaid Other | Admitting: Family Medicine

## 2020-09-17 ENCOUNTER — Other Ambulatory Visit: Payer: Self-pay

## 2020-09-17 ENCOUNTER — Other Ambulatory Visit: Payer: Self-pay | Admitting: Advanced Practice Midwife

## 2020-09-17 ENCOUNTER — Ambulatory Visit (INDEPENDENT_AMBULATORY_CARE_PROVIDER_SITE_OTHER): Payer: Medicaid Other | Admitting: Family Medicine

## 2020-09-17 VITALS — BP 128/78 | HR 94 | Wt 238.4 lb

## 2020-09-17 DIAGNOSIS — O99213 Obesity complicating pregnancy, third trimester: Secondary | ICD-10-CM

## 2020-09-17 DIAGNOSIS — O99013 Anemia complicating pregnancy, third trimester: Secondary | ICD-10-CM | POA: Diagnosis not present

## 2020-09-17 DIAGNOSIS — D573 Sickle-cell trait: Secondary | ICD-10-CM | POA: Diagnosis not present

## 2020-09-17 DIAGNOSIS — Z3403 Encounter for supervision of normal first pregnancy, third trimester: Secondary | ICD-10-CM

## 2020-09-17 DIAGNOSIS — Z3A39 39 weeks gestation of pregnancy: Secondary | ICD-10-CM

## 2020-09-17 NOTE — Patient Instructions (Signed)
Reasons to return to MAU: 1.  Contractions are  5 minutes apart or less, each last 1 minute, these have been going on for 1-2 hours, and you cannot walk or talk during them 2.  You have a large gush of fluid, or a trickle of fluid that will not stop and you have to wear a pad 3.  You have bleeding that is bright red, heavier than spotting--like menstrual bleeding (spotting can be normal in early labor or after a check of your cervix) 4.  You do not feel the baby moving like he/she normally does  Kick Count: - Drink something cold or sugary - lie in a quiet place for 1 hour.  - count kicks for 1 hour  - Should have 10 kicks in 1 hour - Instructed pt that if she feels less than 5 kicks in this time she should go to the MAU for evaluation    

## 2020-09-18 ENCOUNTER — Encounter (HOSPITAL_COMMUNITY): Payer: Self-pay | Admitting: *Deleted

## 2020-09-18 ENCOUNTER — Telehealth (HOSPITAL_COMMUNITY): Payer: Self-pay | Admitting: *Deleted

## 2020-09-18 NOTE — Telephone Encounter (Signed)
Preadmission screen  

## 2020-09-20 ENCOUNTER — Other Ambulatory Visit: Payer: Self-pay

## 2020-09-20 ENCOUNTER — Inpatient Hospital Stay (HOSPITAL_COMMUNITY): Payer: Medicaid Other | Admitting: Anesthesiology

## 2020-09-20 ENCOUNTER — Encounter (HOSPITAL_COMMUNITY): Payer: Self-pay | Admitting: Obstetrics & Gynecology

## 2020-09-20 ENCOUNTER — Inpatient Hospital Stay (HOSPITAL_COMMUNITY)
Admission: AD | Admit: 2020-09-20 | Discharge: 2020-09-24 | DRG: 788 | Disposition: A | Discharge status: Home / Self Care | Payer: Medicaid Other | Attending: Obstetrics & Gynecology | Admitting: Obstetrics & Gynecology

## 2020-09-20 DIAGNOSIS — Z3A40 40 weeks gestation of pregnancy: Secondary | ICD-10-CM | POA: Diagnosis not present

## 2020-09-20 DIAGNOSIS — Z2839 Other underimmunization status: Secondary | ICD-10-CM

## 2020-09-20 DIAGNOSIS — O99214 Obesity complicating childbirth: Secondary | ICD-10-CM | POA: Diagnosis not present

## 2020-09-20 DIAGNOSIS — O368130 Decreased fetal movements, third trimester, not applicable or unspecified: Secondary | ICD-10-CM | POA: Diagnosis not present

## 2020-09-20 DIAGNOSIS — O99013 Anemia complicating pregnancy, third trimester: Secondary | ICD-10-CM | POA: Diagnosis present

## 2020-09-20 DIAGNOSIS — Z20822 Contact with and (suspected) exposure to covid-19: Secondary | ICD-10-CM | POA: Diagnosis present

## 2020-09-20 DIAGNOSIS — E669 Obesity, unspecified: Secondary | ICD-10-CM | POA: Diagnosis not present

## 2020-09-20 DIAGNOSIS — O9902 Anemia complicating childbirth: Secondary | ICD-10-CM | POA: Diagnosis not present

## 2020-09-20 DIAGNOSIS — O99213 Obesity complicating pregnancy, third trimester: Secondary | ICD-10-CM | POA: Diagnosis present

## 2020-09-20 DIAGNOSIS — O163 Unspecified maternal hypertension, third trimester: Secondary | ICD-10-CM | POA: Diagnosis not present

## 2020-09-20 DIAGNOSIS — O164 Unspecified maternal hypertension, complicating childbirth: Secondary | ICD-10-CM | POA: Diagnosis not present

## 2020-09-20 DIAGNOSIS — O1414 Severe pre-eclampsia complicating childbirth: Principal | ICD-10-CM | POA: Diagnosis present

## 2020-09-20 DIAGNOSIS — O1410 Severe pre-eclampsia, unspecified trimester: Secondary | ICD-10-CM | POA: Diagnosis present

## 2020-09-20 DIAGNOSIS — O09899 Supervision of other high risk pregnancies, unspecified trimester: Secondary | ICD-10-CM

## 2020-09-20 DIAGNOSIS — D573 Sickle-cell trait: Secondary | ICD-10-CM | POA: Diagnosis present

## 2020-09-20 DIAGNOSIS — Z3490 Encounter for supervision of normal pregnancy, unspecified, unspecified trimester: Secondary | ICD-10-CM

## 2020-09-20 LAB — URINALYSIS, ROUTINE W REFLEX MICROSCOPIC
Bilirubin Urine: NEGATIVE
Glucose, UA: NEGATIVE mg/dL
Ketones, ur: NEGATIVE mg/dL
Nitrite: NEGATIVE
Protein, ur: >=300 mg/dL — AB
Specific Gravity, Urine: 1.01 (ref 1.005–1.030)
pH: 6 (ref 5.0–8.0)

## 2020-09-20 LAB — COMPREHENSIVE METABOLIC PANEL
ALT: 13 U/L (ref 0–44)
AST: 32 U/L (ref 15–41)
Albumin: 2.8 g/dL — ABNORMAL LOW (ref 3.5–5.0)
Alkaline Phosphatase: 212 U/L — ABNORMAL HIGH (ref 38–126)
Anion gap: 10 (ref 5–15)
BUN: <5 mg/dL — ABNORMAL LOW (ref 6–20)
CO2: 20 mmol/L — ABNORMAL LOW (ref 22–32)
Calcium: 9 mg/dL (ref 8.9–10.3)
Chloride: 106 mmol/L (ref 98–111)
Creatinine, Ser: 0.86 mg/dL (ref 0.44–1.00)
GFR, Estimated: >60 mL/min (ref >60)
Glucose, Bld: 80 mg/dL (ref 70–99)
Potassium: 4.4 mmol/L (ref 3.5–5.1)
Sodium: 136 mmol/L (ref 135–145)
Total Bilirubin: 0.7 mg/dL (ref 0.3–1.2)
Total Protein: 6.3 g/dL — ABNORMAL LOW (ref 6.5–8.1)

## 2020-09-20 LAB — CBC
HCT: 34.2 % — ABNORMAL LOW (ref 36.0–46.0)
Hemoglobin: 10.8 g/dL — ABNORMAL LOW (ref 12.0–15.0)
MCH: 23.9 pg — ABNORMAL LOW (ref 26.0–34.0)
MCHC: 31.6 g/dL (ref 30.0–36.0)
MCV: 75.7 fL — ABNORMAL LOW (ref 80.0–100.0)
Platelets: 175 10*3/uL (ref 150–400)
RBC: 4.52 MIL/uL (ref 3.87–5.11)
RDW: 17.3 % — ABNORMAL HIGH (ref 11.5–15.5)
WBC: 8.5 10*3/uL (ref 4.0–10.5)
nRBC: 0 % (ref 0.0–0.2)

## 2020-09-20 LAB — PROTEIN / CREATININE RATIO, URINE
Creatinine, Urine: 103.87 mg/dL
Protein Creatinine Ratio: 4.97 mg/mg{Cre} — ABNORMAL HIGH (ref 0.00–0.15)
Total Protein, Urine: 516 mg/dL

## 2020-09-20 LAB — TYPE AND SCREEN
ABO/RH(D): B POS
Antibody Screen: NEGATIVE

## 2020-09-20 LAB — RESP PANEL BY RT-PCR (FLU A&B, COVID) ARPGX2
Influenza A by PCR: NEGATIVE
Influenza B by PCR: NEGATIVE
SARS Coronavirus 2 by RT PCR: NEGATIVE

## 2020-09-20 MED ORDER — DIPHENHYDRAMINE HCL 50 MG/ML IJ SOLN: 12.5000 mg | INTRAMUSCULAR | Status: DC | PRN

## 2020-09-20 MED ORDER — PHENYLEPHRINE 40 MCG/ML (10ML) SYRINGE FOR IV PUSH (FOR BLOOD PRESSURE SUPPORT)
80.0000 ug | PREFILLED_SYRINGE | INTRAVENOUS | Status: DC | PRN
  Filled 2020-09-20: qty 10

## 2020-09-20 MED ORDER — FENTANYL-BUPIVACAINE-NACL 0.5-0.125-0.9 MG/250ML-% EP SOLN
12.0000 mL/h | EPIDURAL | Status: DC | PRN
  Filled 2020-09-20 (×2): qty 250

## 2020-09-20 MED ORDER — FENTANYL-BUPIVACAINE-NACL 0.5-0.125-0.9 MG/250ML-% EP SOLN
EPIDURAL | Status: DC | PRN
  Administered 2020-09-20: 12 mL/h via EPIDURAL
  Administered 2020-09-21: 500 ug via EPIDURAL

## 2020-09-20 MED ORDER — LABETALOL HCL 5 MG/ML IV SOLN
20.0000 mg | INTRAVENOUS | Status: DC | PRN
  Administered 2020-09-20: 20 mg via INTRAVENOUS

## 2020-09-20 MED ORDER — FLEET ENEMA 7-19 GM/118ML RE ENEM: 1.0000 | ENEMA | RECTAL | Status: DC | PRN

## 2020-09-20 MED ORDER — EPHEDRINE 5 MG/ML INJ: 10.0000 mg | INTRAVENOUS | Status: DC | PRN

## 2020-09-20 MED ORDER — MISOPROSTOL 50MCG HALF TABLET
50.0000 ug | ORAL_TABLET | ORAL | Status: DC | PRN
  Administered 2020-09-20: 50 ug via BUCCAL
  Filled 2020-09-20: qty 1

## 2020-09-20 MED ORDER — OXYCODONE-ACETAMINOPHEN 5-325 MG PO TABS: 1.0000 | ORAL_TABLET | ORAL | Status: DC | PRN

## 2020-09-20 MED ORDER — TERBUTALINE SULFATE 1 MG/ML IJ SOLN: 0.2500 mg | Freq: Once | INTRAMUSCULAR | Status: DC | PRN

## 2020-09-20 MED ORDER — FENTANYL CITRATE (PF) 100 MCG/2ML IJ SOLN
50.0000 ug | INTRAMUSCULAR | Status: DC | PRN
  Administered 2020-09-20: 100 ug via INTRAVENOUS
  Filled 2020-09-20: qty 2

## 2020-09-20 MED ORDER — SOD CITRATE-CITRIC ACID 500-334 MG/5ML PO SOLN
30.0000 mL | ORAL | Status: DC | PRN
  Administered 2020-09-21: 30 mL via ORAL
  Filled 2020-09-20: qty 15

## 2020-09-20 MED ORDER — PHENYLEPHRINE 40 MCG/ML (10ML) SYRINGE FOR IV PUSH (FOR BLOOD PRESSURE SUPPORT)
80.0000 ug | PREFILLED_SYRINGE | INTRAVENOUS | Status: DC | PRN
  Administered 2020-09-20 (×2): 80 ug via INTRAVENOUS

## 2020-09-20 MED ORDER — LIDOCAINE HCL (PF) 1 % IJ SOLN
30.0000 mL | INTRAMUSCULAR | Status: DC | PRN
Start: 2020-09-20 — End: 2020-09-21

## 2020-09-20 MED ORDER — OXYCODONE-ACETAMINOPHEN 5-325 MG PO TABS: 2.0000 | ORAL_TABLET | ORAL | Status: DC | PRN

## 2020-09-20 MED ORDER — LACTATED RINGERS IV SOLN: INTRAVENOUS | Status: DC

## 2020-09-20 MED ORDER — HYDRALAZINE HCL 20 MG/ML IJ SOLN: 10.0000 mg | INTRAMUSCULAR | Status: DC | PRN

## 2020-09-20 MED ORDER — HYDRALAZINE HCL 20 MG/ML IJ SOLN: 5.0000 mg | INTRAMUSCULAR | Status: DC | PRN

## 2020-09-20 MED ORDER — ONDANSETRON HCL 4 MG/2ML IJ SOLN
4.0000 mg | Freq: Four times a day (QID) | INTRAMUSCULAR | Status: DC | PRN
  Administered 2020-09-20 – 2020-09-21 (×2): 4 mg via INTRAVENOUS
  Filled 2020-09-20 (×2): qty 2

## 2020-09-20 MED ORDER — OXYTOCIN-SODIUM CHLORIDE 30-0.9 UT/500ML-% IV SOLN: 2.5000 [IU]/h | INTRAVENOUS | Status: DC

## 2020-09-20 MED ORDER — ACETAMINOPHEN 325 MG PO TABS: 650.0000 mg | ORAL_TABLET | ORAL | Status: DC | PRN

## 2020-09-20 MED ORDER — LABETALOL HCL 5 MG/ML IV SOLN
INTRAVENOUS | Status: AC
  Filled 2020-09-20: qty 4

## 2020-09-20 MED ORDER — MAGNESIUM SULFATE 40 GM/1000ML IV SOLN
2.0000 g/h | INTRAVENOUS | Status: DC
  Administered 2020-09-20: 2 g/h via INTRAVENOUS
  Administered 2020-09-21: 1 g/h via INTRAVENOUS
  Filled 2020-09-20 (×2): qty 1000

## 2020-09-20 MED ORDER — MAGNESIUM SULFATE BOLUS VIA INFUSION
4.0000 g | Freq: Once | INTRAVENOUS | Status: AC
  Administered 2020-09-20: 4 g via INTRAVENOUS
  Filled 2020-09-20: qty 1000

## 2020-09-20 MED ORDER — LABETALOL HCL 5 MG/ML IV SOLN
40.0000 mg | INTRAVENOUS | Status: DC | PRN
  Administered 2020-09-20: 40 mg via INTRAVENOUS
  Filled 2020-09-20: qty 8

## 2020-09-20 MED ORDER — LACTATED RINGERS IV SOLN
500.0000 mL | INTRAVENOUS | Status: DC | PRN
  Administered 2020-09-20: 500 mL via INTRAVENOUS

## 2020-09-20 MED ORDER — LACTATED RINGERS IV SOLN: 500.0000 mL | Freq: Once | INTRAVENOUS | Status: DC

## 2020-09-20 MED ORDER — OXYTOCIN BOLUS FROM INFUSION: 333.0000 mL | Freq: Once | INTRAVENOUS | Status: DC

## 2020-09-20 MED ORDER — OXYTOCIN-SODIUM CHLORIDE 30-0.9 UT/500ML-% IV SOLN
1.0000 m[IU]/min | INTRAVENOUS | Status: DC
  Administered 2020-09-20: 2 m[IU]/min via INTRAVENOUS
  Filled 2020-09-20: qty 500

## 2020-09-20 MED ORDER — LIDOCAINE HCL (PF) 1 % IJ SOLN
INTRAMUSCULAR | Status: DC | PRN
  Administered 2020-09-20: 10 mL via EPIDURAL
  Administered 2020-09-20: 2 mL via EPIDURAL

## 2020-09-20 NOTE — Anesthesia Preprocedure Evaluation (Signed)
Anesthesia Evaluation  Patient identified by MRN, date of birth, ID band Patient awake    Reviewed: Allergy & Precautions, NPO status , Patient's Chart, lab work & pertinent test results  Airway Mallampati: II  TM Distance: >3 FB Neck ROM: Full    Dental no notable dental hx.    Pulmonary neg pulmonary ROS,    Pulmonary exam normal breath sounds clear to auscultation       Cardiovascular hypertension (preE on mag ), Pt. on medications Normal cardiovascular exam Rhythm:Regular Rate:Normal     Neuro/Psych negative neurological ROS  negative psych ROS   GI/Hepatic negative GI ROS, Neg liver ROS,   Endo/Other  Morbid obesityBMI 41  Renal/GU negative Renal ROS  negative genitourinary   Musculoskeletal negative musculoskeletal ROS (+)   Abdominal (+) + obese,   Peds negative pediatric ROS (+)  Hematology  (+) Blood dyscrasia, Sickle cell trait and anemia , hct 34.2, pl t175   Anesthesia Other Findings   Reproductive/Obstetrics (+) Pregnancy                             Anesthesia Physical Anesthesia Plan  ASA: III and emergent  Anesthesia Plan: Epidural   Post-op Pain Management:    Induction:   PONV Risk Score and Plan: 2  Airway Management Planned: Natural Airway  Additional Equipment: None  Intra-op Plan:   Post-operative Plan:   Informed Consent: I have reviewed the patients History and Physical, chart, labs and discussed the procedure including the risks, benefits and alternatives for the proposed anesthesia with the patient or authorized representative who has indicated his/her understanding and acceptance.       Plan Discussed with:   Anesthesia Plan Comments:         Anesthesia Quick Evaluation

## 2020-09-20 NOTE — MAU Note (Signed)
Pt reports to mau with c/o abd cramping for the past hour.  Pt denies LOF or bleeding.   Pt also reports feeling baby move less than her normal.  States she felt regular movement earlier this morning. FHR 145 in triage.

## 2020-09-20 NOTE — H&P (Signed)
OBSTETRIC ADMISSION HISTORY AND PHYSICAL  Angela Johnston is a 20 y.o. female G1P0 with IUP at [redacted]w[redacted]d by 15 week u/s presenting for IOL for hypertension at term and decreased fetal movement. She reports +FMs, No LOF, no VB, no blurry vision, headaches or peripheral edema, and RUQ pain.  She plans on bottle feeding. She request pills for birth control. She received her prenatal care at [facility]   Dating: By 15 weeks u/s --->  Estimated Date of Delivery: 09/20/20  Nursing Staff Provider  Office Location  [facility]  Dating  [redacted]w[redacted]d sono on 04/04/20  Language  English Anatomy US  Normal, recommended f/u at 30-32 WGA  Flu Vaccine  Declined Genetic Screen  Quad: low risk 9/10   TDaP vaccine   07/23/20 Hgb A1C or  GTT Third trimester failed 1hr, passed 3hr  Rhogam  N/A   LAB RESULTS   Feeding Plan Both Blood Type B/Positive/-- (08/31 1039)   Contraception undecided Antibody Negative (08/31 1039)  Circumcision N/a female (Angela Johnston) Rubella 9.32 (08/31 1039)  Pediatrician  [facility] RPR Non Reactive (12/09 1044)   Support Person  HBsAg Negative (08/31 1039)   Prenatal Classes  HIV Non Reactive (12/09 1044)  BTL Consent  GBS Negative/-- (01/24 1546)(For PCN allergy, check sensitivities)   VBAC Consent N/A Pap N/A not yet 20  Resident PCP  C Anderson  Hgb Electro  + sickle cell trait 07/10/20  COVID Vax  Declined CF   Labor Pain Plan:  Epidural SMA     Waterbirth  N/A    Past Medical History: Past Medical History:  Diagnosis Date  . Medical history non-contributory     Past Surgical History: Past Surgical History:  Procedure Laterality Date  . NO PAST SURGERIES    . WISDOM TOOTH EXTRACTION      Obstetrical History: OB History    Gravida  1   Para      Term      Preterm      AB      Living        SAB      IAB      Ectopic      Multiple      Live Births              Social History Social History   Socioeconomic History  . Marital status: Single    Spouse name: Not on file  .  Number of children: Not on file  . Years of education: Not on file  . Highest education level: Not on file  Occupational History  . Not on file  Tobacco Use  . Smoking status: Never Smoker  . Smokeless tobacco: Never Used  Vaping Use  . Vaping Use: Never used  Substance and Sexual Activity  . Alcohol use: Not Currently  . Drug use: Not Currently  . Sexual activity: Yes  Other Topics Concern  . Not on file  Social History Narrative  . Not on file   Social Determinants of Health   Financial Resource Strain: Not on file  Food Insecurity: Not on file  Transportation Needs: Not on file  Physical Activity: Not on file  Stress: Not on file  Social Connections: Not on file    Family History: Family History  Problem Relation Age of Onset  . Healthy Mother   . Healthy Father   . Heart murmur Maternal Grandmother     Allergies: No Known Allergies  Medications Prior to Admission  Medication  Sig Dispense Refill Last Dose  . ferrous sulfate 325 (65 FE) MG EC tablet Take 1 tablet (325 mg total) by mouth every other day. 30 tablet 1   . loratadine (CLARITIN) 10 MG tablet Take 1 tablet (10 mg total) by mouth daily. (Patient not taking: Reported on 07/29/2020) 30 tablet 3   . Prenatal MV & Min w/FA-DHA (PRENATAL ADULT GUMMY/DHA/FA PO) Take by mouth.     . psyllium (METAMUCIL) 28 % packet Take 1 packet by mouth 2 (two) times daily. 60 packet 1     Review of Systems    All systems reviewed and negative except as stated in HPI  Blood pressure (!) 147/96, pulse 86, temperature 98.1 F (36.7 C), temperature source Oral, resp. rate 16, height 5\' 3"  (1.6 m), weight 109.3 kg, SpO2 98 %. General appearance: alert, cooperative and no distress Lungs: clear to auscultation bilaterally Heart: regular rate and rhythm Abdomen: soft, non-tender; bowel sounds normal Pelvic: n/a Extremities: Homans sign is negative, no sign of DVT DTR's +2 Presentation: cephalic Fetal monitoringBaseline:  120 bpm, Variability: Good {> 6 bpm), Accelerations: Reactive and Decelerations: Absent Uterine activity: occasional uc's     Prenatal labs: ABO, Rh: B/Positive/-- (08/31 1039) Antibody: Negative (08/31 1039) Rubella: 9.32 (08/31 1039) RPR: Non Reactive (12/09 1044)  HBsAg: Negative (08/31 1039)  HIV: Non Reactive (12/09 1044)  GBS: Negative/-- (01/24 1546)   Prenatal Transfer Tool  Maternal Diabetes: No Genetic Screening: Normal Maternal Ultrasounds/Referrals: Normal Fetal Ultrasounds or other Referrals:  None Maternal Substance Abuse:  No Significant Maternal Medications:  None Significant Maternal Lab Results: Group B Strep negative  No results found for this or any previous visit (from the past 24 hour(s)).  Patient Active Problem List   Diagnosis Date Noted  . Sickle cell trait (HCC) 07/16/2020  . Anemia of mother in pregnancy, antepartum, third trimester 07/16/2020  . Maternal varicella, non-immune 06/19/2020  . Supervision of normal pregnancy in third trimester 04/01/2020  . Obesity affecting pregnancy in third trimester 03/13/2020  . Allergic rhinitis 03/09/2012    Assessment/Plan:  Angela Johnston is a 20 y.o. G1P0 at [redacted]w[redacted]d here for IOL for new onset hypertension at term and decreased fetal movement  #Labor:cytotec and FB when able #Pain: Planning epidural #FWB: Cat 1 #ID:  GBS neg #MOF: bottle #MOC: unsure, considering pills #Circ:  N/a  Family Medicine MD called to notify of admission, no answer, HIPPA compliant message left.   [redacted]w[redacted]d, CNM  09/20/2020, 12:57 PM

## 2020-09-20 NOTE — Progress Notes (Signed)
CNM notified by RN of 2 elevated BPs greater than 160/110. CNM at bedside for reassessment. Patient reports worsening contractions but denies HA, visual changes or epigastric pain. Reviewed labs results and diagnosis of preeclampsia with severe features and recommended treatment. Patient and family verbalized understanding. Will start magnesium and patient plans to get epidural as soon as possible.   Rolm Bookbinder, CNM 09/20/20

## 2020-09-20 NOTE — Progress Notes (Signed)
Patient ID: Angela Johnston, female   DOB: [birth year], 19 y.o.   MRN: [id number]   Comfortable w epidural in place; s/p cytotec x 1 dose; mag sulfate infusing since dx of pre-e w SF this evening>asymptomatic  BPs 123/65, 131/84 FHR 120s, +accels, no decels Ctx irreg Cx 4/80/vtx -2; no membrane palpated, ?ROM time  IUP@40 .0wks Pre-e w SF> BPs stable now Latent labor  Start Pitocin and up-titrate to achieve active labor Anticipate vag del  [name] [name] 09/20/2020

## 2020-09-20 NOTE — Anesthesia Procedure Notes (Signed)
Epidural Patient location during procedure: OB Start time: 09/20/2020 6:26 PM End time: 09/20/2020 6:35 PM  Staffing Anesthesiologist: Lannie Fields, DO Performed: anesthesiologist   Preanesthetic Checklist Completed: patient identified, IV checked, risks and benefits discussed, monitors and equipment checked, pre-op evaluation and timeout performed  Epidural Patient position: sitting Prep: DuraPrep and site prepped and draped Patient monitoring: continuous pulse ox, blood pressure, heart rate and cardiac monitor Approach: midline Location: L3-L4 Injection technique: LOR air  Needle:  Needle type: Tuohy  Needle gauge: 17 G Needle length: 9 cm Needle insertion depth: 7 cm Catheter type: closed end flexible Catheter size: 19 Gauge Catheter at skin depth: 12 cm Test dose: negative  Assessment Sensory level: T8 Events: blood not aspirated, injection not painful, no injection resistance, no paresthesia and negative IV test  Additional Notes Patient identified. Risks/Benefits/Options discussed with patient including but not limited to bleeding, infection, nerve damage, paralysis, failed block, incomplete pain control, headache, blood pressure changes, nausea, vomiting, reactions to medication both or allergic, itching and postpartum back pain. Confirmed with bedside nurse the patient's most recent platelet count. Confirmed with patient that they are not currently taking any anticoagulation, have any bleeding history or any family history of bleeding disorders. Patient expressed understanding and wished to proceed. All questions were answered. Sterile technique was used throughout the entire procedure. Please see nursing notes for vital signs. Test dose was given through epidural catheter and negative prior to continuing to dose epidural or start infusion. Warning signs of high block given to the patient including shortness of breath, tingling/numbness in hands, complete motor  block, or any concerning symptoms with instructions to call for help. Patient was given instructions on fall risk and not to get out of bed. All questions and concerns addressed with instructions to call with any issues or inadequate analgesia.  Reason for block:procedure for pain

## 2020-09-21 ENCOUNTER — Encounter (HOSPITAL_COMMUNITY): Payer: Self-pay | Admitting: Obstetrics & Gynecology

## 2020-09-21 ENCOUNTER — Encounter (HOSPITAL_COMMUNITY)
Admission: AD | Disposition: A | Discharge status: Home / Self Care | Payer: Self-pay | Source: Home / Self Care | Attending: Obstetrics & Gynecology

## 2020-09-21 DIAGNOSIS — Z3A40 40 weeks gestation of pregnancy: Secondary | ICD-10-CM | POA: Diagnosis not present

## 2020-09-21 DIAGNOSIS — O368130 Decreased fetal movements, third trimester, not applicable or unspecified: Secondary | ICD-10-CM | POA: Diagnosis not present

## 2020-09-21 LAB — COMPREHENSIVE METABOLIC PANEL
ALT: 11 U/L (ref 0–44)
ALT: 11 U/L (ref 0–44)
AST: 27 U/L (ref 15–41)
AST: 23 U/L (ref 15–41)
Albumin: 2.8 g/dL — ABNORMAL LOW (ref 3.5–5.0)
Albumin: 2.5 g/dL — ABNORMAL LOW (ref 3.5–5.0)
Alkaline Phosphatase: 202 U/L — ABNORMAL HIGH (ref 38–126)
Alkaline Phosphatase: 192 U/L — ABNORMAL HIGH (ref 38–126)
Anion gap: 14 (ref 5–15)
Anion gap: 10 (ref 5–15)
BUN: 9 mg/dL (ref 6–20)
BUN: 9 mg/dL (ref 6–20)
CO2: 18 mmol/L — ABNORMAL LOW (ref 22–32)
CO2: 18 mmol/L — ABNORMAL LOW (ref 22–32)
Calcium: 8.2 mg/dL — ABNORMAL LOW (ref 8.9–10.3)
Calcium: 8.2 mg/dL — ABNORMAL LOW (ref 8.9–10.3)
Chloride: 100 mmol/L (ref 98–111)
Chloride: 104 mmol/L (ref 98–111)
Creatinine, Ser: 1.81 mg/dL — ABNORMAL HIGH (ref 0.44–1.00)
Creatinine, Ser: 1.82 mg/dL — ABNORMAL HIGH (ref 0.44–1.00)
GFR, Estimated: 41 mL/min — ABNORMAL LOW (ref >60)
GFR, Estimated: 40 mL/min — ABNORMAL LOW (ref >60)
Glucose, Bld: 124 mg/dL — ABNORMAL HIGH (ref 70–99)
Glucose, Bld: 146 mg/dL — ABNORMAL HIGH (ref 70–99)
Potassium: 4.2 mmol/L (ref 3.5–5.1)
Potassium: 4.6 mmol/L (ref 3.5–5.1)
Sodium: 132 mmol/L — ABNORMAL LOW (ref 135–145)
Sodium: 132 mmol/L — ABNORMAL LOW (ref 135–145)
Total Bilirubin: 0.7 mg/dL (ref 0.3–1.2)
Total Bilirubin: 0.4 mg/dL (ref 0.3–1.2)
Total Protein: 6.2 g/dL — ABNORMAL LOW (ref 6.5–8.1)
Total Protein: 5.6 g/dL — ABNORMAL LOW (ref 6.5–8.1)

## 2020-09-21 LAB — CBC
HCT: 32.5 % — ABNORMAL LOW (ref 36.0–46.0)
HCT: 30.6 % — ABNORMAL LOW (ref 36.0–46.0)
Hemoglobin: 10.1 g/dL — ABNORMAL LOW (ref 12.0–15.0)
Hemoglobin: 9.9 g/dL — ABNORMAL LOW (ref 12.0–15.0)
MCH: 24.2 pg — ABNORMAL LOW (ref 26.0–34.0)
MCH: 24.6 pg — ABNORMAL LOW (ref 26.0–34.0)
MCHC: 31.1 g/dL (ref 30.0–36.0)
MCHC: 32.4 g/dL (ref 30.0–36.0)
MCV: 77.8 fL — ABNORMAL LOW (ref 80.0–100.0)
MCV: 76.1 fL — ABNORMAL LOW (ref 80.0–100.0)
Platelets: 164 10*3/uL (ref 150–400)
Platelets: 163 10*3/uL (ref 150–400)
RBC: 4.18 MIL/uL (ref 3.87–5.11)
RBC: 4.02 MIL/uL (ref 3.87–5.11)
RDW: 17.5 % — ABNORMAL HIGH (ref 11.5–15.5)
RDW: 17.5 % — ABNORMAL HIGH (ref 11.5–15.5)
WBC: 16.2 10*3/uL — ABNORMAL HIGH (ref 4.0–10.5)
WBC: 18.8 10*3/uL — ABNORMAL HIGH (ref 4.0–10.5)
nRBC: 0 % (ref 0.0–0.2)
nRBC: 0 % (ref 0.0–0.2)

## 2020-09-21 LAB — MAGNESIUM
Magnesium: 8.7 mg/dL (ref 1.7–2.4)
Magnesium: 8.2 mg/dL (ref 1.7–2.4)
Magnesium: 6.8 mg/dL (ref 1.7–2.4)

## 2020-09-21 LAB — RPR: RPR Ser Ql: NONREACTIVE

## 2020-09-21 SURGERY — Surgical Case
Anesthesia: Epidural

## 2020-09-21 MED ORDER — ONDANSETRON HCL 4 MG/2ML IJ SOLN: 4.0000 mg | Freq: Three times a day (TID) | INTRAMUSCULAR | Status: DC | PRN

## 2020-09-21 MED ORDER — FENTANYL CITRATE (PF) 100 MCG/2ML IJ SOLN
INTRAMUSCULAR | Status: AC
  Filled 2020-09-21: qty 2

## 2020-09-21 MED ORDER — SIMETHICONE 80 MG PO CHEW
80.0000 mg | CHEWABLE_TABLET | ORAL | Status: DC | PRN
  Administered 2020-09-24: 80 mg via ORAL
  Filled 2020-09-21: qty 1

## 2020-09-21 MED ORDER — LACTATED RINGERS IV SOLN: INTRAVENOUS | Status: DC | PRN

## 2020-09-21 MED ORDER — ONDANSETRON HCL 4 MG/2ML IJ SOLN
INTRAMUSCULAR | Status: DC | PRN
  Administered 2020-09-21: 4 mg via INTRAVENOUS

## 2020-09-21 MED ORDER — FENTANYL CITRATE (PF) 100 MCG/2ML IJ SOLN
25.0000 ug | INTRAMUSCULAR | Status: DC | PRN
  Administered 2020-09-21 (×2): 50 ug via INTRAVENOUS

## 2020-09-21 MED ORDER — OXYTOCIN-SODIUM CHLORIDE 30-0.9 UT/500ML-% IV SOLN
INTRAVENOUS | Status: DC | PRN
  Administered 2020-09-21: 300 mL via INTRAVENOUS

## 2020-09-21 MED ORDER — PRENATAL MULTIVITAMIN CH
1.0000 | ORAL_TABLET | Freq: Every day | ORAL | Status: DC
  Filled 2020-09-21 (×2): qty 1

## 2020-09-21 MED ORDER — MENTHOL 3 MG MT LOZG: 1.0000 | LOZENGE | OROMUCOSAL | Status: DC | PRN

## 2020-09-21 MED ORDER — NALBUPHINE HCL 10 MG/ML IJ SOLN
5.0000 mg | INTRAMUSCULAR | Status: DC | PRN
  Administered 2020-09-22: 5 mg via INTRAVENOUS
  Filled 2020-09-21 (×2): qty 1

## 2020-09-21 MED ORDER — COCONUT OIL OIL: 1.0000 "application " | TOPICAL_OIL | Status: DC | PRN

## 2020-09-21 MED ORDER — ENOXAPARIN SODIUM 60 MG/0.6ML ~~LOC~~ SOLN
50.0000 mg | SUBCUTANEOUS | Status: DC
  Filled 2020-09-21: qty 0.6

## 2020-09-21 MED ORDER — CEFAZOLIN SODIUM-DEXTROSE 2-4 GM/100ML-% IV SOLN
INTRAVENOUS | Status: AC
  Filled 2020-09-21: qty 100

## 2020-09-21 MED ORDER — MAGNESIUM SULFATE 40 GM/1000ML IV SOLN
1.0000 g/h | INTRAVENOUS | Status: AC
  Administered 2020-09-22: 1 g/h via INTRAVENOUS
  Filled 2020-09-21: qty 1000

## 2020-09-21 MED ORDER — SENNOSIDES-DOCUSATE SODIUM 8.6-50 MG PO TABS
2.0000 | ORAL_TABLET | ORAL | Status: DC
  Administered 2020-09-21 – 2020-09-23 (×3): 2 via ORAL
  Filled 2020-09-21 (×3): qty 2

## 2020-09-21 MED ORDER — BUPIVACAINE HCL (PF) 0.5 % IJ SOLN
INTRAMUSCULAR | Status: AC
  Filled 2020-09-21: qty 30

## 2020-09-21 MED ORDER — MAGNESIUM SULFATE 40 GM/1000ML IV SOLN: 1.0000 g/h | INTRAVENOUS | Status: DC

## 2020-09-21 MED ORDER — SIMETHICONE 80 MG PO CHEW
80.0000 mg | CHEWABLE_TABLET | Freq: Three times a day (TID) | ORAL | Status: DC
  Administered 2020-09-21 – 2020-09-24 (×7): 80 mg via ORAL
  Filled 2020-09-21 (×7): qty 1

## 2020-09-21 MED ORDER — MORPHINE SULFATE (PF) 0.5 MG/ML IJ SOLN
INTRAMUSCULAR | Status: DC | PRN
  Administered 2020-09-21: 3 mg via EPIDURAL

## 2020-09-21 MED ORDER — OXYTOCIN-SODIUM CHLORIDE 30-0.9 UT/500ML-% IV SOLN: 2.5000 [IU]/h | INTRAVENOUS | Status: AC

## 2020-09-21 MED ORDER — LACTATED RINGERS IV SOLN: INTRAVENOUS | Status: DC

## 2020-09-21 MED ORDER — SODIUM CHLORIDE 0.9 % IR SOLN
Status: DC | PRN
  Administered 2020-09-21: 1

## 2020-09-21 MED ORDER — NALOXONE HCL 0.4 MG/ML IJ SOLN: 0.4000 mg | INTRAMUSCULAR | Status: DC | PRN

## 2020-09-21 MED ORDER — FENTANYL CITRATE (PF) 100 MCG/2ML IJ SOLN
50.0000 ug | INTRAMUSCULAR | Status: DC | PRN
Start: 2020-09-21 — End: 2020-09-24

## 2020-09-21 MED ORDER — MEASLES, MUMPS & RUBELLA VAC IJ SOLR
0.5000 mL | Freq: Once | INTRAMUSCULAR | Status: DC
  Filled 2020-09-21: qty 0.5

## 2020-09-21 MED ORDER — PHENYLEPHRINE 40 MCG/ML (10ML) SYRINGE FOR IV PUSH (FOR BLOOD PRESSURE SUPPORT)
PREFILLED_SYRINGE | INTRAVENOUS | Status: AC
  Filled 2020-09-21: qty 10

## 2020-09-21 MED ORDER — OXYTOCIN-SODIUM CHLORIDE 30-0.9 UT/500ML-% IV SOLN
INTRAVENOUS | Status: AC
  Filled 2020-09-21: qty 500

## 2020-09-21 MED ORDER — NALBUPHINE HCL 10 MG/ML IJ SOLN: 5.0000 mg | Freq: Once | INTRAMUSCULAR | Status: DC | PRN

## 2020-09-21 MED ORDER — DIBUCAINE (PERIANAL) 1 % EX OINT: 1.0000 "application " | TOPICAL_OINTMENT | CUTANEOUS | Status: DC | PRN

## 2020-09-21 MED ORDER — CEFAZOLIN SODIUM-DEXTROSE 2-4 GM/100ML-% IV SOLN
2.0000 g | Freq: Once | INTRAVENOUS | Status: AC
  Administered 2020-09-21: 2 g via INTRAVENOUS

## 2020-09-21 MED ORDER — DIPHENHYDRAMINE HCL 25 MG PO CAPS
25.0000 mg | ORAL_CAPSULE | Freq: Four times a day (QID) | ORAL | Status: DC | PRN
  Administered 2020-09-21: 25 mg via ORAL
  Filled 2020-09-21: qty 1

## 2020-09-21 MED ORDER — ACETAMINOPHEN 500 MG PO TABS: 1000.0000 mg | ORAL_TABLET | Freq: Four times a day (QID) | ORAL | Status: DC | PRN

## 2020-09-21 MED ORDER — SODIUM CHLORIDE 0.9 % IV SOLN
500.0000 mg | Freq: Once | INTRAVENOUS | Status: AC
  Administered 2020-09-21: 500 mg via INTRAVENOUS

## 2020-09-21 MED ORDER — ONDANSETRON HCL 4 MG/2ML IJ SOLN
INTRAMUSCULAR | Status: AC
  Filled 2020-09-21: qty 2

## 2020-09-21 MED ORDER — DIPHENHYDRAMINE HCL 25 MG PO CAPS: 25.0000 mg | ORAL_CAPSULE | ORAL | Status: DC | PRN

## 2020-09-21 MED ORDER — STERILE WATER FOR IRRIGATION IR SOLN
Status: DC | PRN
  Administered 2020-09-21: 1

## 2020-09-21 MED ORDER — NALOXONE HCL 4 MG/10ML IJ SOLN
1.0000 ug/kg/h | INTRAVENOUS | Status: DC | PRN
  Filled 2020-09-21: qty 5

## 2020-09-21 MED ORDER — LIDOCAINE-EPINEPHRINE (PF) 2 %-1:200000 IJ SOLN
INTRAMUSCULAR | Status: DC | PRN
  Administered 2020-09-21: 10 mL via EPIDURAL
  Administered 2020-09-21: 3 mL via EPIDURAL

## 2020-09-21 MED ORDER — SODIUM CHLORIDE 0.9% FLUSH: 3.0000 mL | INTRAVENOUS | Status: DC | PRN

## 2020-09-21 MED ORDER — TETANUS-DIPHTH-ACELL PERTUSSIS 5-2.5-18.5 LF-MCG/0.5 IM SUSY: 0.5000 mL | PREFILLED_SYRINGE | Freq: Once | INTRAMUSCULAR | Status: DC

## 2020-09-21 MED ORDER — ACETAMINOPHEN 500 MG PO TABS
1000.0000 mg | ORAL_TABLET | Freq: Four times a day (QID) | ORAL | Status: AC
  Administered 2020-09-22: 500 mg via ORAL
  Administered 2020-09-22 (×2): 1000 mg via ORAL
  Filled 2020-09-21 (×4): qty 2

## 2020-09-21 MED ORDER — WITCH HAZEL-GLYCERIN EX PADS: 1.0000 "application " | MEDICATED_PAD | CUTANEOUS | Status: DC | PRN

## 2020-09-21 MED ORDER — ACETAMINOPHEN 10 MG/ML IV SOLN: 1000.0000 mg | Freq: Once | INTRAVENOUS | Status: DC | PRN

## 2020-09-21 MED ORDER — KETOROLAC TROMETHAMINE 30 MG/ML IJ SOLN: 30.0000 mg | Freq: Four times a day (QID) | INTRAMUSCULAR | Status: DC | PRN

## 2020-09-21 MED ORDER — ACETAMINOPHEN 500 MG PO TABS
1000.0000 mg | ORAL_TABLET | Freq: Four times a day (QID) | ORAL | Status: DC | PRN
  Administered 2020-09-22 – 2020-09-23 (×4): 1000 mg via ORAL
  Filled 2020-09-21 (×5): qty 2

## 2020-09-21 MED ORDER — TRANEXAMIC ACID-NACL 1000-0.7 MG/100ML-% IV SOLN
INTRAVENOUS | Status: DC | PRN
  Administered 2020-09-21: 1000 mg via INTRAVENOUS

## 2020-09-21 MED ORDER — SODIUM CHLORIDE 0.9 % IV SOLN
INTRAVENOUS | Status: AC
  Filled 2020-09-21: qty 500

## 2020-09-21 MED ORDER — PHENYLEPHRINE HCL (PRESSORS) 10 MG/ML IV SOLN
INTRAVENOUS | Status: DC | PRN
  Administered 2020-09-21 (×7): 80 ug via INTRAVENOUS

## 2020-09-21 MED ORDER — OXYCODONE HCL 5 MG PO TABS
5.0000 mg | ORAL_TABLET | ORAL | Status: DC | PRN
  Administered 2020-09-22: 10 mg via ORAL
  Administered 2020-09-22: 5 mg via ORAL
  Administered 2020-09-23 (×3): 10 mg via ORAL
  Administered 2020-09-24 (×2): 5 mg via ORAL
  Filled 2020-09-21: qty 2
  Filled 2020-09-21: qty 1
  Filled 2020-09-21: qty 2
  Filled 2020-09-21 (×2): qty 1
  Filled 2020-09-21 (×2): qty 2

## 2020-09-21 MED ORDER — NALBUPHINE HCL 10 MG/ML IJ SOLN: 5.0000 mg | INTRAMUSCULAR | Status: DC | PRN

## 2020-09-21 MED ORDER — SCOPOLAMINE 1 MG/3DAYS TD PT72: 1.0000 | MEDICATED_PATCH | Freq: Once | TRANSDERMAL | Status: DC

## 2020-09-21 MED ORDER — DIPHENHYDRAMINE HCL 50 MG/ML IJ SOLN
12.5000 mg | INTRAMUSCULAR | Status: DC | PRN
  Administered 2020-09-22: 12.5 mg via INTRAVENOUS
  Filled 2020-09-21: qty 1

## 2020-09-21 MED ORDER — MORPHINE SULFATE (PF) 0.5 MG/ML IJ SOLN
INTRAMUSCULAR | Status: AC
  Filled 2020-09-21: qty 10

## 2020-09-21 SURGICAL SUPPLY — 35 items
BARRIER ADHS 3X4 INTERCEED (GAUZE/BANDAGES/DRESSINGS) IMPLANT
BRR ADH 4X3 ABS CNTRL BYND (GAUZE/BANDAGES/DRESSINGS)
CHLORAPREP W/TINT 26ML (MISCELLANEOUS) ×2 IMPLANT
CLAMP CORD UMBIL (MISCELLANEOUS) IMPLANT
CLIP FILSHIE TUBAL LIGA STRL (Clip) IMPLANT
CLOTH BEACON ORANGE TIMEOUT ST (SAFETY) ×2 IMPLANT
DRSG OPSITE POSTOP 4X10 (GAUZE/BANDAGES/DRESSINGS) ×2 IMPLANT
DRSG PAD ABDOMINAL 8X10 ST (GAUZE/BANDAGES/DRESSINGS) ×1 IMPLANT
ELECT REM PT RETURN 9FT ADLT (ELECTROSURGICAL) ×2
ELECTRODE REM PT RTRN 9FT ADLT (ELECTROSURGICAL) ×1 IMPLANT
EXTRACTOR VACUUM KIWI (MISCELLANEOUS) IMPLANT
GAUZE SPONGE 4X4 12PLY STRL (GAUZE/BANDAGES/DRESSINGS) ×2 IMPLANT
GLOVE BIO SURGEON STRL SZ 6.5 (GLOVE) ×2 IMPLANT
GLOVE BIOGEL PI IND STRL 7.0 (GLOVE) ×2 IMPLANT
GLOVE BIOGEL PI INDICATOR 7.0 (GLOVE) ×2
GOWN STRL REUS W/TWL LRG LVL3 (GOWN DISPOSABLE) ×4 IMPLANT
HOVERMATT SINGLE USE (MISCELLANEOUS) ×1 IMPLANT
KIT ABG SYR 3ML LUER SLIP (SYRINGE) IMPLANT
NDL HYPO 25X5/8 SAFETYGLIDE (NEEDLE) IMPLANT
NEEDLE HYPO 22GX1.5 SAFETY (NEEDLE) IMPLANT
NEEDLE HYPO 25X5/8 SAFETYGLIDE (NEEDLE) IMPLANT
NS IRRIG 1000ML POUR BTL (IV SOLUTION) ×2 IMPLANT
PACK C SECTION WH (CUSTOM PROCEDURE TRAY) ×2 IMPLANT
PAD OB MATERNITY 4.3X12.25 (PERSONAL CARE ITEMS) ×2 IMPLANT
PENCIL SMOKE EVAC W/HOLSTER (ELECTROSURGICAL) ×2 IMPLANT
RETRACTOR WND ALEXIS 25 LRG (MISCELLANEOUS) IMPLANT
RTRCTR WOUND ALEXIS 25CM LRG (MISCELLANEOUS)
SUT VIC AB 0 CT1 36 (SUTURE) ×12 IMPLANT
SUT VIC AB 2-0 CT1 27 (SUTURE) ×2
SUT VIC AB 2-0 CT1 TAPERPNT 27 (SUTURE) ×1 IMPLANT
SUT VIC AB 4-0 PS2 27 (SUTURE) ×2 IMPLANT
SYR CONTROL 10ML LL (SYRINGE) IMPLANT
TOWEL OR 17X24 6PK STRL BLUE (TOWEL DISPOSABLE) ×2 IMPLANT
TRAY FOLEY W/BAG SLVR 14FR LF (SET/KITS/TRAYS/PACK) IMPLANT
WATER STERILE IRR 1000ML POUR (IV SOLUTION) ×2 IMPLANT

## 2020-09-21 NOTE — Lactation Note (Signed)
This note was copied from a baby's chart. Lactation Consultation Note  Patient Name: Angela Johnston IOMBT'D Date: 09/21/2020 Reason for consult: Initial assessment;Term;Other (Comment) (Infant in [place] currently has possible Cephalhematoma.) Age:21 hours LC entered the room, infant was not present, infant was  in [address] at this time. LC assisted with hand expression but only smear or few drops of colostrum present. Mom feeding choice is breast and formula feeding, LC gave mom supplemental feeding guidelines for formula after breastfeeding infant based on infant's age/ hours of life. Mom was using the DEBP as LC was leaving the room, mom knows to pump every 3 hours for 15 minutes on initial setting. Mom knows once infant is in room, to BF infant according to hunger cues, 8 to 12+ or more times within 24 hours, STS. Mom knows to call RN or LC if she needs assistance with latching infant at the breast. LC discussed infant's input and output. Mom made aware of O/P services, breastfeeding support groups, community resources, and our phone # for post-discharge questions.  Mom's plan: 1- BF infant first according to feeding cues, STS. 2-Afterwards supplement infant with formula each feeding, mom's choice. 3- Pump every 3 hours for 15 minutes on initial setting, giving infant her EBM first that is pumped before formula.  Maternal Data    Feeding Mother's Current Feeding Choice: Breast Milk and Formula  LATCH Score                    Lactation Tools Discussed/Used Tools: Pump Breast pump type: Double-Electric Breast Pump Pump Education: Setup, frequency, and cleaning;Milk Storage Reason for Pumping: Infant is currently seperated from mom in [place], mom with C/S delivery and hx of GHTN on Mag. Pumping frequency: Mom understands to pump every 3 hours for 15 minutes on inital setting.  Interventions Interventions: Breast feeding basics reviewed;Skin to  skin;Hand express;Expressed milk;DEBP  Discharge Pump: DEBP WIC Program: Yes  Consult Status Consult Status: Follow-up Date: 09/22/20 Follow-up type: In-patient    Angela Johnston 09/21/2020, 7:08 PM

## 2020-09-21 NOTE — Progress Notes (Signed)
Patient ID: Angela Johnston, female   DOB: 2001-05-05, 19 y.o.   MRN: [id number]  Feeling a little stronger rectal pressure but not unbearable; comfortable between ctx; doing well on mag infusion  BP 126/66, P 90 FHR 115-120, +accels, early variables w some ctx Ctx 1-3 with some coupling with Pit at 67mu/min Cx 9/90/vtx 0 per RN exam at 0613  IUP@40 .1wks Pre-e w SF Active labor/transition  -Continue with position changes and keep ctx reg -Pt not feeling a real strong pressure, so she is open to waiting for the pressure to increase before checking her cx again -Anticipate vag del  [name] CNM 09/21/2020

## 2020-09-21 NOTE — Progress Notes (Signed)
Patient ID: Angela Johnston, female   DOB: 2000/12/21, 19 y.o.   MRN: [id number]  Sitting up in high Fowlers, doing well; feeling slight pressure  BP 112/63, 134/73 FHR 120s, +accels, occ variables Ctx q 1-3 mins, some coupling, Pit at 81mu/min Cx was 7+/90/vtx -1 per RN exam @ 0113  IUP@40 .1wks Pre-e w SF Active labor  Continue keeping ctx reg Plan cx exams q 2-3 hrs or sooner with urge to push Anticipate vag del  [name] CNM 09/21/2020 2:05 AM

## 2020-09-21 NOTE — Discharge Instructions (Signed)
-take tylenol 1000 mg every 6 hours as needed for pain, alternate with ibuprofen 600 mg every 6 hours -take oxycodone as needed if tylenol and ibuprofen aren't working -drink plenty of water to help with breastfeeding -continue prenatal vitamins while you are breastfeeding -take iron pills every other day with vitamin c, this will help healing as well as breast feeding -think about birth control options-->bedisider.org is a great website! You can get any form of birth control from the health department for free   Postpartum Care After Cesarean Delivery This sheet gives you information about how to care for yourself from the time you deliver your baby to up to 6-12 weeks after delivery (postpartum period). Your health care provider may also give you more specific instructions. If you have problems or questions, contact your health care provider. Follow these instructions at home: Medicines  Take over-the-counter and prescription medicines only as told by your health care provider.  If you were prescribed an antibiotic medicine, take it as told by your health care provider. Do not stop taking the antibiotic even if you start to feel better.  Ask your health care provider if the medicine prescribed to you: ? Requires you to avoid driving or using heavy machinery. ? Can cause constipation. You may need to take actions to prevent or treat constipation, such as:  Drink enough fluid to keep your urine pale yellow.  Take over-the-counter or prescription medicines.  Eat foods that are high in fiber, such as beans, whole grains, and fresh fruits and vegetables.  Limit foods that are high in fat and processed sugars, such as fried or sweet foods. Activity  Gradually return to your normal activities as told by your health care provider.  Avoid activities that take a lot of effort and energy (are strenuous) until approved by your health care provider. Walking at a slow to moderate pace is usually  safe. Ask your health care provider what activities are safe for you. ? Do not lift anything that is heavier than your baby or 10 lb (4.5 kg) as told by your health care provider. ? Do not vacuum, climb stairs, or drive a car for as long as told by your health care provider.  If possible, have someone help you at home until you are able to do your usual activities yourself.  Rest as much as possible. Try to rest or take naps while your baby is sleeping. Vaginal bleeding  It is normal to have vaginal bleeding (lochia) after delivery. Wear a sanitary pad to absorb vaginal bleeding and discharge. ? During the first week after delivery, the amount and appearance of lochia is often similar to a menstrual period. ? Over the next few weeks, it will gradually decrease to a dry, yellow-brown discharge. ? For most women, lochia stops completely by 4-6 weeks after delivery. Vaginal bleeding can vary from woman to woman.  Change your sanitary pads frequently. Watch for any changes in your flow, such as: ? A sudden increase in volume. ? A change in color. ? Large blood clots.  If you pass a blood clot, save it and call your health care provider to discuss. Do not flush blood clots down the toilet before you get instructions from your health care provider.  Do not use tampons or douches until your health care provider says this is safe.  If you are not breastfeeding, your period should return 6-8 weeks after delivery. If you are breastfeeding, your period may return anytime between 8   weeks after delivery and the time that you stop breastfeeding. Perineal care  If your C-section (Cesarean section) was unplanned, and you were allowed to labor and push before delivery, you may have pain, swelling, and discomfort of the tissue between your vaginal opening and your anus (perineum). You may also have an incision in the tissue (episiotomy) or the tissue may have torn during delivery. Follow these instructions  as told by your health care provider: ? Keep your perineum clean and dry as told by your health care provider. Use medicated pads and pain-relieving sprays and creams as directed. ? If you have an episiotomy or vaginal tear, check the area every day for signs of infection. Check for:  Redness, swelling, or pain.  Fluid or blood.  Warmth.  Pus or a bad smell. ? You may be given a squirt bottle to use instead of wiping to clean the perineum area after you go to the bathroom. As you start healing, you may use the squirt bottle before wiping yourself. Make sure to wipe gently. ? To relieve pain caused by an episiotomy, vaginal tear, or hemorrhoids, try taking a warm sitz bath 2-3 times a day. A sitz bath is a warm water bath that is taken while you are sitting down. The water should only come up to your hips and should cover your buttocks.   Breast care  Within the first few days after delivery, your breasts may feel heavy, full, and uncomfortable (breast engorgement). You may also have milk leaking from your breasts. Your health care provider can suggest ways to help relieve breast discomfort. Breast engorgement should go away within a few days.  If you are breastfeeding: ? Wear a bra that supports your breasts and fits you well. ? Keep your nipples clean and dry. Apply creams and ointments as told by your health care provider. ? You may need to use breast pads to absorb milk leakage. ? You may have uterine contractions every time you breastfeed for several weeks after delivery. Uterine contractions help your uterus return to its normal size. ? If you have any problems with breastfeeding, work with your health care provider or a lactation consultant.  If you are not breastfeeding: ? Avoid touching your breasts as this can make your breasts produce more milk. ? Wear a well-fitting bra and use cold packs to help with swelling. ? Do not squeeze out (express) milk. This causes you to make more  milk. Intimacy and sexuality  Ask your health care provider when you can engage in sexual activity. This may depend on your: ? Risk of infection. ? Healing rate. ? Comfort and desire to engage in sexual activity.  You are able to get pregnant after delivery, even if you have not had your period. If desired, talk with your health care provider about methods of family planning or birth control (contraception). Lifestyle  Do not use any products that contain nicotine or tobacco, such as cigarettes, e-cigarettes, and chewing tobacco. If you need help quitting, ask your health care provider.  Do not drink alcohol, especially if you are breastfeeding. Eating and drinking  Drink enough fluid to keep your urine pale yellow.  Eat high-fiber foods every day. These may help prevent or relieve constipation. High-fiber foods include: ? Whole grain cereals and breads. ? Brown rice. ? Beans. ? Fresh fruits and vegetables.  Take your prenatal vitamins until your postpartum checkup or until your health care provider tells you it is okay to stop.     General instructions  Keep all follow-up visits for you and your baby as told by your health care provider. Most women visit their health care provider for a postpartum checkup within the first 3-6 weeks after delivery. Contact a health care provider if you:  Feel unable to cope with the changes that a new baby brings to your life, and these feelings do not go away.  Feel unusually sad or worried.  Have breasts that are painful, hard, or turn red.  Have a fever.  Have trouble holding urine or keeping urine from leaking.  Have little or no interest in activities you used to enjoy.  Have not breastfed at all and you have not had a menstrual period for 12 weeks after delivery.  Have stopped breastfeeding and you have not had a menstrual period for 12 weeks after you stopped breastfeeding.  Have questions about caring for yourself or your  baby.  Pass a blood clot from your vagina. Get help right away if you:  Have chest pain.  Have difficulty breathing.  Have sudden, severe leg pain.  Have severe pain or cramping in your abdomen.  Bleed from your vagina so much that you fill more than one sanitary pad in one hour. Bleeding should not be heavier than your heaviest period.  Develop a severe headache.  Faint.  Have blurred vision or spots in your vision.  Have a bad-smelling vaginal discharge.  Have thoughts about hurting yourself or your baby. If you ever feel like you may hurt yourself or others, or have thoughts about taking your own life, get help right away. You can go to your nearest emergency department or call:  Your local emergency services (911 in the [place]  A suicide crisis helpline, such as the National Suicide Prevention Lifeline at 1-800-273-8255. This is open 24 hours a day. Summary  The period of time from when you deliver your baby to up to 6-12 weeks after delivery is called the postpartum period.  Gradually return to your normal activities as told by your health care provider.  Keep all follow-up visits for you and your baby as told by your health care provider. This information is not intended to replace advice given to you by your health care provider. Make sure you discuss any questions you have with your health care provider. Document Revised: 03/08/2018 Document Reviewed: 03/08/2018 Elsevier Patient Education  2021 [employer]  

## 2020-09-21 NOTE — Progress Notes (Signed)
Labor Progress Note Angela Johnston is a 20 y.o. G1P0 at [redacted]w[redacted]d presented for IOL for preeclampsia with severe features  S:  Patient tired, pushing x1 hour  O:  BP 132/74   Pulse 85   Temp 98.1 F (36.7 C) (Oral)   Resp 18   Ht 5\' 4"  (1.626 m)   Wt 108.9 kg   LMP  (LMP Unknown)   SpO2 98%   BMI 41.20 kg/m   Fetal Tracing:  Baseline: 120 Variability: moderate Accels: 15x15 Decels: early/variable  Toco: 1-3   CVE: Dilation: 10 Dilation Complete Date: 09/21/20 Dilation Complete Time: 0750 Effacement (%): 100 Cervical Position: Posterior Station: Plus 1 Presentation: Vertex Exam by:: [email or web address], RN   A&P: 20 y.o. G1P0 [redacted]w[redacted]d IOL preeclampsia with severe features #Labor: labored down for 1.5hrs and now pushing for 1 hour. Will continue pushing. Repeat preeclampsia labs and mag level ordered #Pain: epidural #FWB: Cat 1 #GBS negative  [redacted]w[redacted]d, CNM 11:14 AM

## 2020-09-21 NOTE — Op Note (Signed)
Angela Johnston PROCEDURE DATE: 09/21/2020  PREOPERATIVE DIAGNOSES: Intrauterine pregnancy at [redacted]w[redacted]d weeks gestation; failure to progress: arrest of descent and failed vacuum assisted vaginal delivery  POSTOPERATIVE DIAGNOSES: The same  PROCEDURE: Primary Low Transverse Cesarean Section  SURGEON:  Dr. Scheryl Darter  ASSISTANT:  Dr. Mart Piggs  INDICATIONS: Angela Johnston is a 20 y.o. G1P1001 at [redacted]w[redacted]d here for cesarean section secondary to the indications listed under preoperative diagnoses; please see preoperative note for further details.  The risks of surgery were discussed with the patient including but were not limited to: bleeding which may require transfusion or reoperation; infection which may require antibiotics; injury to bowel, bladder, ureters or other surrounding organs; injury to the fetus; need for additional procedures including hysterectomy in the event of a life-threatening hemorrhage; placental abnormalities wth subsequent pregnancies, incisional problems, thromboembolic phenomenon and other postoperative/anesthesia complications.The patient concurred with the proposed plan, giving informed written consent for the procedure.    FINDINGS:  Viable female infant in cephalic presentation.  Apgars 6 and 9.  Light meconium stained amniotic fluid.  Intact placenta, manually extracted, three vessel cord.  Normal uterus, fallopian tubes and ovaries bilaterally.  ANESTHESIA: Epidural INTRAVENOUS FLUIDS: 800 ml ESTIMATED BLOOD LOSS: 572 ml URINE OUTPUT:  150 ml SPECIMENS: Placenta sent to pathology COMPLICATIONS: None immediate  PROCEDURE IN DETAIL:  The patient preoperatively received intravenous antibiotics and had sequential compression devices applied to her lower extremities.   She was then taken to the operating room where the epidural anesthesia was dosed up to surgical level and was found to be adequate. She was then placed in a dorsal supine position with a leftward tilt, and  prepped and draped in a sterile manner.  A foley catheter was in place prior to case start.  After an adequate timeout was performed, a Pfannenstiel skin incision was made with scalpel and carried through to the underlying layer of fascia. The fascia was incised in the midline, and this incision was extended bilaterally bluntly.  Kocher clamps were applied to the superior aspect of the fascial incision and the underlying rectus muscles were dissected off bluntly and sharply. A similar process was carried out on the inferior aspect of the fascial incision. The rectus muscles were separated in the midline bluntly and the peritoneum was entered bluntly. A bladder flap was made with Metzenbaum scissors. Attention was turned to the lower uterine segment where a low transverse hysterotomy was made with a scalpel and extended bilaterally bluntly.  The infant was successfully delivered, the cord was clamped and cut immediately, and the infant was handed over to awaiting neonatology team. Uterine massage was then administered, and the placenta was manually extracted after umbilical cord avulsion, intact, with a three-vessel cord, sent to pathology. The uterus was then cleared of clot and debris.  The hysterotomy was closed with 0 Vicryl in a running locked fashion, and an imbricating layer was also placed with 0 Vicryl.  Figure-of-eight 0 Vicryl serosal stitches were placed to help with hemostasis. Hemostasis was confirmed on all surfaces.  The peritoneum was reapproximated using 2-0 Vicryl running stitch. The fascia was then closed using 0 Vicryl in a running fashion.  The subcutaneous layer was irrigated, then reapproximated with 2-0 plain gut interrupted stitches.  The skin was closed with a 4-0 Vicryl subcuticular stitch. The patient tolerated the procedure well. Sponge, lap, instrument and needle counts were correct x 3.  She was taken to the recovery room in stable condition.   Mart Piggs  C,  MD 09/21/2020 3:47 PM

## 2020-09-21 NOTE — Progress Notes (Signed)
Angela Johnston is a 20 y.o. G1P0 at [redacted]w[redacted]d by ultrasound admitted for induction of labor due to Pre-eclamptic toxemia of pregnancy..  Subjective:   Objective: BP (!) 142/91   Pulse 97   Temp 98.3 F (36.8 C) (Oral)   Resp 16   Ht 5\' 4"  (1.626 m)   Wt 108.9 kg   LMP  (LMP Unknown)   SpO2 99%   BMI 41.20 kg/m  I/O last 3 completed shifts: In: 747.4 [I.V.:747.4] Out: 1930 [Urine:1930] Total I/O In: 2187.3 [P.O.:540; I.V.:1647.3] Out: 75 [Urine:75]  FHT:  FHR: 115 bpm, variability: moderate,  accelerations:  Present,  decelerations:  Present variable UC:   irregular, every 3-5 minutes SVE:   Dilation: 10 Effacement (%): 100 Station: Plus 1 Exam by:: [email or web address], RN Kiwi vacuum consented verbally, risks explained. One pop-off, descent to +3 with arrest diagnosed, failed vacuum. She consented to proceed with CS The risks of surgery were discussed with the patient including but were not limited to: bleeding which may require transfusion or reoperation; infection which may require antibiotics; injury to bowel, bladder, ureters or other surrounding organs; injury to the fetus; need for additional procedures including hysterectomy in the event of a life-threatening hemorrhage; formation of adhesions; placental abnormalities wth subsequent pregnancies; incisional problems; thromboembolic phenomenon and other postoperative/anesthesia complications.  The patient concurred with the proposed plan, giving informed written consent for the procedure.  Anesthesia and OR aware. Preoperative prophylactic antibiotics and SCDs ordered on call to the OR.  To OR when ready.   Labs: Lab Results  Component Value Date   WBC 16.2 (H) 09/21/2020   HGB 10.1 (L) 09/21/2020   HCT 32.5 (L) 09/21/2020   MCV 77.8 (L) 09/21/2020   PLT 164 09/21/2020    Assessment / Plan: Arrest of decent  Labor: failed vacuum Preeclampsia:  on magnesium sulfate Fetal Wellbeing:  Category II Pain Control:  Epidural I/D:   prophylactic antibiotic ordered Anticipated MOD:  CS  09/23/2020 09/21/2020, 2:12 PM

## 2020-09-21 NOTE — Discharge Summary (Signed)
Postpartum Discharge Summary  Date of Service updated 09/24/20     Patient Name: Angela Johnston DOB: June 25, 2001 MRN: [id number]  Date of admission: 09/20/2020 Delivery date:09/21/2020  Delivering provider: Woodroe Mode  Date of discharge: 09/24/2020  Admitting diagnosis: Hypertension affecting pregnancy [O16.9] Intrauterine pregnancy: [redacted]w[redacted]d    Secondary diagnosis:  Active Problems:   Obesity affecting pregnancy in third trimester   Maternal varicella, non-immune   Sickle cell trait ([place]   Anemia of mother in pregnancy, antepartum, third trimester   Severe preeclampsia   Cesarean delivery delivered  Additional problems: postpartum depressed mood    Discharge diagnosis: Preeclampsia (severe)                                              Post partum procedures:n/a Augmentation: Pitocin and Cytotec Complications: None  Hospital course: Induction of Labor With Cesarean Section   20y.o. yo G1P1001 at [id number] admitted to the hospital 09/20/2020 for induction of labor. Patient had a labor course significant for IOL with cytotec and later pitocin. Patient progressed to complete and pushed x4 hours with minimal fetal descent. A Kiwi vacuum was then attempted which did provide some fetal descent but unable to delivery vaginally so decision was made to proceed with cesarean section. The patient went for cesarean section due to Arrest of Descent and failed vacuum assisted delivery. Delivery details are as follows: Membrane Rupture Time/Date: 2:40 PM ,09/21/2020   Delivery Method:C-Section, Low Transverse  Details of operation can be found in separate operative Note.  Patient had an uncomplicated postpartum course. She is ambulating, tolerating a regular diet, passing flatus, and urinating well.  Patient is discharged home in stable condition on 09/24/20.      Newborn Data: Birth date:09/21/2020  Birth time:2:41 PM  Gender:Female  Living status:Living  Apgars:6 ,9  Weight:3240 g                                  Magnesium Sulfate received: Yes: Seizure prophylaxis BMZ received: No Rhophylac:N/A MMR:N/A T-DaP:Given prenatally Flu: No Transfusion:No  Physical exam  Vitals:   09/23/20 1806 09/23/20 2018 09/24/20 0034 09/24/20 0346  BP: 139/80 (!) 153/78 (!) 156/90 132/67  Pulse: 88 (!) 102 (!) 122 (!) 118  Resp:  18 20 (!) 22  Temp:  98.2 F (36.8 C) 98.4 F (36.9 C) 98.3 F (36.8 C)  TempSrc:  Oral Oral Oral  SpO2:  100% 100% 100%  Weight:      Height:       General: alert, cooperative and no distress Lochia: appropriate Uterine Fundus: firm Incision: Dressing is clean, dry, and intact DVT Evaluation: No evidence of DVT seen on physical exam. Negative Homan's sign. Labs: Lab Results  Component Value Date   WBC 19.2 (H) 09/22/2020   HGB 8.0 (L) 09/22/2020   HCT 23.7 (L) 09/22/2020   MCV 75.2 (L) 09/22/2020   PLT 154 09/22/2020   CMP Latest Ref Rng & Units 09/24/2020  Glucose 70 - 99 mg/dL 99  BUN 6 - 20 mg/dL 6  Creatinine 0.44 - 1.00 mg/dL 0.99  Sodium 135 - 145 mmol/L 141  Potassium 3.5 - 5.1 mmol/L 4.3  Chloride 98 - 111 mmol/L 106  CO2 22 - 32 mmol/L 25  Calcium 8.9 -  10.3 mg/dL 9.1  Total Protein 6.5 - 8.1 g/dL 6.6  Total Bilirubin 0.3 - 1.2 mg/dL 0.5  Alkaline Phos 38 - 126 U/L 182(H)  AST 15 - 41 U/L 24  ALT 0 - 44 U/L 14   Edinburgh Score: Edinburgh Postnatal Depression Scale Screening Tool 09/22/2020  I have been able to laugh and see the funny side of things. 0  I have looked forward with enjoyment to things. 0  I have blamed myself unnecessarily when things went wrong. 3  I have been anxious or worried for no good reason. 3  I have felt scared or panicky for no good reason. 3  Things have been getting on top of me. 2  I have been so unhappy that I have had difficulty sleeping. 2  I have felt sad or miserable. 2  I have been so unhappy that I have been crying. 3  The thought of harming myself has occurred to me. 2  Edinburgh  Postnatal Depression Scale Total 20     After visit meds:  Allergies as of 09/24/2020   No Known Allergies     Medication List    TAKE these medications   enalapril 5 MG tablet Commonly known as: VASOTEC Take 1 tablet (5 mg total) by mouth daily. Start taking on: September 25, 2020   ferrous sulfate 325 (65 FE) MG tablet Take 325 mg by mouth every other day.   Metamucil 28 % packet Generic drug: psyllium Take 1 packet by mouth 2 (two) times daily.   NIFEdipine 60 MG 24 hr tablet Commonly known as: ADALAT CC Take 1 tablet (60 mg total) by mouth daily. Start taking on: September 25, 2020   oxyCODONE 5 MG immediate release tablet Commonly known as: Oxy IR/ROXICODONE Take 1-2 tablets (5-10 mg total) by mouth every 4 (four) hours as needed for moderate pain (May add APAP with OxyIR for moderate pain).   prenatal multivitamin Tabs tablet Take 1 tablet by mouth daily at 12 noon.        Discharge home in stable condition Infant Feeding: Bottle and Breast Infant Disposition:home with mother Discharge instruction: per After Visit Summary and Postpartum booklet. Activity: Advance as tolerated. Pelvic rest for 6 weeks.  Diet: routine diet Future Appointments: Future Appointments  Date Time Provider [place]  09/30/2020  2:30 PM Alphia Moh [facility]  10/27/2020  2:30 PM Richarda Osmond, DO [facility] [place]   Follow up Visit:  [facility]. Schedule an appointment as soon as possible for a visit in 1 week(s).   Specialty: Obstetrics and Gynecology Why: BP check, pt also needs an outpatient behavorial  health visit for high depression score Contact information: [place] [id number] [phone]             Appointments scheduled by Dr. Rock Nephew on 09/21/20.   Please schedule this patient for a In person postpartum visit in 6 weeks with  the following provider: Any provider. Additional Postpartum F/U:Incision check 1 week and BP check 1 week  High risk pregnancy complicated by: HTN Delivery mode:  C-Section, Low Transverse  Anticipated Birth Control:  Nexplanon   09/24/2020 Griffin Basil, MD

## 2020-09-21 NOTE — Progress Notes (Signed)
Notified Dr. Debroah Loop of mag level of 8.2. Received verbal order to d/c mag and obtain magnesium level at 2230 tonight. Will continue to monitor.

## 2020-09-21 NOTE — Transfer of Care (Signed)
Immediate Anesthesia Transfer of Care Note  Patient: Angela Johnston  Procedure(s) Performed: CESAREAN SECTION (N/A )  Patient Location: PACU  Anesthesia Type:Epidural  Level of Consciousness: awake  Airway & Oxygen Therapy: Patient Spontanous Breathing  Post-op Assessment: Report given to RN  Post vital signs: Reviewed  Last Vitals:  Vitals Value Taken Time  BP 129/64 09/21/20 1535  Temp 36.8 C 09/21/20 1531  Pulse 83 09/21/20 1539  Resp 28 09/21/20 1539  SpO2 100 % 09/21/20 1539  Vitals shown include unvalidated device data.  Last Pain:  Vitals:   09/21/20 1531  TempSrc: Oral  PainSc:       Patients Stated Pain Goal: 0 (09/20/20 1248)  Complications: No complications documented.

## 2020-09-21 NOTE — Anesthesia Postprocedure Evaluation (Signed)
Anesthesia Post Note  Patient: Angela Johnston  Procedure(s) Performed: CESAREAN SECTION (N/A )     Patient location during evaluation: PACU Anesthesia Type: Epidural Level of consciousness: oriented and awake and alert Pain management: pain level controlled Vital Signs Assessment: post-procedure vital signs reviewed and stable Respiratory status: spontaneous breathing, respiratory function stable and patient connected to nasal cannula oxygen Cardiovascular status: blood pressure returned to baseline and stable Postop Assessment: no headache, no backache and no apparent nausea or vomiting Anesthetic complications: no   No complications documented.  Last Vitals:  Vitals:   09/21/20 1845 09/21/20 1943  BP: (!) 149/87 129/77  Pulse: 95 97  Resp: 20 19  Temp: 36.5 C 36.6 C  SpO2: 100% 99%    Last Pain:  Vitals:   09/21/20 1950  TempSrc:   PainSc: 0-No pain   Pain Goal: Patients Stated Pain Goal: 6 (09/21/20 1845)                 Romie Jumper L Azyiah Bo

## 2020-09-21 NOTE — Progress Notes (Signed)
Date and time results received: 09/21/20 2322   Test: Magnesium Level Critical Value: 6.8  Name of Provider Notified: Dr. Despina Hidden  Orders Received? Or Actions Taken?: Order received to restart Magnesium Sulfate infusion at 1g/hr.

## 2020-09-22 ENCOUNTER — Encounter (HOSPITAL_COMMUNITY): Payer: Self-pay | Admitting: Obstetrics & Gynecology

## 2020-09-22 DIAGNOSIS — Z8759 Personal history of other complications of pregnancy, childbirth and the puerperium: Secondary | ICD-10-CM

## 2020-09-22 DIAGNOSIS — Z98891 History of uterine scar from previous surgery: Secondary | ICD-10-CM

## 2020-09-22 LAB — COMPREHENSIVE METABOLIC PANEL
ALT: 10 U/L (ref 0–44)
AST: 24 U/L (ref 15–41)
Albumin: 2.2 g/dL — ABNORMAL LOW (ref 3.5–5.0)
Alkaline Phosphatase: 160 U/L — ABNORMAL HIGH (ref 38–126)
Anion gap: 8 (ref 5–15)
BUN: 9 mg/dL (ref 6–20)
CO2: 22 mmol/L (ref 22–32)
Calcium: 8.1 mg/dL — ABNORMAL LOW (ref 8.9–10.3)
Chloride: 105 mmol/L (ref 98–111)
Creatinine, Ser: 1.75 mg/dL — ABNORMAL HIGH (ref 0.44–1.00)
GFR, Estimated: 42 mL/min — ABNORMAL LOW (ref >60)
Glucose, Bld: 119 mg/dL — ABNORMAL HIGH (ref 70–99)
Potassium: 3.9 mmol/L (ref 3.5–5.1)
Sodium: 135 mmol/L (ref 135–145)
Total Bilirubin: 0.7 mg/dL (ref 0.3–1.2)
Total Protein: 5.1 g/dL — ABNORMAL LOW (ref 6.5–8.1)

## 2020-09-22 LAB — CBC
HCT: 23.7 % — ABNORMAL LOW (ref 36.0–46.0)
Hemoglobin: 8 g/dL — ABNORMAL LOW (ref 12.0–15.0)
MCH: 25.4 pg — ABNORMAL LOW (ref 26.0–34.0)
MCHC: 33.8 g/dL (ref 30.0–36.0)
MCV: 75.2 fL — ABNORMAL LOW (ref 80.0–100.0)
Platelets: 154 10*3/uL (ref 150–400)
RBC: 3.15 MIL/uL — ABNORMAL LOW (ref 3.87–5.11)
RDW: 17.8 % — ABNORMAL HIGH (ref 11.5–15.5)
WBC: 19.2 10*3/uL — ABNORMAL HIGH (ref 4.0–10.5)
nRBC: 0 % (ref 0.0–0.2)

## 2020-09-22 MED ORDER — HYDROXYZINE HCL 10 MG PO TABS
10.0000 mg | ORAL_TABLET | Freq: Three times a day (TID) | ORAL | Status: DC | PRN
  Administered 2020-09-22: 10 mg via ORAL
  Filled 2020-09-22 (×4): qty 1

## 2020-09-22 MED ORDER — NIFEDIPINE ER OSMOTIC RELEASE 30 MG PO TB24
30.0000 mg | ORAL_TABLET | Freq: Every day | ORAL | Status: DC
  Administered 2020-09-22: 30 mg via ORAL
  Filled 2020-09-22: qty 1

## 2020-09-22 MED ORDER — SODIUM CHLORIDE 0.9 % IV SOLN
500.0000 mg | Freq: Once | INTRAVENOUS | Status: AC
  Administered 2020-09-22: 500 mg via INTRAVENOUS
  Filled 2020-09-22 (×2): qty 25

## 2020-09-22 NOTE — Lactation Note (Signed)
This note was copied from a baby's chart. Lactation Consultation Note  Patient Name: Angela Johnston [id number] Date: 09/22/2020   Age:21 hours   Per RN, mother desires to formula feed only.   Maternal Data    Feeding Nipple Type: Slow - flow  LATCH Score                    Lactation Tools Discussed/Used    Interventions    Discharge    Consult Status      [name] R Lewis Keats 09/22/2020, 3:17 PM

## 2020-09-22 NOTE — Progress Notes (Signed)
POSTPARTUM PROGRESS NOTE  POD #1  Subjective:  Angela Johnston is a 20 y.o. G1P1001 s/p primary LTCS at [redacted]w[redacted]d.  She reports she doing well. No acute events overnight. She denies any problems with ambulating, voiding or po intake. Denies nausea or vomiting. She has not passed flatus. Pain is well controlled.  Lochia is minimal.  Objective: Blood pressure (!) 159/85, pulse (!) 102, temperature 98.4 F (36.9 C), temperature source Oral, resp. rate 18, height 5\' 4"  (1.626 m), weight 108.9 kg, SpO2 100 %, unknown if currently breastfeeding.  Physical Exam:  General: alert, cooperative and no distress Chest: no respiratory distress Heart:regular rate, distal pulses intact Abdomen: soft, nontender, mildly distended,  Uterine Fundus: firm, appropriately tender DVT Evaluation: No calf swelling or tenderness Extremities: minimal edema Skin: warm, dry; incision clean/dry/intact w/ honeycomb dressing in place. Pressure bandage in place  Recent Labs    09/21/20 1632 09/22/20 0437  HGB 9.9* 8.0*  HCT 30.6* 23.7*    Assessment/Plan: Angela Johnston is a 20 y.o. G1P1001 s/p primary cesarean section at [redacted]w[redacted]d for arrest of descent and failed vacuum.  POD#1 -  Pt to finish magnesium today. Iron infusion for mild anemia Long discussion with patient  Regarding report of wanting to hurt self, pt denies current suicidal ideation or plan.  She denies any stimulus to wanting to hurt self.  She notes she has a lot to live for with new baby.  Agree with social work coming to see patient though. Add procardia for BP control, follow BP closely.   LOS: 2 days   [redacted]w[redacted]d, Md Faculty Attending, [facility] 09/22/2020, 10:26 AM

## 2020-09-23 DIAGNOSIS — O1415 Severe pre-eclampsia, complicating the puerperium: Secondary | ICD-10-CM

## 2020-09-23 DIAGNOSIS — D649 Anemia, unspecified: Secondary | ICD-10-CM

## 2020-09-23 DIAGNOSIS — O9903 Anemia complicating the puerperium: Secondary | ICD-10-CM

## 2020-09-23 LAB — COMPREHENSIVE METABOLIC PANEL
ALT: 11 U/L (ref 0–44)
AST: 21 U/L (ref 15–41)
Albumin: 2.4 g/dL — ABNORMAL LOW (ref 3.5–5.0)
Alkaline Phosphatase: 151 U/L — ABNORMAL HIGH (ref 38–126)
Anion gap: 8 (ref 5–15)
BUN: 6 mg/dL (ref 6–20)
CO2: 25 mmol/L (ref 22–32)
Calcium: 8.5 mg/dL — ABNORMAL LOW (ref 8.9–10.3)
Chloride: 107 mmol/L (ref 98–111)
Creatinine, Ser: 1.08 mg/dL — ABNORMAL HIGH (ref 0.44–1.00)
GFR, Estimated: >60 mL/min (ref >60)
Glucose, Bld: 99 mg/dL (ref 70–99)
Potassium: 3.9 mmol/L (ref 3.5–5.1)
Sodium: 140 mmol/L (ref 135–145)
Total Bilirubin: 0.4 mg/dL (ref 0.3–1.2)
Total Protein: 5.8 g/dL — ABNORMAL LOW (ref 6.5–8.1)

## 2020-09-23 LAB — SURGICAL PATHOLOGY

## 2020-09-23 MED ORDER — ENALAPRIL MALEATE 5 MG PO TABS
5.0000 mg | ORAL_TABLET | Freq: Every day | ORAL | Status: DC
  Administered 2020-09-23 – 2020-09-24 (×2): 5 mg via ORAL
  Filled 2020-09-23 (×2): qty 1

## 2020-09-23 MED ORDER — BISACODYL 10 MG RE SUPP
10.0000 mg | Freq: Every day | RECTAL | Status: DC | PRN
  Administered 2020-09-23: 10 mg via RECTAL
  Filled 2020-09-23: qty 1

## 2020-09-23 MED ORDER — NIFEDIPINE ER OSMOTIC RELEASE 30 MG PO TB24
60.0000 mg | ORAL_TABLET | Freq: Every day | ORAL | Status: DC
  Administered 2020-09-23 – 2020-09-24 (×2): 60 mg via ORAL
  Filled 2020-09-23 (×2): qty 2

## 2020-09-23 NOTE — Clinical Social Work Maternal (Signed)
CLINICAL SOCIAL WORK MATERNAL/CHILD NOTE  Patient Details  Name: Angela Johnston MRN: [id number] Date of Birth: Oct 31, 2000  Date:  09/23/2020  Clinical Social Worker Initiating Note:  Abundio Miu, [place] Date/Time: Initiated:  09/23/20/1500     Child's Name:  Angela Johnston   Biological Parents:  Mother,Father (Father: Angela Johnston)   Need for Interpreter:  None   Reason for Referral:  Behavioral Health Concerns   Address:  [address] [place] [address]    Phone number:  [phone]  Additional phone number:   Household Members/Support Persons (HM/SP):   Household Member/Support Person 1,Household Member/Support Person 2,Household Member/Support Person 3,Household Member/Support Person 4   HM/SP Name Relationship DOB or Age  HM/SP -69   mom    HM/SP -2   sibling    HM/SP -3   sibling    HM/SP -4   sibling    HM/SP -42        HM/SP -56        HM/SP -7        HM/SP -8          Natural Supports (not living in the home):      Professional Supports: None   Employment: Unemployed   Type of Work:     Education:  Programmer, systems   Homebound arranged:    Museum/gallery curator Resources:  Medicaid   Other Resources:  [facility]   Cultural/Religious Considerations Which May Impact Care:    Strengths:  Ability to meet basic needs ,Pediatrician chosen,Home prepared for child    Psychotropic Medications:         Pediatrician:    [place]  Pediatrician List:   [place]  [place]      Pediatrician Fax Number:    Risk Factors/Current Problems:  Mental Health Concerns    Cognitive State:  Able to Concentrate ,Alert ,Insightful ,Goal Oriented ,Linear Thinking    Mood/Affect:  Calm ,Interested ,Comfortable    CSW Assessment: CSW met with MOB at bedside and introduced self, FOB present. CSW asked FOB to leave the room to speak  with MOB privately, FOB left the room. CSW explained to MOB the reason for consult. MOB was welcoming, open and remained engaged during assessment. MOB reported that she resides with her mother and 3 siblings. MOB reported that she receives [facility] and has all items needed to care for infant including car seat and crib. CSW inquired about MOB's support system, MOB reported that her mom is a support.   CSW inquired about MOB's mental health history. MOB denied any mental health history. CSW inquired about how MOB was feeling emotionally after giving birth, MOB shook her head and shared that yesterday she was "pouring down crying" and unable to explain to anyone what she was feeling. CSW and MOB discussed baby blues and normalized being tearful. MOB reported that she is feeling okay today. CSW and MOB discussed edinburgh score 20 and answering yes to question 10 thoughts of self harm. MOB reported that she has not had any thoughts of self harm recently. MOB reported that the last time she had thoughts of self harm was during the beginning of pregnancy. MOB attributed the thoughts of self harm to being overwhelmed with pregnancy and relationship issues. CSW and MOB discussed MOB's relationship issues. MOB shared concerns about FOB's involvement in  parenting infant. CSW actively listened and provided encouraging words. CSW encouraged MOB to discuss her concerns with FOB to see how they can parent together. CSW encouraged MOB to consider making plans to use other supports as needed. CSW inquired about MOB's interest in parenting education supports in the community for additional support, MOB declined. CSW emphasized that MOB's edinburgh score was high and she may be more susceptible to postpartum depression and asked if MOB was interested in therapy resources. MOB verbalized understanding and declined resources. MOB presented calm and did not demonstrate any acute mental health signs/symptoms. CSW assessed for safety, MOB  denied SI, HI and domestic violence.   CSW provided education regarding the baby blues period vs. perinatal mood disorders, discussed treatment and gave resources for mental health follow up if concerns arise.  CSW recommends self-evaluation during the postpartum time period using the New Mom Checklist from Postpartum Progress and encouraged MOB to contact a medical professional if symptoms are noted at any time.    CSW provided review of Sudden Infant Death Syndrome (SIDS) precautions.    CSW identifies no further need for intervention and no barriers to discharge at this time.   CSW Plan/Description:  No Further Intervention Required/No Barriers to Discharge,Sudden Infant Death Syndrome (SIDS) Education,Perinatal Mood and Anxiety Disorder (PMADs) Education    Burnis Medin, LCSW 09/23/2020, 3:12 PM

## 2020-09-23 NOTE — Progress Notes (Signed)
POSTPARTUM PROGRESS NOTE  POD #2  Subjective:  Angela Johnston is a 20 y.o. G1P1001 s/p primary LTCS at [redacted]w[redacted]d.  She reports she doing well. No acute events overnight. She denies any problems with ambulating, voiding or po intake. Denies nausea or vomiting. She has passed flatus, but has not had a bowel movement. Pain is well controlled.  Lochia is minimal.  Objective: Blood pressure 138/87, pulse (!) 110, temperature 98.9 F (37.2 C), temperature source Oral, resp. rate 18, height 5\' 4"  (1.626 m), weight 108.9 kg, SpO2 99 %, unknown if currently breastfeeding.  Physical Exam:  General: alert, cooperative and no distress Chest: no respiratory distress Heart:regular rate, distal pulses intact Abdomen: soft, nontender, obese, mildly distended, hypoactive bowel sounds, mild tympany, but improved from previous Uterine Fundus: firm, appropriately tender DVT Evaluation: No calf swelling or tenderness Extremities: trace edema Skin: warm, dry; incision clean/dry/intact w/ honeycomb dressing in place  Recent Labs    09/21/20 1632 09/22/20 0437  HGB 9.9* 8.0*  HCT 30.6* 23.7*    Assessment/Plan: Angela Johnston is a 20 y.o. G1P1001 s/p primary LTCS at [redacted]w[redacted]d for arrest of descent and failed vacuum.  POD#2 -  Contraception: pt considered OCP, but due to BP now considering nexplanon, pt prefers outpatient placement Feeding: Bottle Magnesium sulfate is off.  Pt is s/p venofer Procardia XL increased to 60 mg daily, will monitor BP today Creatinine is improving, recheck in AM Anticipate D/C on 2/23    LOS: 3 days   3/23, Md Faculty Attending, [name] 09/23/2020, 10:17 AM

## 2020-09-23 NOTE — Progress Notes (Signed)
Subjective: Postpartum Day 2: Cesarean Delivery Patient reports incisional pain, tolerating PO, + flatus and no problems voiding.    Objective: Vital signs in last 24 hours: Temp:  [98.1 F (36.7 C)-99 F (37.2 C)] 98.4 F (36.9 C) (02/22 0557) Pulse Rate:  [87-102] 96 (02/22 0557) Resp:  [17-20] 18 (02/22 0557) BP: (144-159)/(71-87) 156/87 (02/22 0557) SpO2:  [98 %-100 %] 99 % (02/22 0557)  Physical Exam:  General: alert, cooperative and no distress Lochia: appropriate Abdomen is a bit distended soft Uterine Fundus: firm Incision: healing well DVT Evaluation: No evidence of DVT seen on physical exam.  Recent Labs    09/21/20 1632 09/22/20 0437  HGB 9.9* 8.0*  HCT 30.6* 23.7*    CMP Latest Ref Rng & Units 09/23/2020 09/22/2020 09/21/2020  Glucose 70 - 99 mg/dL 99 073(X) 106(Y)  BUN 6 - 20 mg/dL 6 9 9   Creatinine 0.44 - 1.00 mg/dL ) 6.94(W) 5.46(E)  Sodium 135 - 145 mmol/L 140 135 132(L)  Potassium 3.5 - 5.1 mmol/L 3.9 3.9 4.6  Chloride 98 - 111 mmol/L 107 105 104  CO2 22 - 32 mmol/L 25 22 18(L)  Calcium 8.9 - 10.3 mg/dL 7.03(J) 8.1(L) 8.2(L)  Total Protein 6.5 - 8.1 g/dL 0.0(X) 5.1(L) 5.6(L)  Total Bilirubin 0.3 - 1.2 mg/dL 0.4 0.7 0.4  Alkaline Phos 38 - 126 U/L 151(H) 160(H) 192(H)  AST 15 - 41 U/L 21 24 23   ALT 0 - 44 U/L 11 10 11     Assessment/Plan: Status post Cesarean section. Doing well postoperatively.  Continue current care, increased procardia xl to 60 Ordered dulcolax suppository Creatinine significantly improved.  If BP improves anticipate discharge tomorrow  3.8(H 09/23/2020, 7:56 AM

## 2020-09-24 ENCOUNTER — Encounter: Payer: Medicaid Other | Admitting: Family Medicine

## 2020-09-24 ENCOUNTER — Other Ambulatory Visit: Payer: Self-pay | Admitting: Obstetrics and Gynecology

## 2020-09-24 ENCOUNTER — Ambulatory Visit: Payer: Medicaid Other

## 2020-09-24 ENCOUNTER — Other Ambulatory Visit: Payer: Self-pay | Admitting: Lactation Services

## 2020-09-24 DIAGNOSIS — Z1331 Encounter for screening for depression: Secondary | ICD-10-CM

## 2020-09-24 LAB — COMPREHENSIVE METABOLIC PANEL
ALT: 14 U/L (ref 0–44)
AST: 24 U/L (ref 15–41)
Albumin: 2.8 g/dL — ABNORMAL LOW (ref 3.5–5.0)
Alkaline Phosphatase: 182 U/L — ABNORMAL HIGH (ref 38–126)
Anion gap: 10 (ref 5–15)
BUN: 6 mg/dL (ref 6–20)
CO2: 25 mmol/L (ref 22–32)
Calcium: 9.1 mg/dL (ref 8.9–10.3)
Chloride: 106 mmol/L (ref 98–111)
Creatinine, Ser: 0.99 mg/dL (ref 0.44–1.00)
GFR, Estimated: >60 mL/min (ref >60)
Glucose, Bld: 99 mg/dL (ref 70–99)
Potassium: 4.3 mmol/L (ref 3.5–5.1)
Sodium: 141 mmol/L (ref 135–145)
Total Bilirubin: 0.5 mg/dL (ref 0.3–1.2)
Total Protein: 6.6 g/dL (ref 6.5–8.1)

## 2020-09-24 MED ORDER — ENALAPRIL MALEATE 5 MG PO TABS: 5.0000 mg | ORAL_TABLET | Freq: Every day | ORAL | 2 refills | Status: DC

## 2020-09-24 MED ORDER — OXYCODONE HCL 5 MG PO TABS: 5.0000 mg | ORAL_TABLET | ORAL | 0 refills | Status: DC | PRN

## 2020-09-24 MED ORDER — OXYCODONE HCL 5 MG PO TABS
5.0000 mg | ORAL_TABLET | ORAL | Status: DC | PRN
  Administered 2020-09-24: 10 mg via ORAL
  Filled 2020-09-24: qty 2

## 2020-09-24 MED ORDER — NIFEDIPINE ER 60 MG PO TB24: 60.0000 mg | ORAL_TABLET | Freq: Every day | ORAL | 1 refills | Status: DC

## 2020-09-24 MED FILL — oxyCODONE HCL 5 MG TABS: 5 | 4 days supply | Qty: 30 | Fill #0

## 2020-09-24 MED FILL — ENALAPRIL MALEATE 5 MG TABS: 5 | 30 days supply | Qty: 30 | Fill #0

## 2020-09-24 MED FILL — NIFEdipine ER 60 MG TB24: 60 | 30 days supply | Qty: 30 | Fill #0

## 2020-09-24 NOTE — Progress Notes (Signed)
Discharge instructions given to patient. Reviewed PPD, HTN, follow up appointments, postpartum care, and  medications.

## 2020-09-24 NOTE — Progress Notes (Signed)
POSTPARTUM PROGRESS NOTE  POD #3  Subjective:  Angela Johnston is a 20 y.o. G1P1001 s/p primary LTCS at [redacted]w[redacted]d.  She reports she doing well. No acute events overnight.  She denies any problems with ambulating, voiding or po intake. Denies nausea or vomiting. She has  passed flatus. Pain is well controlled.  Lochia is minimal.  Objective: Blood pressure 132/67, pulse (!) 118, temperature 98.3 F (36.8 C), temperature source Oral, resp. rate (!) 22, height 5\' 4"  (1.626 m), weight 108.9 kg, SpO2 100 %, unknown if currently breastfeeding.  Physical Exam:  General: alert, cooperative and no distress Chest: no respiratory distress Heart:mild tachycardia, distal pulses intact Abdomen: soft, nontender, positive bowel sounds, no tympany Uterine Fundus: firm, appropriately tender DVT Evaluation: No calf swelling or tenderness Extremities: 1+ edema Skin: warm, dry; incision clean/dry/intact w/ honeycomb dressing in place  Recent Labs    09/21/20 1632 09/22/20 0437  HGB 9.9* 8.0*  HCT 30.6* 23.7*    Assessment/Plan: AYUMI WANGERIN is a 20 y.o. G1P1001 s/p  Primary LTCS at [redacted]w[redacted]d for failure to progress, failed vacuum.  POD#3 -  Contraception: interval nexplanon BP improved, d/c home today with close follow up  LOS: 4 days   [redacted]w[redacted]d, Md Faculty Attending, [facility] for [facility] 09/24/2020, 11:07 AM

## 2020-09-25 ENCOUNTER — Other Ambulatory Visit (HOSPITAL_COMMUNITY): Payer: Medicaid Other

## 2020-09-25 ENCOUNTER — Telehealth: Payer: Self-pay

## 2020-09-25 NOTE — Telephone Encounter (Signed)
Transition Care Management Follow-up Telephone Call  Date of discharge and from where: 09/24/2020 from [facility]  How have you been since you were released from the hospital? Pt states that she is doing well.   Any questions or concerns? Yes  Items Reviewed:  Did the pt receive and understand the discharge instructions provided? No   Medications obtained and verified? Yes   Other? No   Any new allergies since your discharge? No   Dietary orders reviewed? n/a  Do you have support at home? Yes   Functional Questionnaire: (I = Independent and D = Dependent) ADLs: I  Bathing/Dressing- I  Meal Prep- I  Eating- I  Maintaining continence- I  Transferring/Ambulation- I  Managing Meds- I   Follow up appointments reviewed:   PCP Hospital f/u appt confirmed? Yes  Scheduled to see Jamelle Rushing, DO on 09/30/2020 @ 2:30pm.  Specialist Hospital f/u appt confirmed? No  .  Are transportation arrangements needed? No    If their condition worsens, is the pt aware to call PCP or go to the Emergency Dept.? Yes  Was the patient provided with contact information for the PCP's office or ED? Yes  Was to pt encouraged to call back with questions or concerns? Yes

## 2020-09-27 ENCOUNTER — Inpatient Hospital Stay (HOSPITAL_COMMUNITY): Payer: Medicaid Other

## 2020-09-27 ENCOUNTER — Inpatient Hospital Stay (HOSPITAL_COMMUNITY)
Admission: AD | Admit: 2020-09-27 | Payer: Medicaid Other | Source: Home / Self Care | Admitting: Obstetrics and Gynecology

## 2020-09-30 ENCOUNTER — Ambulatory Visit (INDEPENDENT_AMBULATORY_CARE_PROVIDER_SITE_OTHER): Payer: Medicaid Other | Admitting: Student in an Organized Health Care Education/Training Program

## 2020-09-30 ENCOUNTER — Other Ambulatory Visit: Payer: Self-pay

## 2020-09-30 ENCOUNTER — Encounter: Payer: Self-pay | Admitting: Student in an Organized Health Care Education/Training Program

## 2020-09-30 DIAGNOSIS — Z8679 Personal history of other diseases of the circulatory system: Secondary | ICD-10-CM | POA: Insufficient documentation

## 2020-09-30 DIAGNOSIS — O09899 Supervision of other high risk pregnancies, unspecified trimester: Secondary | ICD-10-CM | POA: Diagnosis not present

## 2020-09-30 DIAGNOSIS — Z283 Underimmunization status: Secondary | ICD-10-CM

## 2020-09-30 DIAGNOSIS — Z8759 Personal history of other complications of pregnancy, childbirth and the puerperium: Secondary | ICD-10-CM | POA: Diagnosis not present

## 2020-09-30 DIAGNOSIS — Z23 Encounter for immunization: Secondary | ICD-10-CM

## 2020-09-30 HISTORY — DX: Personal history of other diseases of the circulatory system: Z86.79

## 2020-09-30 HISTORY — DX: Personal history of other diseases of the circulatory system: Z87.59

## 2020-09-30 MED ORDER — ACETAMINOPHEN ER 650 MG PO TBCR: 650.0000 mg | EXTENDED_RELEASE_TABLET | Freq: Three times a day (TID) | ORAL | 1 refills | Status: DC | PRN

## 2020-09-30 NOTE — Assessment & Plan Note (Signed)
Varicella vaccine administered today.  Patient tolerated well

## 2020-09-30 NOTE — BH Specialist Note (Signed)
Pt did not arrive to video visit and did not answer the phone; Left HIPPA-compliant message to call back Daelin Haste from [facility] at  [phone] (Eleny Cortez's office).  ?; left MyChart message for patient.  ? ?

## 2020-09-30 NOTE — Assessment & Plan Note (Signed)
Patient was adherent with nifedipine post discharge without negative side effects. Blood pressure 106/62 today and asymptomatic. -Discontinue antihypertensive -Blood pressure recheck in 2 weeks -Provided patient with guidance verbally and written for symptoms of hypertension that would warrant prompt reevaluation

## 2020-09-30 NOTE — Patient Instructions (Signed)
It was a pleasure to see you today!  To summarize our discussion for this visit:  Your incision looks Really Good! You can shower regularly but do not take any baths or swimming that the incision would be submerged in water for at least another month.   You are getting your "chicken pox" vaccine today.   Please think about birth control options and we can start something at your next visit.   We can stop your blood pressure medication today and will recheck in 2 weeks. Let me know if you experience any symptoms of high blood pressure such as visual changes with headache, leg swelling, chest pain, shortness of breath.  We can also check in on how you are doing with mood or let me know if you need anything sooner.  Some additional health maintenance measures we should update are: Health Maintenance Due  Topic Date Due  . COVID-19 Vaccine (1) Never done  .   Please return to our clinic to see me march 15th.  Call the clinic at [phone] if your symptoms worsen or you have any concerns.   Thank you for allowing me to take part in your care,  Dr. Jamelle Rushing   Postpartum Hypertension Postpartum hypertension is high blood pressure that is higher than normal after childbirth. It usually starts within 1 to 2 days after delivery, but it can happen at any time for up to 6 weeks after delivery. For some women, medical treatment is required to prevent serious complications, such as seizures or stroke. What are the causes? The cause of this condition is not well understood. In some cases, the cause may not be known. Certain conditions may increase your risk. These include:  Hypertension that existed before pregnancy (chronic hypertension).  Hypertension that comes as a result of pregnancy (gestational hypertension).  Hypertensive disorders during pregnancy or seizures in women who have high blood pressure during pregnancy. These conditions are called preeclampsia and eclampsia.  A  condition in which the liver, platelets, and red blood cells are damaged during pregnancy (HELLP syndrome).  Obesity.  Diabetes. What are the signs or symptoms? As with all types of hypertension, postpartum hypertension may not have any symptoms. Depending on how high your blood pressure is, you may experience:  Headaches. These may be mild, moderate, or severe. They may also be steady, constant, or sudden in onset (thunderclap headache).  Vision changes, such as blurry vision, flashing lights, or seeing spots.  Nausea and vomiting.  Pain in the upper right side of your abdomen.  Shortness of breath.  Difficulty breathing while lying down.  A decrease in the amount of urine that you pass. How is this diagnosed? This condition may be diagnosed based on the results of a physical exam, blood pressure measurements, and blood and urine tests. You may also have other tests, such as a CT scan or an MRI, to check for other problems of postpartum hypertension. How is this treated? If blood pressure is high enough to require treatment, your options may include:  Medicines to reduce blood pressure (antihypertensives). Tell your health care provider if you are breastfeeding or if you plan to breastfeed. There are many antihypertensive medicines that are safe to take while breastfeeding.  Treating medical conditions that are causing hypertension.  Treating the complications of hypertension, such as seizures, stroke, or kidney problems. Your health care provider will also continue to monitor your blood pressure closely until it is within a safe range for you. Follow these  instructions at home: Learn your goal blood pressure Two numbers make up your blood pressure. The first number is called systolic pressure. The second is called diastolic pressure. An example of a blood pressure reading is "120 over 80" (or 120/80). For most people, goal blood pressure is:  First number: below 140.  Second  number: below 90. Your blood pressure is above normal even if only the top or bottom number is above normal. Know what to do before you take your blood pressure 30 minutes before you check your blood pressure:  Do not drink caffeine.  Do not drink alcohol.  Avoid food and drink.  Do not smoke.  Do not exercise. 5 minutes before you check your blood pressure:  Use the bathroom and urinate so that you have an empty bladder.  Sit quietly in a dining room chair. Do not sit in a soft couch or an armchair. Do not talk. Know how to take your blood pressure To check your blood pressure, follow the instructions in the manual that came with your blood pressure monitor. If you have a digital blood pressure monitor, the instructions may be as follows: 1. Sit up straight. 2. Place your feet on the floor. Do not cross your ankles or legs. 3. Rest your left arm at the level of your heart. You may rest it on a table, desk, or chair. 4. Pull up your shirt sleeve. 5. Wrap the blood pressure cuff around the upper part of your left arm. The cuff should be 1 inch (2.5 cm) above your elbow. It is best to wrap the cuff around bare skin. 6. Fit the cuff snugly around your arm. You should be able to place only one finger between the cuff and your arm. 7. Put the cord inside the groove of your elbow. 8. Press the power button. 9. Sit quietly while the cuff fills with air and loses air. 10. Write down the numbers on the screen. These are your blood pressure readings. 11. Wait 1-2 minutes and then repeat steps 1-10.   Record your blood pressure readings Follow your health care provider's instructions on how to record your blood pressure readings. If you were asked to use this form, follow these instructions:  Get one reading in the morning (a.m.) before you take any medicines.  Get one reading in the evening (p.m.) before supper.  Take at least 2 readings with each blood pressure check. This makes sure  the results are correct. Wait 1-2 minutes between measurements.  Write down the results in the spaces on this form. Date: _______________________  a.m. _____________________(1st reading) _____________________(2nd reading)  p.m. _____________________(1st reading) _____________________(2nd reading) Date: _______________________  a.m. _____________________(1st reading) _____________________(2nd reading)  p.m. _____________________(1st reading) _____________________(2nd reading) Date: _______________________  a.m. _____________________(1st reading) _____________________(2nd reading)  p.m. _____________________(1st reading) _____________________(2nd reading) Date: _______________________  a.m. _____________________(1st reading) _____________________(2nd reading)  p.m. _____________________(1st reading) _____________________(2nd reading) Date: _______________________  a.m. _____________________(1st reading) _____________________(2nd reading)  p.m. _____________________(1st reading) _____________________(2nd reading) General instructions  Take over-the-counter and prescription medicines only as told by your health care provider.  Do not use any products that contain nicotine or tobacco. These products include cigarettes, chewing tobacco, and vaping devices, such as e-cigarettes. If you need help quitting, ask your health care provider.  Check your blood pressure as often as recommended by your health care provider.  Return to your normal activities as told by your health care provider. Ask your health care provider what activities are safe for you.  Keep  all follow-up visits. This is important. Contact a health care provider if:  You have new symptoms, such as: ? A headache that does not get better. ? Dizziness. ? Visual changes. ? Nausea and vomiting. Get help right away if:  You develop difficulty breathing.  You have chest pain.  You faint.  You have any symptoms of  a stroke. "BE FAST" is an easy way to remember the main warning signs of a stroke: ? B - Balance. Signs are dizziness, sudden trouble walking, or loss of balance. ? E - Eyes. Signs are trouble seeing or a sudden change in vision. ? F - Face. Signs are sudden weakness or numbness of the face, or the face or eyelid drooping on one side. ? A - Arms. Signs are weakness or numbness in an arm. This happens suddenly and usually on one side of the body. ? S - Speech. Signs are sudden trouble speaking, slurred speech, or trouble understanding what people say. ? T - Time. Time to call emergency services. Write down what time symptoms started.  You have other signs of a stroke, such as: ? A sudden, severe headache with no known cause. ? Nausea or vomiting. ? Seizure. These symptoms may represent a serious problem that is an emergency. Do not wait to see if the symptoms will go away. Get medical help right away. Call your local emergency services (911 in the [place] Do not drive yourself to the hospital. Summary  Postpartum hypertension is high blood pressure that remains higher than normal after childbirth.  For some women, medical treatment is required to prevent serious complications, such as seizures or stroke.  Follow your health care provider's instructions on how to record your blood pressure readings.  Keep all follow-up visits. This is important. This information is not intended to replace advice given to you by your health care provider. Make sure you discuss any questions you have with your health care provider. Document Revised: 04/15/2020 Document Reviewed: 04/15/2020 Elsevier Patient Education  2021 [employer].

## 2020-09-30 NOTE — Progress Notes (Signed)
    SUBJECTIVE:   Postpartum visit:  patient is ~1 week postpartum following a c/s delivery after failure to progress and vacuum assistance. Patient had hypertension during delivery requiring magnesium, antihypertensive. -Breastfeeding: No.  Endorses formula feeding going well. -Contraception: Still undecided -Bleeding: Mild and improving.  Patient states that she is changing pads every few hours and has had gradual improvement in volume.  Denies any large blood clots. -Pain: Patient's primary pain is at incision site and has been using oxycodone from hospital discharge but prefers to switch to Tylenol and request the prescription today.  She is able to ambulate and stepped onto the exam table with ease. -Bonding with infant: Going well. -Sexual activity: None -Mood: Expresses mood instability and frequent crying spells.  Denies any thoughts of self-harm or desire to harm the baby.  She has behavioral health follow-up already established.  PHQ score 4 today.  Discussed medication today. -I/O: Patient has decreased appetite but denies nausea, vomiting.  She is voiding regularly without any concerns.  She has started having bowel movements and denies straining or painful bowel movement.  Patient was varicella nonimmune during pregnancy.  OBJECTIVE:   BP 106/62   Pulse (!) 112   Wt 203 lb 9.6 oz (92.4 kg)   LMP  (LMP Unknown)   SpO2 97%   BMI 34.95 kg/m   Pulse recheck improved <100bpm General: NAD, pleasant, able to participate in exam Abdomen: soft, nontender, nondistended, +BS, removed surgical dressing from cesarean incision and revealed well-healing, nonbleeding, nonerythematous incision site. Fundus below umbilicus Extremities: trace edema. WWP. Skin: warm and dry, no rashes noted Neuro: alert and oriented, no focal deficits Psych: Normal affect and mood  ASSESSMENT/PLAN:   History of postpartum hypertension Patient was adherent with nifedipine post discharge without negative  side effects. Blood pressure 106/62 today and asymptomatic. -Discontinue antihypertensive -Blood pressure recheck in 2 weeks -Provided patient with guidance verbally and written for symptoms of hypertension that would warrant prompt reevaluation  Maternal varicella, non-immune Varicella vaccine administered today.  Patient tolerated well  Postpartum exam Discussed depressive symptoms and offered additional medical assistance.  She would like to think about this and get back to me. Patient already scheduled for behavioral health follow-up -Follow-up appointment in 2 weeks and will give Edinburgh -Incision healing well.  Discontinue oxycodone.  Prescribing Tylenol as needed for pain     Leeroy Bock, DO [facility]

## 2020-09-30 NOTE — Assessment & Plan Note (Addendum)
Discussed depressive symptoms and offered additional medical assistance.  She would like to think about this and get back to me. Patient already scheduled for behavioral health follow-up -Follow-up appointment in 2 weeks and will give Edinburgh -Incision healing well.  Discontinue oxycodone.  Prescribing Tylenol as needed for pain

## 2020-10-13 IMAGING — MR MR HEAD WO/W CM
17 of 24 series · 37 of 48 positions shown · IV contrast (Gadavist)
Comparison: None.

CLINICAL DATA: Infertility.  Galactorrhea.

EXAM:
MRI HEAD WITHOUT AND WITH CONTRAST
TECHNIQUE: Multiplanar, multiecho pulse sequences of the brain and surrounding
structures were obtained without and with intravenous contrast.
CONTRAST:  9mL GADAVIST GADOBUTROL 1 MMOL/ML IV SOLN

[Series 5: DWI · axial · 3.0mm · 0.88mm/px · z∈[-117,+21]mm · 6 of 96 slices shown (1 of 2)]
[im 1/96]
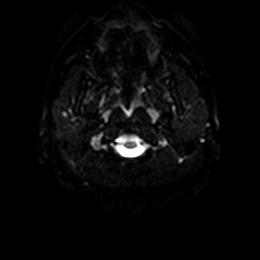
[im 20/96]
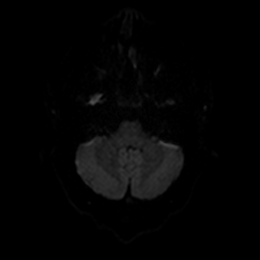
[im 39/96]
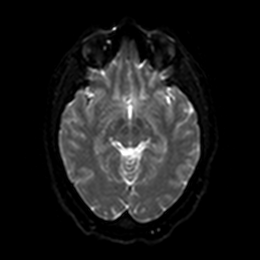
[im 58/96]
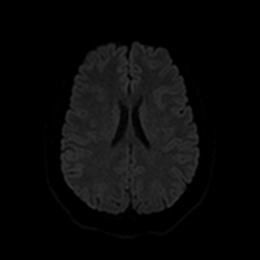
[im 77/96]
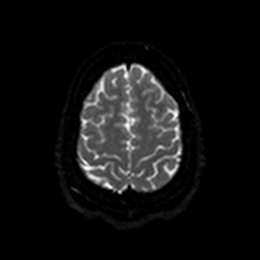
[im 96/96]
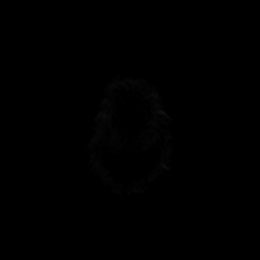

[Series 6: DWI · axial · 3.0mm · 0.88mm/px · z∈[-117,+21]mm · 2 of 48 slices shown (2 of 2)]
[im 1/48]
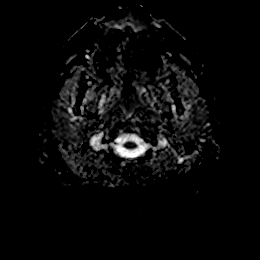
[im 48/48]
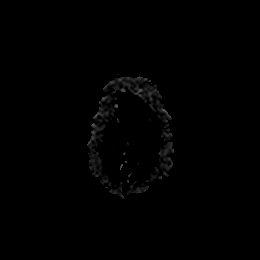

[Series 7: T1 · sagittal · 5.0mm · 0.75mm/px · 1 of 25 slices shown (1 of 3)]
[im 1/25]
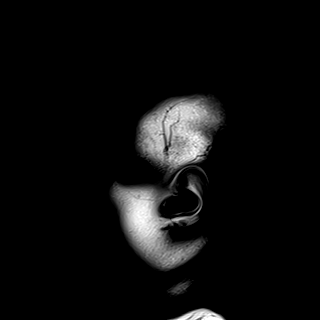

[Series 8: T2 · axial · 4.0mm · 0.72mm/px · 1 of 30 slices shown]
[im 1/30]
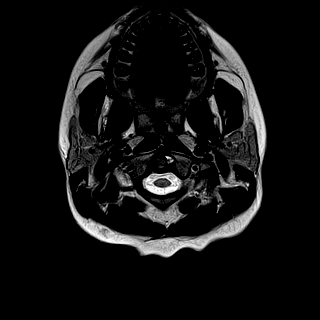

[Series 9: mag_images · axial · 3.0mm · 0.90mm/px · z∈[-128,+33]mm · 3 of 56 slices shown]
[im 1/56]
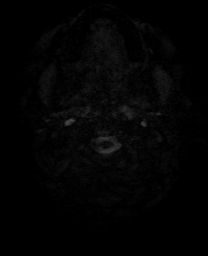
[im 28/56]
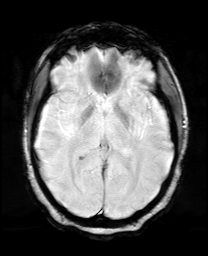
[im 56/56]
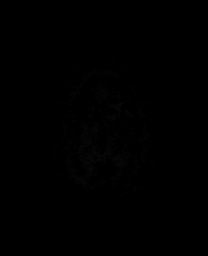

[Series 10: pha_images · axial · 3.0mm · 0.90mm/px · z∈[-128,+30]mm · 4 of 55 slices shown]
[im 1/55]
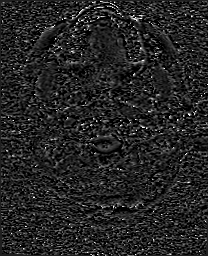
[im 19/55]
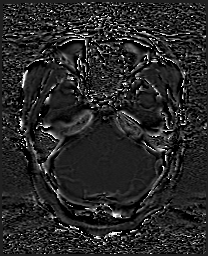
[im 37/55]
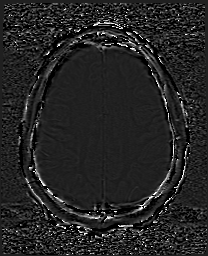
[im 55/55]
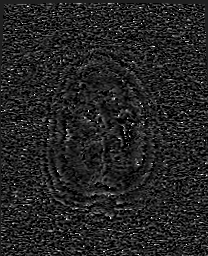

[Series 11: swi_images · axial · 3.0mm · 0.90mm/px · z∈[-128,+33]mm · 4 of 56 slices shown]
[im 1/56]
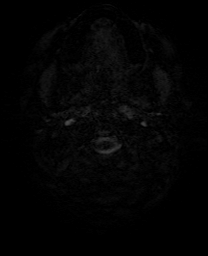
[im 19/56]
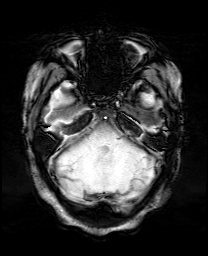
[im 37/56]
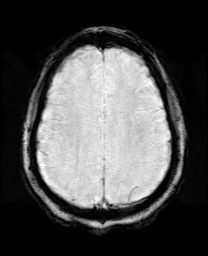
[im 56/56]
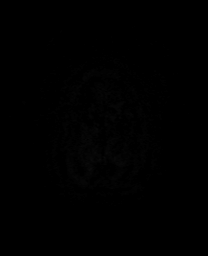

[Series 12: mip_images(sw) · axial · 24.0mm · 0.90mm/px · z∈[-118,+23]mm · 3 of 49 slices shown]
[im 1/49]
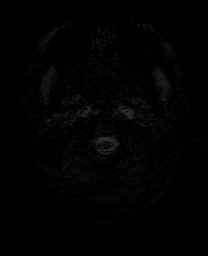
[im 25/49]
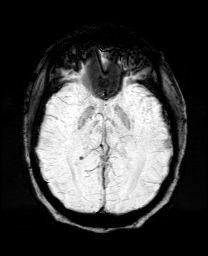
[im 49/49]
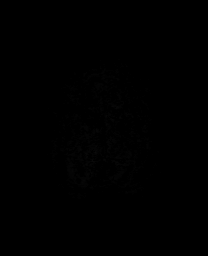

[Series 13: FLAIR · axial · 3.0mm · 0.45mm/px · z∈[-113,+18]mm · 3 of 40 slices shown]
[im 1/40]
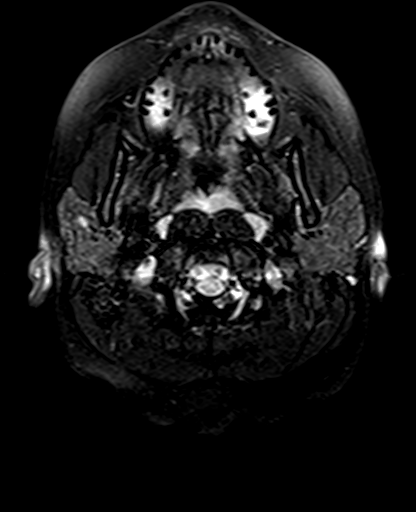
[im 20/40]
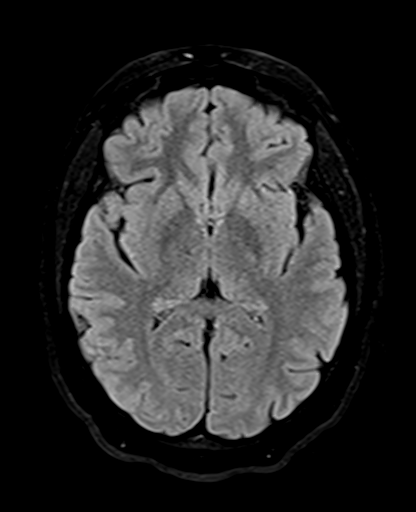
[im 40/40]
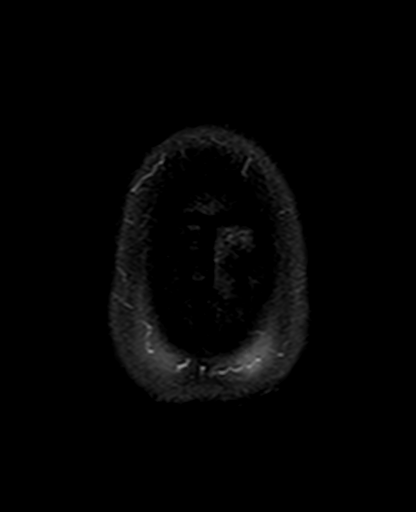

[Series 15: T1 · sagittal · 3.0mm · 0.25mm/px · 1 of 12 slices shown (2 of 3)]
[im 1/12]
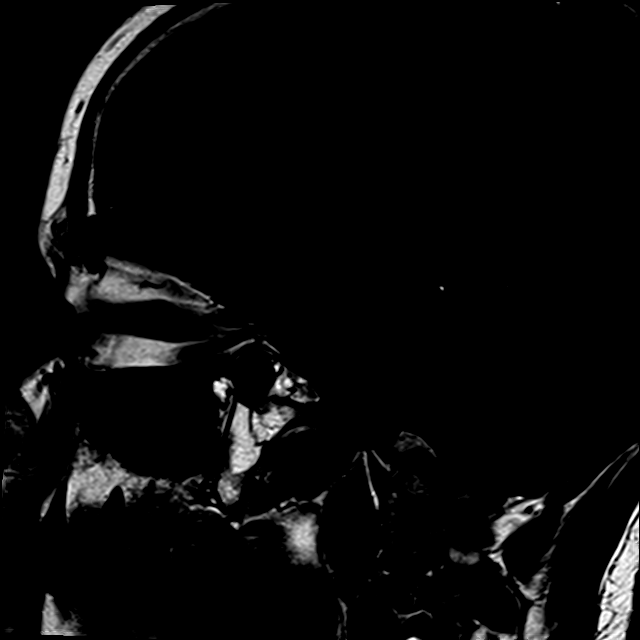

[Series 16: T1 · coronal · 3.0mm · 0.25mm/px · 1 of 13 slices shown (3 of 3)]
[im 1/13]
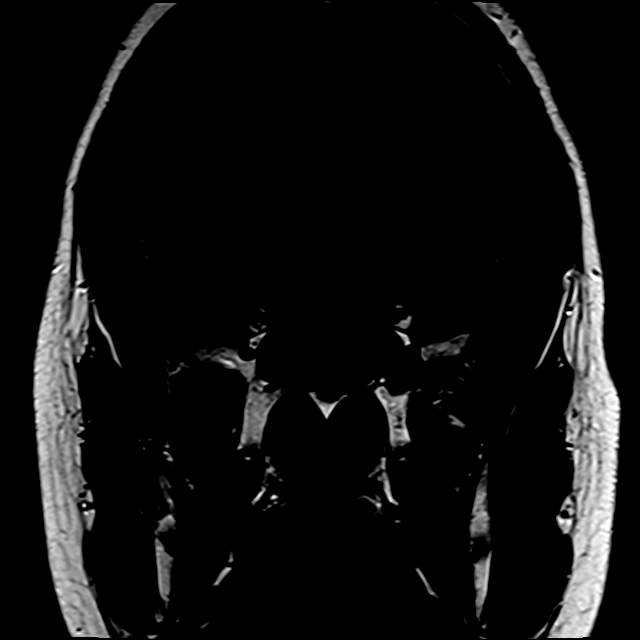

[Series 17: t1_tse_cor_dynamic pre · coronal · non-contrast · 3.0mm · 0.49mm/px · 1 of 7 slices shown]
[im 1/7]
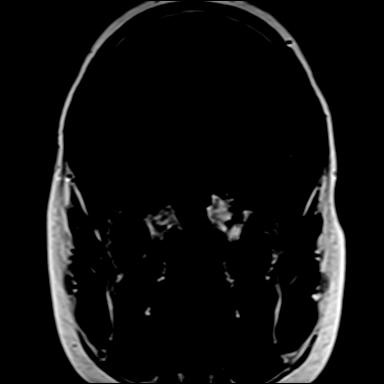

[Series 18: t1_tse_cor_dynamic post · coronal · 3.0mm · 0.49mm/px · 1 of 7 slices shown]
[im 1/7]
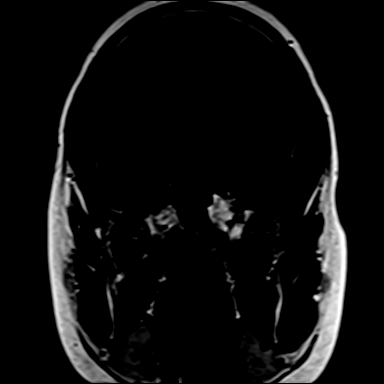

[Series 24: T2 post-contrast · coronal · 5.0mm · 0.72mm/px · 2 of 28 slices shown]
[im 1/28]
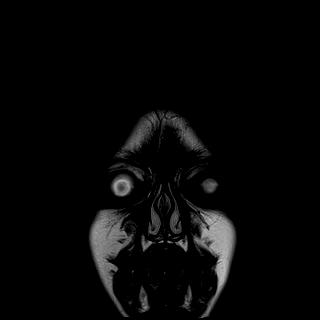
[im 28/28]
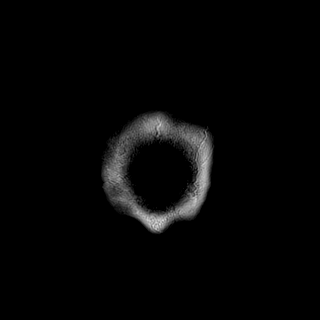

[Series 25: T1 post-contrast · sagittal · 3.0mm · 0.25mm/px · 1 of 12 slices shown (1 of 3)]
[im 1/12]
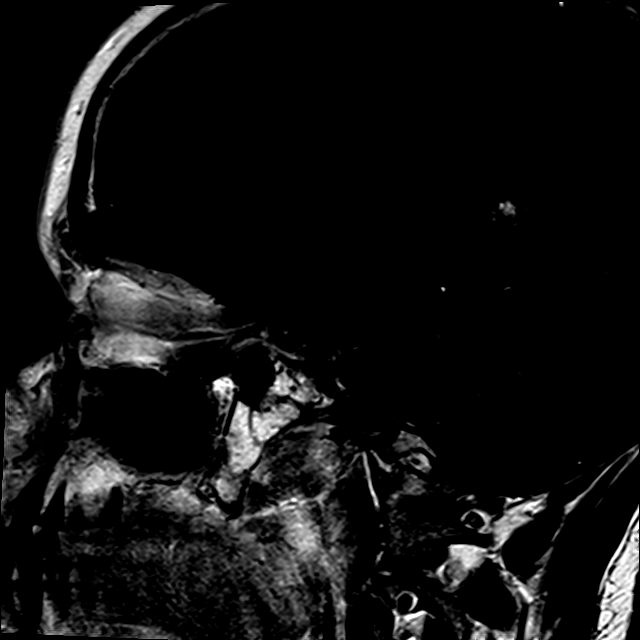

[Series 26: T1 post-contrast · coronal · 3.0mm · 0.25mm/px · 1 of 13 slices shown (2 of 3)]
[im 1/13]
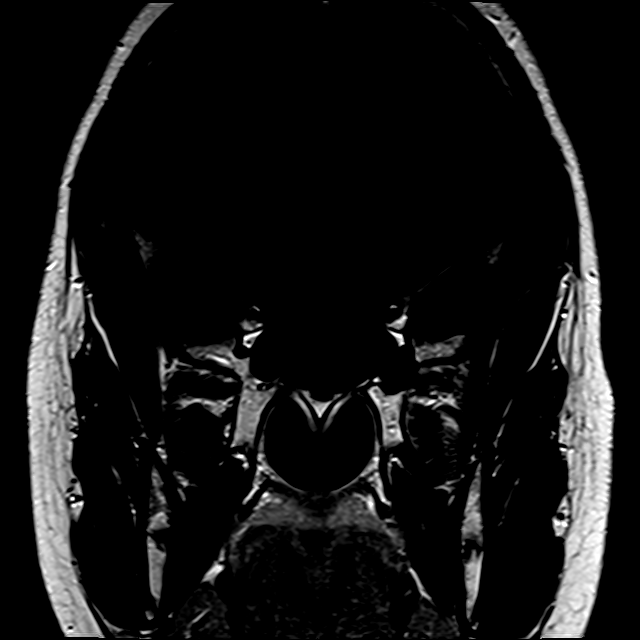

[Series 28: T1 post-contrast · coronal · 5.0mm · 0.34mm/px · 2 of 29 slices shown (3 of 3)]
[im 1/29]
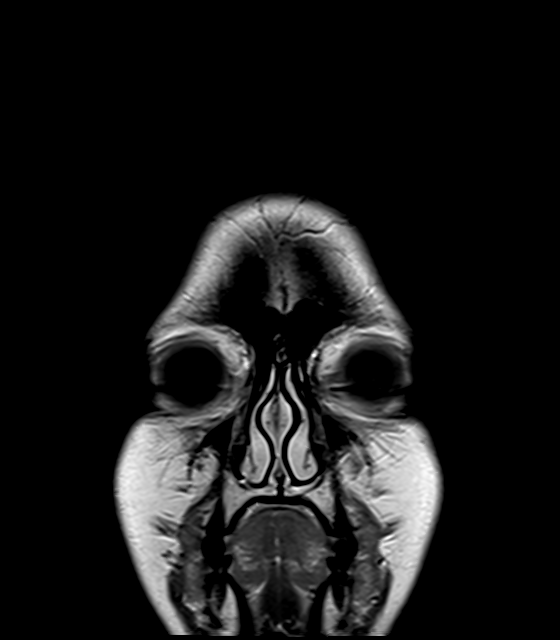
[im 29/29]
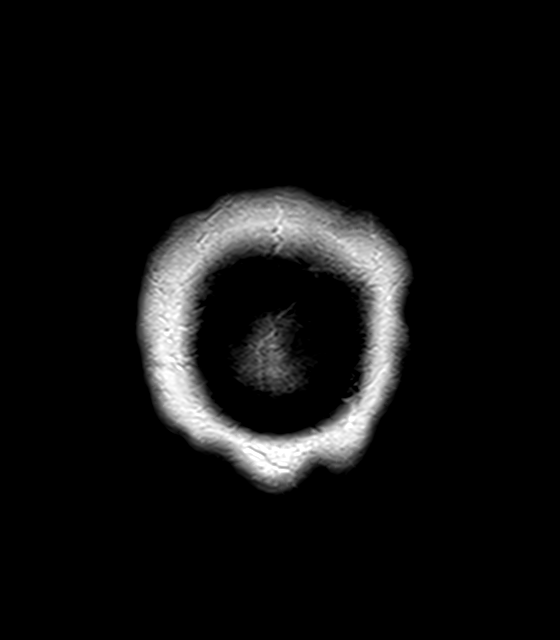

[37 of 48 positions shown; findings below may reference images not displayed]

FINDINGS: Brain: The brain itself has normal appearance without evidence of
malformation, atrophy, old or acute infarction, mass lesion,
hemorrhage, hydrocephalus or extra-axial collection. No abnormal
brain enhancement.

The pituitary gland measures 13 x 11 x 9 mm. The upper surface is
convex. The infundibulum is midline. The gland enhances in a
homogeneous fashion, with the exception of a 2-3 mm focus in the
inferior left side of the gland which could represent a micro
adenoma. No pituitary macro adenoma.

Vascular: Major vessels at the base of the brain show flow.

Skull and upper cervical spine: Negative

Sinuses/Orbits: Clear/normal

Other: None
IMPRESSION: Normal appearance of the brain itself.

No pituitary macro adenoma. 2-3 mm hypoenhancing focus within the
inferior left side of the gland which could represent a micro
adenoma.

## 2020-10-14 ENCOUNTER — Ambulatory Visit: Payer: Medicaid Other | Admitting: Student in an Organized Health Care Education/Training Program

## 2020-10-14 ENCOUNTER — Ambulatory Visit: Payer: Medicaid Other | Admitting: Clinical

## 2020-10-14 DIAGNOSIS — Z5329 Procedure and treatment not carried out because of patient's decision for other reasons: Secondary | ICD-10-CM

## 2020-10-14 DIAGNOSIS — Z91199 Patient's noncompliance with other medical treatment and regimen due to unspecified reason: Secondary | ICD-10-CM

## 2020-10-15 ENCOUNTER — Telehealth: Payer: Self-pay

## 2020-10-15 ENCOUNTER — Encounter: Payer: Self-pay | Admitting: Student in an Organized Health Care Education/Training Program

## 2020-10-15 NOTE — Telephone Encounter (Signed)
Patient calls nurse line requesting to speak with PCP. Patient reports she just had unprotected sex and is wanting the Plan B pill. Patient advised you can get this over the counter, however she needs medicaid to cover it. Will forward to PCP.

## 2020-10-19 ENCOUNTER — Encounter: Payer: Self-pay | Admitting: Student in an Organized Health Care Education/Training Program

## 2020-10-27 ENCOUNTER — Ambulatory Visit: Payer: Medicaid Other | Admitting: Student in an Organized Health Care Education/Training Program

## 2020-11-06 ENCOUNTER — Other Ambulatory Visit: Payer: Self-pay

## 2020-11-06 ENCOUNTER — Encounter: Payer: Self-pay | Admitting: Student in an Organized Health Care Education/Training Program

## 2020-11-06 ENCOUNTER — Ambulatory Visit (INDEPENDENT_AMBULATORY_CARE_PROVIDER_SITE_OTHER): Payer: Medicaid Other | Admitting: Student in an Organized Health Care Education/Training Program

## 2020-11-06 VITALS — BP 110/60 | HR 93 | Ht 64.0 in | Wt 196.4 lb

## 2020-11-06 DIAGNOSIS — Z3200 Encounter for pregnancy test, result unknown: Secondary | ICD-10-CM

## 2020-11-06 LAB — POCT URINE PREGNANCY: Preg Test, Ur: NEGATIVE

## 2020-11-06 NOTE — Progress Notes (Signed)
SUBJECTIVE:   CHIEF COMPLAINT / HPI: pp check  Repeat Postpartum visit: C/S 2/19 -Breastfeeding: no -Contraception: interested in starting today. She is interested in the OCP or nexplanon.  Remains undecided. Patient has been sexually active for several weeks already and endorses having pain with penetration and initially had bleeding associated with intercourse as well but has not had bleeding for couple weeks now.  She states that she has not wanted to have sex but feels guilty when her partner wants to have sex so she has agreed multiple times since delivery.  The penetration is painful and she believes this is due to dry vaginal tissue.  She has not been on birth control but states that she has used condoms every time.  Has not had return of her period. -Bonding with infant: She feels like she is bonding well with the infant.  Denies any thoughts of harming self or infant.  Endorses that she has frequent crying spells or is easily agitated and experiences low motivation.  Patient declines counseling or medication resources at this time.  She states that she has a good support from family and speaks frequently with her mother which helps resolve her episodes of sadness.  OBJECTIVE:   BP 110/60   Pulse 93   Ht 5\' 4"  (1.626 m)   Wt 196 lb 6 oz (89.1 kg)   LMP  (LMP Unknown)   SpO2 97%   BMI 33.71 kg/m   Physical Exam Vitals and nursing note reviewed.  Constitutional:      Appearance: Normal appearance.  HENT:     Head: Normocephalic.  Cardiovascular:     Rate and Rhythm: Normal rate and regular rhythm.     Pulses: Normal pulses.     Heart sounds: Normal heart sounds.  Pulmonary:     Effort: Pulmonary effort is normal.     Breath sounds: Normal breath sounds.  Skin:    General: Skin is warm and dry.     Capillary Refill: Capillary refill takes less than 2 seconds.     Comments: LTCS incision healing very well without signs of infection or opening  Neurological:     General:  No focal deficit present.     Mental Status: She is alert and oriented to person, place, and time.  Psychiatric:        Mood and Affect: Mood normal.        Behavior: Behavior normal.    Edinburgh Postnatal Depression Scale Screening Tool 11/06/2020 09/22/2020  I have been able to laugh and see the funny side of things. 1 0  I have looked forward with enjoyment to things. 1 0  I have blamed myself unnecessarily when things went wrong. 2 3  I have been anxious or worried for no good reason. 3 3  I have felt scared or panicky for no good reason. 0 3  Things have been getting on top of me. 3 2  I have been so unhappy that I have had difficulty sleeping. 1 2  I have felt sad or miserable. 1 2  I have been so unhappy that I have been crying. 1 3  The thought of harming myself has occurred to me. 0 2  Edinburgh Postnatal Depression Scale Total 13 20     ASSESSMENT/PLAN:   Postpartum exam Edinburgh evaluation is improved at this visit but subjectively patient endorses feeling worse. Denies any thoughts for harming herself or the event. Declines counseling or medications at this time and  instead endorses good connection with her mother for her support system. In addition, identified the patient has been sexually active and having painful intercourse but feels pressure from her partner to resume activity before she has been ready.  Spent an extensive amount of time with patient discussing her safety and health.  After our discussion, patient agreed and felt that she could follow through with my recommendation to stop sexual activity at this time.  The plan we discussed is to slowly trial penetration again in 1 week using a lot of water-based lubricant with condom if she is not on birth control at that time.  If she experiences pain again, she will not proceed with intercourse but will instead come back for evaluation including pelvic exam. -Urine pregnancy test today is negative. -Patient is to  return for follow-up visit or call provider with her decision on birth control method.  She is still undecided between OCPs and Nexplanon.     Leeroy Bock, DO [facility]

## 2020-11-06 NOTE — Patient Instructions (Signed)
It was a pleasure to see you today!  To summarize our discussion for this visit:  I'm glad to hear that you are doing well overall.  It sounds as though you are not fully healed from delivery. I highly recommend you abstain from sex for at least one week before trying again. At that time, use plenty of lubrication and start slowly. If you experience pain again, stop and wait longer to try again. Let me know at that point if you are still having problems and we can discuss more options.  We are getting a urine pregnancy test today and will then decide on a method of birth control.  Call the clinic at [phone] if your symptoms worsen or you have any concerns.   Thank you for allowing me to take part in your care,  Dr. Jamelle Rushing

## 2020-11-07 NOTE — Assessment & Plan Note (Signed)
Edinburgh evaluation is improved at this visit but subjectively patient endorses feeling worse. Denies any thoughts for harming herself or the event. Declines counseling or medications at this time and instead endorses good connection with her mother for her support system. In addition, identified the patient has been sexually active and having painful intercourse but feels pressure from her partner to resume activity before she has been ready.  Spent an extensive amount of time with patient discussing her safety and health.  After our discussion, patient agreed and felt that she could follow through with my recommendation to stop sexual activity at this time.  The plan we discussed is to slowly trial penetration again in 1 week using a lot of water-based lubricant with condom if she is not on birth control at that time.  If she experiences pain again, she will not proceed with intercourse but will instead come back for evaluation including pelvic exam. -Urine pregnancy test today is negative. -Patient is to return for follow-up visit or call provider with her decision on birth control method.  She is still undecided between OCPs and Nexplanon.

## 2020-11-16 ENCOUNTER — Ambulatory Visit (HOSPITAL_COMMUNITY)
Admission: EM | Admit: 2020-11-16 | Discharge: 2020-11-16 | Disposition: A | Discharge status: Home / Self Care | Payer: Medicaid Other | Attending: Emergency Medicine | Admitting: Emergency Medicine

## 2020-11-16 ENCOUNTER — Encounter (HOSPITAL_COMMUNITY): Payer: Self-pay

## 2020-11-16 DIAGNOSIS — N898 Other specified noninflammatory disorders of vagina: Secondary | ICD-10-CM | POA: Diagnosis not present

## 2020-11-16 DIAGNOSIS — B373 Candidiasis of vulva and vagina: Secondary | ICD-10-CM

## 2020-11-16 DIAGNOSIS — B3731 Acute candidiasis of vulva and vagina: Secondary | ICD-10-CM

## 2020-11-16 DIAGNOSIS — Z113 Encounter for screening for infections with a predominantly sexual mode of transmission: Secondary | ICD-10-CM

## 2020-11-16 MED ORDER — FLUCONAZOLE 150 MG PO TABS: 150.0000 mg | ORAL_TABLET | Freq: Every day | ORAL | 0 refills | Status: DC

## 2020-11-16 NOTE — ED Triage Notes (Signed)
Pt in with c/o vaginal itching and discharge. Also c/o lower abdominal pain that has been going on for 3 days now

## 2020-11-16 NOTE — ED Provider Notes (Signed)
[facility]    CSN: [id number] Arrival date & time: 11/16/20  1026      History   Chief Complaint Chief Complaint  Patient presents with  . Vaginal Itching  . Vaginal Discharge  . Abdominal Pain    HPI Angela Johnston is a 20 y.o. female.   Patient here for evaluation of vaginal itch and discharge.  Reports symptoms have been ongoing for the past several days.  Denies any odor.  Reports discharge as thick, white, and similar to cottage cheese.  Patient is sexually active with one partner.  Denies any contacts or possible exposures to STI.  Has not tried any OTC medications or treatments. Denies any specific alleviating or aggravating factors.  Denies any fevers, chest pain, shortness of breath, N/V/D, numbness, tingling, weakness, abdominal pain, or headaches.   ROS: As per HPI, all other pertinent ROS negative   The history is provided by the patient.  Vaginal Itching Associated symptoms include abdominal pain.  Vaginal Discharge Associated symptoms: abdominal pain and vaginal itching   Abdominal Pain Associated symptoms: vaginal discharge     Past Medical History:  Diagnosis Date  . Medical history non-contributory     Patient Active Problem List   Diagnosis Date Noted  . History of postpartum hypertension 09/30/2020  . Postpartum exam 09/30/2020  . Sickle cell trait (HCC) 07/16/2020  . Maternal varicella, non-immune 06/19/2020    Past Surgical History:  Procedure Laterality Date  . CESAREAN SECTION N/A 09/21/2020   Procedure: CESAREAN SECTION;  Surgeon: Adam Phenix, MD;  Location: [facility];  Service: Obstetrics;  Laterality: N/A;  . NO PAST SURGERIES    . WISDOM TOOTH EXTRACTION      OB History    Gravida  1   Para  1   Term  1   Preterm      AB      Living  1     SAB      IAB      Ectopic      Multiple  0   Live Births  1            Home Medications    Prior to Admission medications   Medication Sig Start Date End  Date Taking? Authorizing Provider  fluconazole (DIFLUCAN) 150 MG tablet Take 1 tablet (150 mg total) by mouth daily. Take one tablet now and one in 3 days if you are still having symptoms 11/16/20  Yes Ivette Loyal, NP  acetaminophen (TYLENOL 8 HOUR) 650 MG CR tablet Take 1 tablet (650 mg total) by mouth every 8 (eight) hours as needed for pain. 09/30/20   Anderson, Chelsey L, DO  enalapril (VASOTEC) 5 MG tablet Take 1 tablet (5 mg total) by mouth daily. 09/25/20   Warden Fillers, MD  enalapril (VASOTEC) 5 MG tablet TAKE 1 TABLET (5 MG TOTAL) BY MOUTH DAILY. 09/24/20 09/24/21  Warden Fillers, MD  ferrous sulfate 325 (65 FE) MG tablet Take 325 mg by mouth every other day.    [provider]  NIFEdipine (ADALAT CC) 60 MG 24 hr tablet Take 1 tablet (60 mg total) by mouth daily. 09/25/20   Warden Fillers, MD  NIFEdipine (ADALAT CC) 60 MG 24 hr tablet TAKE 1 TABLET (60 MG TOTAL) BY MOUTH DAILY. 09/24/20 09/24/21  Warden Fillers, MD  oxyCODONE (OXY IR/ROXICODONE) 5 MG immediate release tablet Take 1-2 tablets (5-10 mg total) by mouth every 4 (four) hours  as needed for moderate pain (May add APAP with OxyIR for moderate pain). 09/24/20   Warden Fillers, MD  oxyCODONE (OXY IR/ROXICODONE) 5 MG immediate release tablet TAKE 1-2 TABLETS (5-10 MG TOTAL) BY MOUTH EVERY 4 (FOUR) HOURS AS NEEDED FOR MODERATE PAIN (MAY ADD APAP WITH OXYIR FOR MODERATE PAIN). 09/24/20 03/23/21  Warden Fillers, MD  Prenatal Vit-Fe Fumarate-FA (PRENATAL MULTIVITAMIN) TABS tablet Take 1 tablet by mouth daily at 12 noon.    [provider]  psyllium (METAMUCIL) 28 % packet Take 1 packet by mouth 2 (two) times daily. 07/23/20   Leeroy Bock, DO    Family History Family History  Problem Relation Age of Onset  . Healthy Mother   . Healthy Father   . Heart murmur Maternal Grandmother     Social History Social History   Tobacco Use  . Smoking status: Never Smoker  . Smokeless tobacco: Never Used  Vaping  Use  . Vaping Use: Never used  Substance Use Topics  . Alcohol use: Not Currently  . Drug use: Not Currently     Allergies   Patient has no known allergies.   Review of Systems Review of Systems  Gastrointestinal: Positive for abdominal pain.  Genitourinary: Positive for vaginal discharge.  All other systems reviewed and are negative.    Physical Exam Triage Vital Signs ED Triage Vitals  Enc Vitals Group     BP 11/16/20 1103 121/71     Pulse Rate 11/16/20 1103 64     Resp 11/16/20 1103 18     Temp 11/16/20 1103 98.4 F (36.9 C)     Temp src --      SpO2 11/16/20 1103 98 %     Weight --      Height --      Head Circumference --      Peak Flow --      Pain Score 11/16/20 1102 0     Pain Loc --      Pain Edu? --      Excl. in GC? --    No data found.  Updated Vital Signs BP 121/71   Pulse 64   Temp 98.4 F (36.9 C)   Resp 18   LMP  (LMP Unknown)   SpO2 98%   Breastfeeding No   Visual Acuity Right Eye Distance:   Left Eye Distance:   Bilateral Distance:    Right Eye Near:   Left Eye Near:    Bilateral Near:     Physical Exam Vitals and nursing note reviewed.  Constitutional:      General: She is not in acute distress.    Appearance: Normal appearance. She is not ill-appearing, toxic-appearing or diaphoretic.  HENT:     Head: Normocephalic and atraumatic.  Eyes:     Conjunctiva/sclera: Conjunctivae normal.  Cardiovascular:     Rate and Rhythm: Normal rate.     Pulses: Normal pulses.  Pulmonary:     Effort: Pulmonary effort is normal.  Abdominal:     General: Abdomen is flat.     Palpations: Abdomen is soft.  Genitourinary:    Comments: declines Musculoskeletal:        General: Normal range of motion.     Cervical back: Normal range of motion.  Skin:    General: Skin is warm and dry.  Neurological:     General: No focal deficit present.     Mental Status: She is alert and oriented to person, place, and  time.  Psychiatric:        Mood  and Affect: Mood normal.      UC Treatments / Results  Labs (all labs ordered are listed, but only abnormal results are displayed) Labs Reviewed  CERVICOVAGINAL ANCILLARY ONLY    EKG   Radiology No results found.  Procedures Procedures (including critical care time)  Medications Ordered in UC Medications - No data to display  Initial Impression / Assessment and Plan / UC Course  I have reviewed the triage vital signs and the nursing notes.  Pertinent labs & imaging results that were available during my care of the patient were reviewed by me and considered in my medical decision making (see chart for details).     Assessment negative for red flags or concerns.  Based on symptoms this is likely a yeast vaginitis.  Will treat with Diflucan x1 dose today with a repeat dose in 3 days if symptoms do not improve.  We will also obtain self swab and will treat based on results.  May use OTC antiitch vaginal creams for comfort.  Encourage fluid intake.  Discussed avoiding tight fitting clothing and wearing breathable fabrics.  Trying to keep area clean and dry.  Discussed safe sex practices including condom or other barrier method used.  Follow-up as needed.  Final Clinical Impressions(s) / UC Diagnoses   Final diagnoses:  Yeast vaginitis  Screen for STD (sexually transmitted disease)  Vaginal irritation     Discharge Instructions     Take Diflucan 1 tablet today.  If you are still experiencing symptoms in 3 days you may take the second pill.  You may use over-the-counter vaginal antiitch cream for comfort.  Avoid wearing tight fitting clothing and wear breathable fabrics like cotton. Try to keep the area clean and dry.   We will contact you with the results from your lab work and any additional treatment.    Do not have sex while taking undergoing treatment for STI.  Make sure that all of your partners get tested and treated.   Use a condom or other barrier method for  all sexual encounters.    Return or go to the Emergency Department if symptoms worsen or do not improve in the next few days.     ED Prescriptions    Medication Sig Dispense Auth. Provider   fluconazole (DIFLUCAN) 150 MG tablet Take 1 tablet (150 mg total) by mouth daily. Take one tablet now and one in 3 days if you are still having symptoms 2 tablet Ivette Loyal, NP     PDMP not reviewed this encounter.   Ivette Loyal, NP 11/16/20 1150

## 2020-11-16 NOTE — Discharge Instructions (Addendum)
Take Diflucan 1 tablet today.  If you are still experiencing symptoms in 3 days you may take the second pill.  You may use over-the-counter vaginal antiitch cream for comfort.  Avoid wearing tight fitting clothing and wear breathable fabrics like cotton. Try to keep the area clean and dry.   We will contact you with the results from your lab work and any additional treatment.    Do not have sex while taking undergoing treatment for STI.  Make sure that all of your partners get tested and treated.   Use a condom or other barrier method for all sexual encounters.    Return or go to the Emergency Department if symptoms worsen or do not improve in the next few days.

## 2020-11-17 LAB — CERVICOVAGINAL ANCILLARY ONLY
Bacterial Vaginitis (gardnerella): NEGATIVE
Candida Glabrata: NEGATIVE
Candida Vaginitis: POSITIVE — AB
Chlamydia: NEGATIVE
Comment: NEGATIVE
Comment: NEGATIVE
Comment: NEGATIVE
Comment: NEGATIVE
Comment: NEGATIVE
Comment: NORMAL
Neisseria Gonorrhea: NEGATIVE
Trichomonas: NEGATIVE

## 2020-11-20 ENCOUNTER — Other Ambulatory Visit: Payer: Self-pay

## 2020-11-20 ENCOUNTER — Ambulatory Visit (INDEPENDENT_AMBULATORY_CARE_PROVIDER_SITE_OTHER): Payer: Medicaid Other | Admitting: Family Medicine

## 2020-11-20 VITALS — Wt 194.5 lb

## 2020-11-20 DIAGNOSIS — Z30017 Encounter for initial prescription of implantable subdermal contraceptive: Secondary | ICD-10-CM | POA: Diagnosis not present

## 2020-11-20 DIAGNOSIS — Z3046 Encounter for surveillance of implantable subdermal contraceptive: Secondary | ICD-10-CM | POA: Diagnosis not present

## 2020-11-20 LAB — POCT URINE PREGNANCY: Preg Test, Ur: NEGATIVE

## 2020-11-20 NOTE — Patient Instructions (Addendum)
Nexplanon Instructions After Insertion   Keep bandage clean and dry for 24 hours   May use ice/Tylenol/Ibuprofen for soreness or pain   If you develop fever, drainage or increased warmth from incision site-contact office immediately   Use condom for backup for up to 7 days after your procedure

## 2020-11-20 NOTE — Progress Notes (Signed)
  PRE-OP DIAGNOSIS: desired long-term, reversible contraception   POST-OP DIAGNOSIS: Same   PROCEDURE: Nexplanon  placement  Performing Physician: Janit Pagan, MD, MPH Supervising Physician (if applicable): _   PROCEDURE:   Site (check):          [X]      Left Arm          Lot#  [id number]: [id number]  Sterile Preparation:    [X]       Betadine        Insertion site was selected 8 - 10 cm from medial epicondyle and marked along with guiding site using sterile marker. A 3 cm insertion mark made below the sulcus line.  Procedure area was prepped and draped in a sterile fashion. 3 mL of 1% lidocaine  With epinephrine used for subcutaneous anesthesia. Anesthesia confirmed.   Nexplanon  trocar was inserted subcutaneously and then Nexplanon  capsule delivered subcutaneously. Trocar was removed from the insertion site. Nexplanon  capsule was palpated by provider and patient to assure satisfactory placement.  Estimated blood loss of 0  mL Dressings applied:     Gauze/Tape       Followup: The patient tolerated the procedure well without complications.  Standard post-procedure care is explained and return precautions are given.

## 2020-11-27 MED ORDER — ETONOGESTREL 68 MG ~~LOC~~ IMPL
68.0000 mg | DRUG_IMPLANT | Freq: Once | SUBCUTANEOUS | Status: AC
  Administered 2020-11-20: 68 mg via SUBCUTANEOUS

## 2020-11-27 NOTE — Addendum Note (Signed)
Addended by: Veronda Prude on: 11/27/2020 09:44 AM   Modules accepted: Orders

## 2021-01-03 DIAGNOSIS — R0981 Nasal congestion: Secondary | ICD-10-CM | POA: Diagnosis not present

## 2021-01-03 DIAGNOSIS — R059 Cough, unspecified: Secondary | ICD-10-CM | POA: Diagnosis not present

## 2021-01-03 DIAGNOSIS — Z20822 Contact with and (suspected) exposure to covid-19: Secondary | ICD-10-CM | POA: Diagnosis not present

## 2021-01-09 ENCOUNTER — Ambulatory Visit: Payer: Medicaid Other

## 2021-01-26 ENCOUNTER — Other Ambulatory Visit: Payer: Self-pay

## 2021-01-26 ENCOUNTER — Encounter: Payer: Self-pay | Admitting: Student in an Organized Health Care Education/Training Program

## 2021-01-26 ENCOUNTER — Ambulatory Visit (INDEPENDENT_AMBULATORY_CARE_PROVIDER_SITE_OTHER): Payer: Medicaid Other | Admitting: Student in an Organized Health Care Education/Training Program

## 2021-01-26 VITALS — BP 123/72 | HR 86 | Ht 64.0 in | Wt 196.0 lb

## 2021-01-26 DIAGNOSIS — Z3200 Encounter for pregnancy test, result unknown: Secondary | ICD-10-CM

## 2021-01-26 DIAGNOSIS — N926 Irregular menstruation, unspecified: Secondary | ICD-10-CM

## 2021-01-26 DIAGNOSIS — R42 Dizziness and giddiness: Secondary | ICD-10-CM

## 2021-01-26 LAB — POCT URINE PREGNANCY: Preg Test, Ur: NEGATIVE

## 2021-01-26 NOTE — Patient Instructions (Signed)
It was a pleasure to see you today!  To summarize our discussion for this visit: Your pregnancy test was negative. It is common to have decreased or no periods while on the nexplanon. Please be sure to still use barrier protection if having new partners to prevent infection and continue to monitor and make sure the nexplanon is in place.  For your dizziness, I would recommend seeing an optometrist to check your prescription and if still experiencing symptoms after this, please let us know.    Some additional health maintenance measures we should update are: Health Maintenance Due  Topic Date Due   COVID-19 Vaccine (1) Never done   Pneumococcal Vaccine 3-7 Years old (1 - PCV) Never done   PAP-Cervical Cytology Screening  01/12/2021   PAP SMEAR-Modifier  01/12/2021     Call the clinic at [phone] if your symptoms worsen or you have any concerns.   Thank you for allowing me to take part in your care,  Dr. Jamelle Rushing

## 2021-01-26 NOTE — Progress Notes (Signed)
    SUBJECTIVE:   CHIEF COMPLAINT / HPI: irregular periods.   "Missed period"- LMP may 2022 which was normal for her. Nexplanon inserted 4/21 and is still in place. Denies recent sexual activity. No pelvic pain or discharge. No other medication changes.   "Dizziness"- for several months. No fullness in ears or hearing issues. No balance issues or falling over. Not associated with movement/positional. No N/V. Worsens by end of day. 2 years since last prescription and relies on wearing glasses constantly. No medications likely contributing.  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 Not on medical therapy.   OBJECTIVE:   BP 123/72   Pulse 86   Ht 5\' 4"  (1.626 m)   Wt 196 lb (88.9 kg)   LMP 12/15/2020   SpO2 100%   BMI 33.64 kg/m   Physical Exam Vitals and nursing note reviewed.  Constitutional:      General: She is not in acute distress.    Appearance: She is not ill-appearing or toxic-appearing.  Cardiovascular:     Rate and Rhythm: Normal rate and regular rhythm.     Pulses: Normal pulses.     Heart sounds: Normal heart sounds.  Pulmonary:     Effort: Pulmonary effort is normal.     Breath sounds: Normal breath sounds.  Skin:    General: Skin is warm and dry.     Capillary Refill: Capillary refill takes less than 2 seconds.     Comments: Nexplanon palpated in proper place in left upper arm  Neurological:     General: No focal deficit present.     Mental Status: She is alert and oriented to person, place, and time. Mental status is at baseline.     GCS: GCS eye subscore is 4. GCS verbal subscore is 5. GCS motor subscore is 6.     Cranial Nerves: Cranial nerves are intact.     Motor: Motor function is intact.     Gait: Gait is intact.  Psychiatric:        Mood and Affect: Mood normal.        Behavior: Behavior normal.   ASSESSMENT/PLAN:   Menstrual periods irregular Patient had nexplanon inserted 2 months ago and had one normal cycle but is late for  the second after insertion. Pregnancy test negative today, device still in place.  Discussed with patient likely side effect of device to have reduced periods which can be light or none.  Continue to monitor that device is intact and return if having further concerns  Dizziness Description is suspicious for related to visual etiology in setting of old prescription.  Recommended close follow up with ophthalmology/optometry to check prescription and we can reassess if that does not resolve her issues. Unlikely central source.      12/17/2020, DO [facility]

## 2021-01-28 DIAGNOSIS — N926 Irregular menstruation, unspecified: Secondary | ICD-10-CM | POA: Insufficient documentation

## 2021-01-28 DIAGNOSIS — R42 Dizziness and giddiness: Secondary | ICD-10-CM | POA: Insufficient documentation

## 2021-01-28 NOTE — Assessment & Plan Note (Signed)
Patient had nexplanon inserted 2 months ago and had one normal cycle but is late for the second after insertion. Pregnancy test negative today, device still in place.  Discussed with patient likely side effect of device to have reduced periods which can be light or none.  Continue to monitor that device is intact and return if having further concerns

## 2021-01-28 NOTE — Assessment & Plan Note (Signed)
Description is suspicious for related to visual etiology in setting of old prescription.  Recommended close follow up with ophthalmology/optometry to check prescription and we can reassess if that does not resolve her issues. Unlikely central source.

## 2021-02-19 IMAGING — US US MFM OB FOLLOW-UP
1 series · 14 of 27 positions shown · non-contrast
Comparison: none

[Series 1: us mfm ob follow-up · 27 acquisitions, 14 frames shown]
[im 1/27]
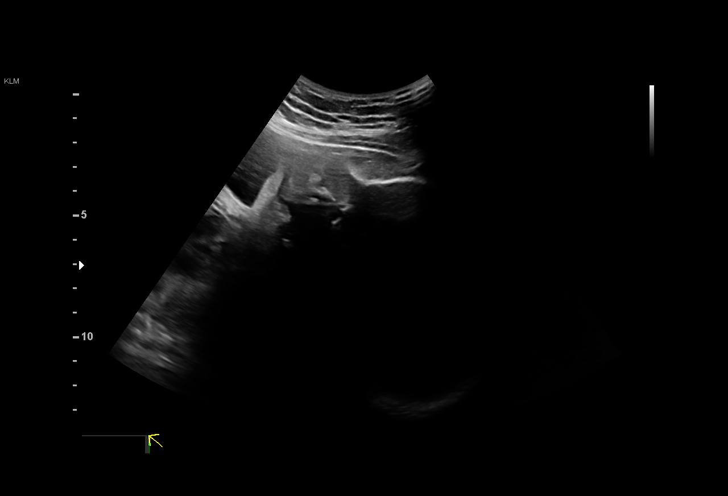
[im 3/27]
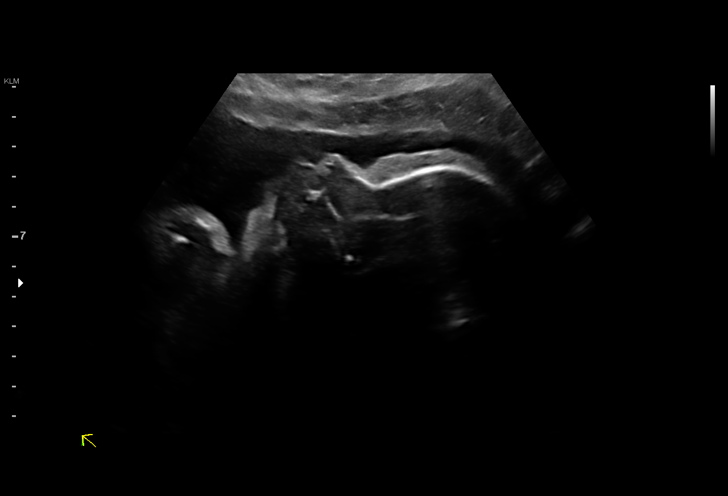
[im 5/27]
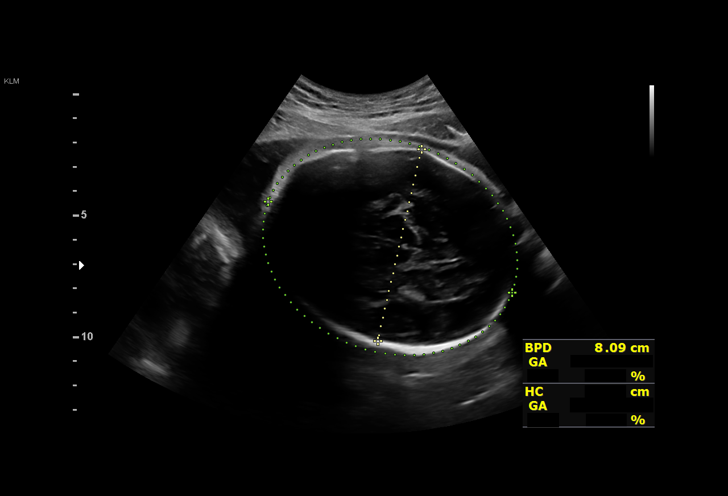
[im 7/27]
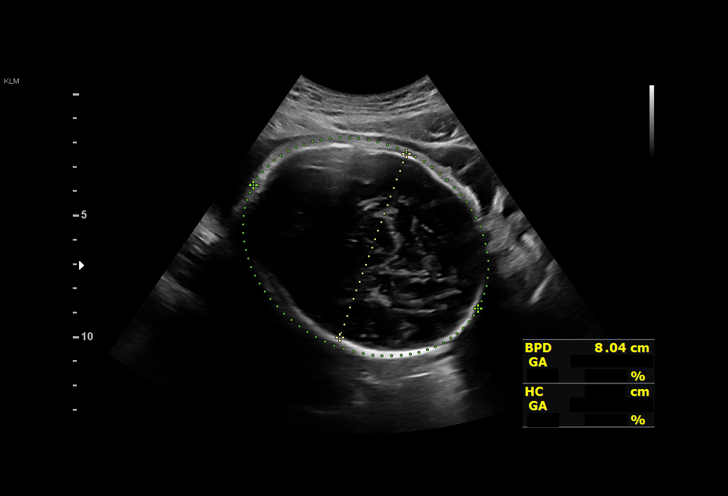
[im 9/27]
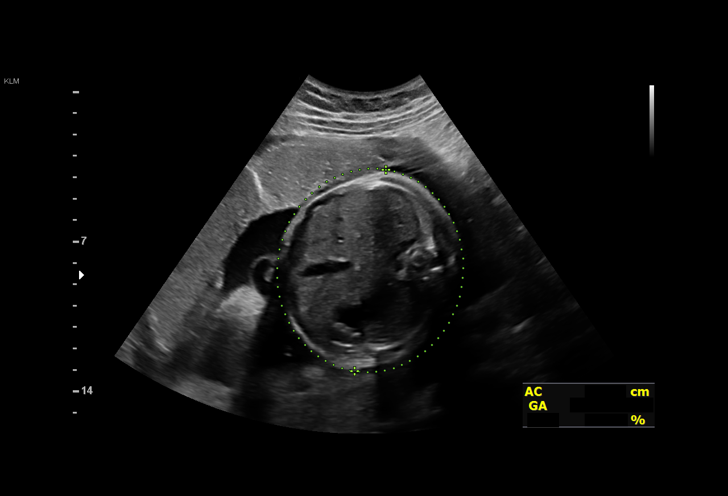
[im 11/27]
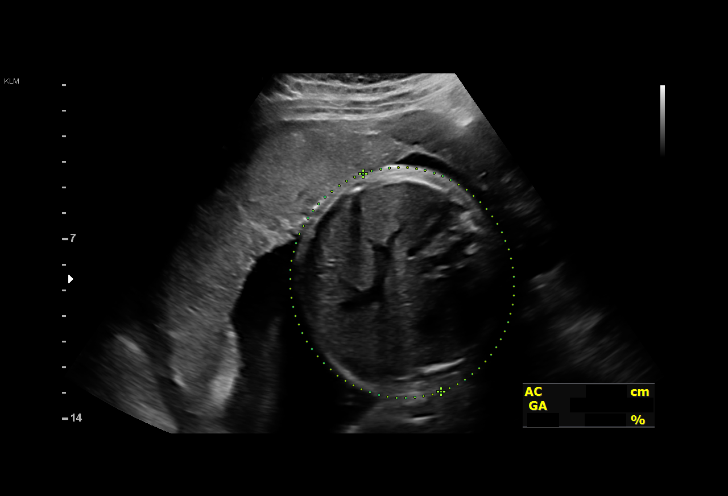
[im 13/27]
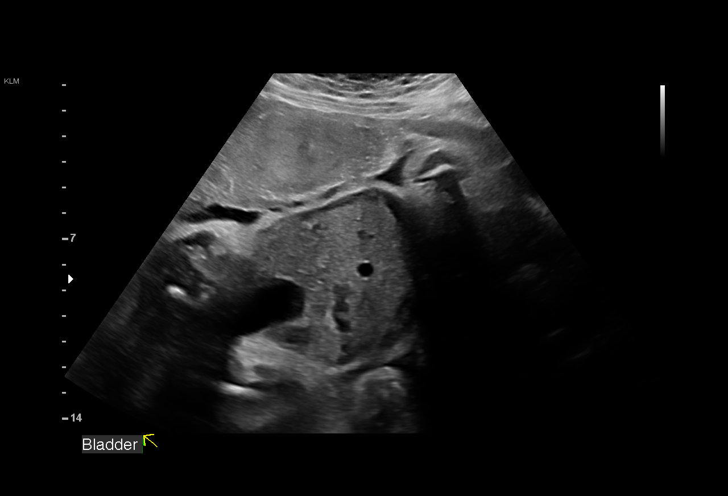
[im 15/27]
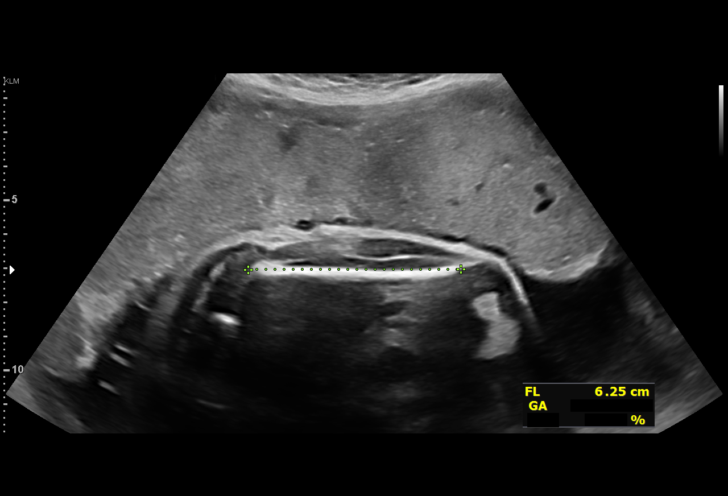
[im 17/27]
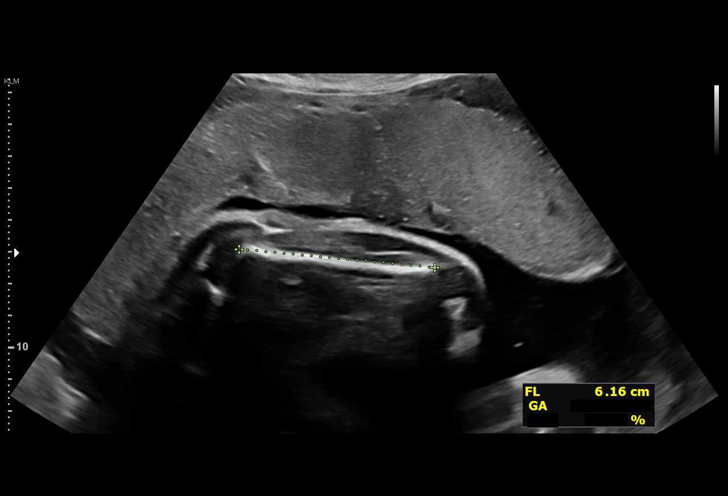
[im 19/27]
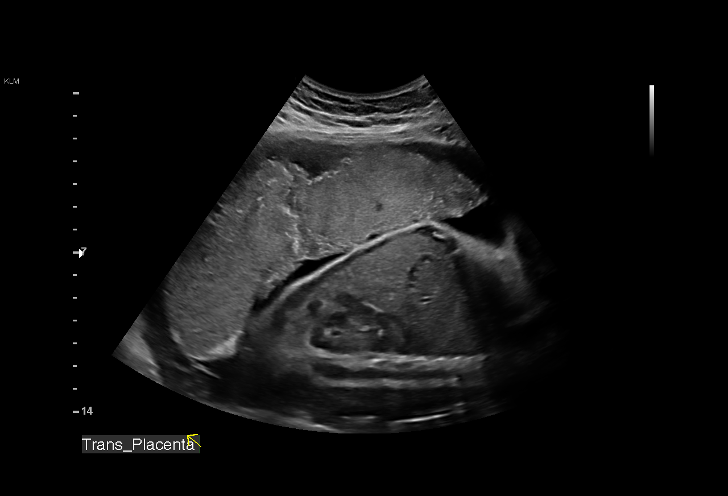
[im 21/27]
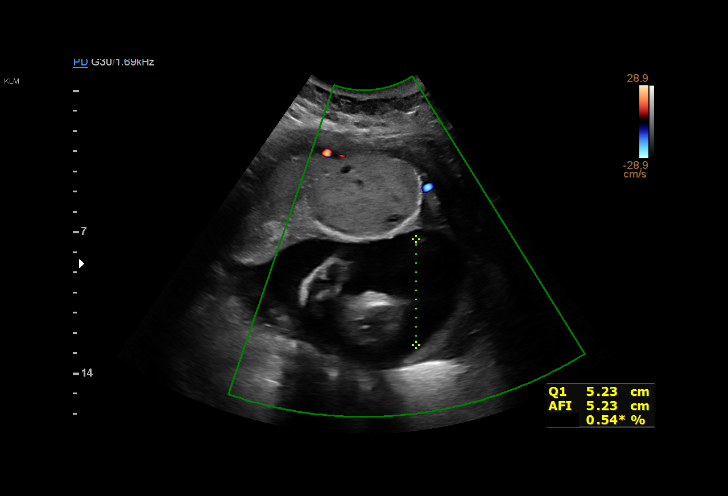
[im 23/27]
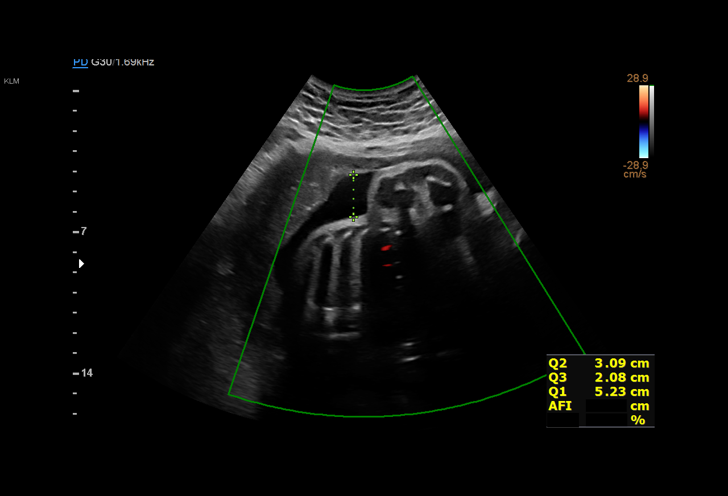
[im 25/27]
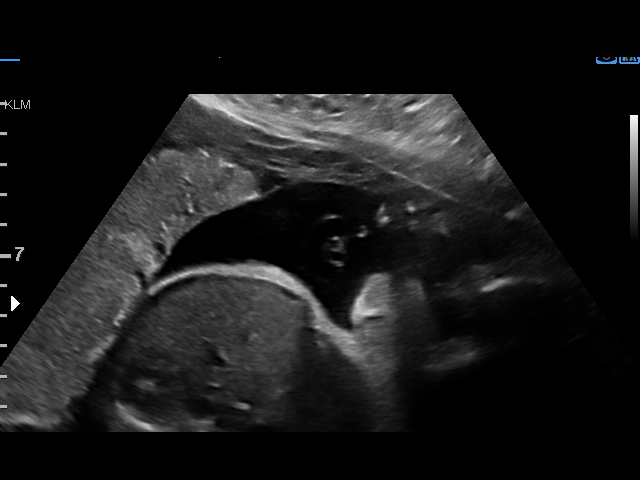
[im 27/27]
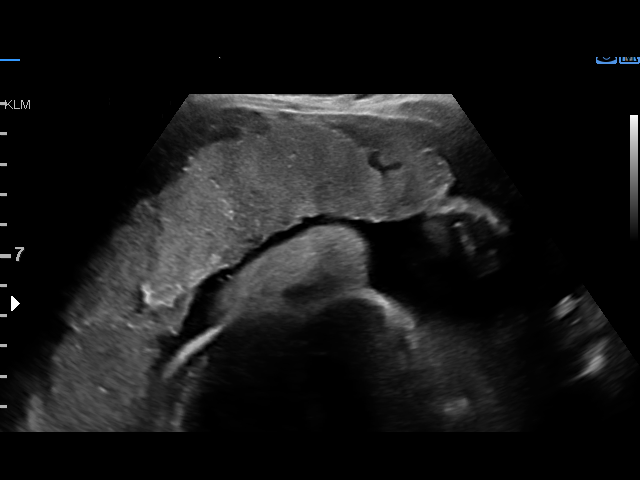

[14 of 27 positions shown; findings below may reference images not displayed]

Indications

 Obesity complicating pregnancy, third
 trimester (Prepreg BMI 35)
 32 weeks gestation of pregnancy
Fetal Evaluation

 Num Of Fetuses:          1
 Fetal Heart Rate(bpm):   141
 Cardiac Activity:        Observed
 Presentation:            Cephalic
 Placenta:                Anterior
 P. Cord Insertion:       Previously Visualized

 Amniotic Fluid
 AFI FV:      Within normal limits

 AFI Sum(cm)     %Tile       Largest Pocket(cm)
 13.27           42

 RUQ(cm)       RLQ(cm)       LUQ(cm)        LLQ(cm)

Biometry

 BPD:      80.7  mm     G. Age:  32w 3d         41  %    CI:        72.03   %    70 - 86
                                                         FL/HC:       20.3  %    19.1 -
 HC:      302.6  mm     G. Age:  33w 4d         44  %    HC/AC:       1.06       0.96 -
 AC:      284.9  mm     G. Age:  32w 4d         52  %    FL/BPD:      76.0  %    71 - 87
 FL:       61.3  mm     G. Age:  31w 6d         22  %    FL/AC:       21.5  %    20 - 24

 Est. FW:    5968   gm     4 lb 5 oz     39  %
OB History

 Blood Type:   B+
 Gravidity:    1         Term:   0        Prem:   0        SAB:   0
 TOP:          0       Ectopic:  0        Living: 0
Gestational Age

 U/S Today:     32w 4d                                        EDD:   09/19/20
 Best:          32w 3d     Det. By:  U/S  (04/04/20)          EDD:   09/20/20
Anatomy

 Cranium:               Appears normal         LVOT:                   Previously seen
 Cavum:                 Previously seen        Aortic Arch:            Previously seen
 Ventricles:            Appears normal         Ductal Arch:            Previously seen
 Choroid Plexus:        Previously seen        Diaphragm:              Appears normal
 Cerebellum:            Previously seen        Stomach:                Appears normal, left
                                                                       sided
 Posterior Fossa:       Previously seen        Abdomen:                Appears normal
 Nuchal Fold:           Previously seen        Abdominal Wall:         Previously seen
 Face:                  Orbits and profile     Cord Vessels:           Previously seen
                        previously seen
 Lips:                  Previously seen        Kidneys:                Appear normal
 Palate:                Previously seen        Bladder:                Appears normal
 Thoracic:              Appears normal         Spine:                  Previously seen
 Heart:                 Previously seen        Upper Extremities:      Previously seen
 RVOT:                  Previously seen        Lower Extremities:      Previously seen

 Other:  Fetus appears to be female. Nasal bone prev. visualized. Heels/feet
         and open hands/5th digits previously visualized. VC, 3VV and 3VTV
         previously visualized.
Cervix Uterus Adnexa

 Cervix
 Not visualized (advanced GA >16wks)
Impression

 Follow up growth due to maternal obesity
 Normal interval growth with measurements consistent with
 dates
 Good fetal movement and amniotic fluid volume
Recommendations

 Follow up as clinically indicated.

## 2021-03-17 ENCOUNTER — Other Ambulatory Visit: Payer: Self-pay

## 2021-03-17 ENCOUNTER — Encounter: Payer: Self-pay | Admitting: Emergency Medicine

## 2021-03-17 ENCOUNTER — Ambulatory Visit
Admission: EM | Admit: 2021-03-17 | Discharge: 2021-03-17 | Disposition: A | Discharge status: Home / Self Care | Payer: Medicaid Other | Attending: Family Medicine | Admitting: Family Medicine

## 2021-03-17 DIAGNOSIS — B370 Candidal stomatitis: Secondary | ICD-10-CM

## 2021-03-17 MED ORDER — NYSTATIN 100000 UNIT/ML MT SUSP: OROMUCOSAL | 0 refills | Status: DC

## 2021-03-17 NOTE — ED Triage Notes (Signed)
White patches on tongue.  States it hurts to eat and drink.  States tongue has been white for a while

## 2021-03-17 NOTE — ED Provider Notes (Signed)
[facility]    CSN: [id number] Arrival date & time: 03/17/21  1836      History   Chief Complaint No chief complaint on file.   HPI Angela Johnston is a 20 y.o. female.   HPI Patient presents today with white patches on tongue. Pain eating and drinking. No throat pain. No recent illness or treatment with prednisone.  Past Medical History:  Diagnosis Date   Medical history non-contributory     Patient Active Problem List   Diagnosis Date Noted   Menstrual periods irregular 01/28/2021   Dizziness 01/28/2021   History of postpartum hypertension 09/30/2020   Postpartum exam 09/30/2020   Sickle cell trait (HCC) 07/16/2020   Maternal varicella, non-immune 06/19/2020    Past Surgical History:  Procedure Laterality Date   CESAREAN SECTION N/A 09/21/2020   Procedure: CESAREAN SECTION;  Surgeon: Adam Phenix, MD;  Location: [facility] LD [facility]S;  Service: Obstetrics;  Laterality: N/A;   NO PAST SURGERIES     WISDOM TOOTH EXTRACTION      OB History     Gravida  1   Para  1   Term  1   Preterm      AB      Living  1      SAB      IAB      Ectopic      Multiple  0   Live Births  1            Home Medications    Prior to Admission medications   Medication Sig Start Date End Date Taking? Authorizing Provider  nystatin (MYCOSTATIN) 100000 UNIT/ML suspension Gargle and spit 5 ml 3 times daily as needed for thrush 03/17/21  Yes Bing Neighbors, FNP  acetaminophen (TYLENOL 8 HOUR) 650 MG CR tablet Take 1 tablet (650 mg total) by mouth every 8 (eight) hours as needed for pain. 09/30/20   Anderson, Chelsey L, DO  ferrous sulfate 325 (65 FE) MG tablet Take 325 mg by mouth every other day.    [provider]  fluconazole (DIFLUCAN) 150 MG tablet Take 1 tablet (150 mg total) by mouth daily. Take one tablet now and one in 3 days if you are still having symptoms 11/16/20   Ivette Loyal, NP  psyllium (METAMUCIL) 28 % packet Take 1 packet by mouth  2 (two) times daily. 07/23/20   Leeroy Bock, DO    Family History Family History  Problem Relation Age of Onset   Healthy Mother    Healthy Father    Heart murmur Maternal Grandmother     Social History Social History   Tobacco Use   Smoking status: Never   Smokeless tobacco: Never  Vaping Use   Vaping Use: Never used  Substance Use Topics   Alcohol use: Not Currently   Drug use: Not Currently     Allergies   Patient has no known allergies.   Review of Systems Review of Systems Pertinent negatives listed in HPI   Physical Exam Triage Vital Signs ED Triage Vitals  Enc Vitals Group     BP 03/17/21 1906 126/75     Pulse Rate 03/17/21 1906 95     Resp 03/17/21 1906 18     Temp 03/17/21 1906 98.1 F (36.7 C)     Temp Source 03/17/21 1906 Temporal     SpO2 03/17/21 1906 98 %     Weight --      Height --  Head Circumference --      Peak Johnston --      Pain Score 03/17/21 1907 3     Pain Loc --      Pain Edu? --      Excl. in GC? --    No data found.  Updated Vital Signs BP 126/75 (BP Location: Right Arm)   Pulse 95   Temp 98.1 F (36.7 C) (Temporal)   Resp 18   SpO2 98%   Visual Acuity Right Eye Distance:   Left Eye Distance:   Bilateral Distance:    Right Eye Near:   Left Eye Near:    Bilateral Near:     Physical Exam General Appearance:    Alert, cooperative, no distress  HENT:   ENT exam normal, no neck nodes or sinus tenderness,    leukoplakia oral mucosa   Eyes:    PERRL, conjunctiva/corneas clear, EOM's intact       Lungs:     Clear to auscultation bilaterally, respirations unlabored  Heart:    Regular rate and rhythm  Neurologic:   Awake, alert, oriented x 3. No apparent focal neurological           defect.         UC Treatments / Results  Labs (all labs ordered are listed, but only abnormal results are displayed) Labs Reviewed - No data to display  EKG   Radiology No results found.  Procedures Procedures  (including critical care time)  Medications Ordered in UC Medications - No data to display  Initial Impression / Assessment and Plan / UC Course  I have reviewed the triage vital signs and the nursing notes.  Pertinent labs & imaging results that were available during my care of the patient were reviewed by me and considered in my medical decision making (see chart for details).     Thrush, oral  Treatment per discharge medication. Return precaution given. Final Clinical Impressions(s) / UC Diagnoses   Final diagnoses:  Thrush, oral     Discharge Instructions      Start probiotics (over the counter) any brand to prevent recurrence.      ED Prescriptions     Medication Sig Dispense Auth. Provider   nystatin (MYCOSTATIN) 100000 UNIT/ML suspension Gargle and spit 5 ml 3 times daily as needed for thrush 473 mL Bing Neighbors, FNP      PDMP not reviewed this encounter.   Bing Neighbors, FNP 03/24/21 [phone]

## 2021-03-17 NOTE — Discharge Instructions (Addendum)
Start probiotics (over the counter) any brand to prevent recurrence.

## 2021-05-01 ENCOUNTER — Other Ambulatory Visit: Payer: Self-pay

## 2021-05-01 ENCOUNTER — Ambulatory Visit
Admission: EM | Admit: 2021-05-01 | Discharge: 2021-05-01 | Disposition: A | Discharge status: Home / Self Care | Payer: Medicaid Other | Attending: Internal Medicine | Admitting: Internal Medicine

## 2021-05-01 ENCOUNTER — Encounter: Payer: Self-pay | Admitting: Emergency Medicine

## 2021-05-01 DIAGNOSIS — N760 Acute vaginitis: Secondary | ICD-10-CM | POA: Insufficient documentation

## 2021-05-01 MED ORDER — FLUCONAZOLE 150 MG PO TABS: 150.0000 mg | ORAL_TABLET | Freq: Once | ORAL | 0 refills | Status: AC

## 2021-05-01 NOTE — ED Triage Notes (Signed)
Vaginal itching and chunky white vaginal discharge that started x 2 days ago

## 2021-05-01 NOTE — ED Provider Notes (Signed)
[facility]    CSN: [id number] Arrival date & time: 05/01/21  0848      History   Chief Complaint No chief complaint on file.   HPI Angela Johnston is a 20 y.o. female comes to the urgent care with 2-day history of thick whitish vaginal discharge.  Symptoms started 2 days ago and has been persistent.  No dysuria urgency or frequency.  No dyspareunia. HPI  Past Medical History:  Diagnosis Date   Medical history non-contributory     Patient Active Problem List   Diagnosis Date Noted   Menstrual periods irregular 01/28/2021   Dizziness 01/28/2021   History of postpartum hypertension 09/30/2020   Postpartum exam 09/30/2020   Sickle cell trait (HCC) 07/16/2020   Maternal varicella, non-immune 06/19/2020    Past Surgical History:  Procedure Laterality Date   CESAREAN SECTION N/A 09/21/2020   Procedure: CESAREAN SECTION;  Surgeon: Adam Phenix, MD;  Location: [facility];  Service: Obstetrics;  Laterality: N/A;   NO PAST SURGERIES     WISDOM TOOTH EXTRACTION      OB History     Gravida  1   Para  1   Term  1   Preterm      AB      Living  1      SAB      IAB      Ectopic      Multiple  0   Live Births  1            Home Medications    Prior to Admission medications   Medication Sig Start Date End Date Taking? Authorizing Provider  fluconazole (DIFLUCAN) 150 MG tablet Take 1 tablet (150 mg total) by mouth once for 1 dose. 05/01/21 05/01/21 Yes Cledith Kamiya, Britta Mccreedy, MD  acetaminophen (TYLENOL 8 HOUR) 650 MG CR tablet Take 1 tablet (650 mg total) by mouth every 8 (eight) hours as needed for pain. 09/30/20   Leeroy Bock, MD  ferrous sulfate 325 (65 FE) MG tablet Take 325 mg by mouth every other day.    [provider]  psyllium (METAMUCIL) 28 % packet Take 1 packet by mouth 2 (two) times daily. 07/23/20   Leeroy Bock, MD    Family History Family History  Problem Relation Age of Onset   Healthy Mother    Healthy  Father    Heart murmur Maternal Grandmother     Social History Social History   Tobacco Use   Smoking status: Never   Smokeless tobacco: Never  Vaping Use   Vaping Use: Never used  Substance Use Topics   Alcohol use: Not Currently   Drug use: Not Currently     Allergies   Patient has no known allergies.   Review of Systems Review of Systems  Constitutional: Negative.   Genitourinary:  Positive for vaginal discharge. Negative for dysuria, frequency, pelvic pain, urgency, vaginal bleeding and vaginal pain.    Physical Exam Triage Vital Signs ED Triage Vitals  Enc Vitals Group     BP 05/01/21 0900 131/81     Pulse Rate 05/01/21 0900 85     Resp 05/01/21 0900 16     Temp 05/01/21 0900 98 F (36.7 C)     Temp Source 05/01/21 0900 Oral     SpO2 05/01/21 0900 98 %     Weight --      Height --      Head Circumference --  Peak Flow --      Pain Score 05/01/21 0905 0     Pain Loc --      Pain Edu? --      Excl. in GC? --    No data found.  Updated Vital Signs BP 131/81 (BP Location: Right Arm)   Pulse 85   Temp 98 F (36.7 C) (Oral)   Resp 16   SpO2 98%   Visual Acuity Right Eye Distance:   Left Eye Distance:   Bilateral Distance:    Right Eye Near:   Left Eye Near:    Bilateral Near:     Physical Exam   UC Treatments / Results  Labs (all labs ordered are listed, but only abnormal results are displayed) Labs Reviewed  CERVICOVAGINAL ANCILLARY ONLY    EKG   Radiology No results found.  Procedures Procedures (including critical care time)  Medications Ordered in UC Medications - No data to display  Initial Impression / Assessment and Plan / UC Course  I have reviewed the triage vital signs and the nursing notes.  Pertinent labs & imaging results that were available during my care of the patient were reviewed by me and considered in my medical decision making (see chart for details).     1.  Acute vaginitis: Fluconazole 150 mg x  1 dose to be repeated in 72 hours if symptoms are persistent No concerns for STD Return precautions given.  Final Clinical Impressions(s) / UC Diagnoses   Final diagnoses:  Vaginitis and vulvovaginitis   Discharge Instructions   None    ED Prescriptions     Medication Sig Dispense Auth. Provider   fluconazole (DIFLUCAN) 150 MG tablet Take 1 tablet (150 mg total) by mouth once for 1 dose. 2 tablet [place], Britta Mccreedy, MD      PDMP not reviewed this encounter.   Merrilee Jansky, MD 05/01/21 [phone]

## 2021-05-04 LAB — CERVICOVAGINAL ANCILLARY ONLY
Bacterial Vaginitis (gardnerella): NEGATIVE
Candida Glabrata: NEGATIVE
Candida Vaginitis: POSITIVE — AB
Chlamydia: NEGATIVE
Comment: NEGATIVE
Comment: NEGATIVE
Comment: NEGATIVE
Comment: NEGATIVE
Comment: NEGATIVE
Comment: NORMAL
Neisseria Gonorrhea: NEGATIVE
Trichomonas: NEGATIVE

## 2021-07-12 NOTE — Patient Instructions (Incomplete)
It was wonderful to see you today. ? ?Please bring ALL of your medications with you to every visit.  ? ?Today we talked about: ? ?** ? ? ?Thank you for choosing [facility].  ? ?Please call [phone] with any questions about today's appointment. ? ?Please be sure to schedule follow up at the front  desk before you leave today.  ? ?Husein Guedes, DO ?PGY-2 Family Medicine   ?
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
## 2021-07-13 ENCOUNTER — Ambulatory Visit: Payer: Medicaid Other | Admitting: Family Medicine

## 2021-08-11 ENCOUNTER — Other Ambulatory Visit: Payer: Self-pay

## 2021-08-11 ENCOUNTER — Ambulatory Visit (INDEPENDENT_AMBULATORY_CARE_PROVIDER_SITE_OTHER): Payer: Medicaid Other | Admitting: Family Medicine

## 2021-08-11 ENCOUNTER — Encounter: Payer: Self-pay | Admitting: Family Medicine

## 2021-08-11 VITALS — BP 120/73 | HR 84 | Ht 64.0 in | Wt 218.4 lb

## 2021-08-11 DIAGNOSIS — F39 Unspecified mood [affective] disorder: Secondary | ICD-10-CM | POA: Diagnosis not present

## 2021-08-11 DIAGNOSIS — F430 Acute stress reaction: Secondary | ICD-10-CM | POA: Diagnosis not present

## 2021-08-11 MED ORDER — FLUOXETINE HCL 20 MG PO TABS: 20.0000 mg | ORAL_TABLET | Freq: Every day | ORAL | 3 refills | Status: DC

## 2021-08-11 NOTE — Progress Notes (Signed)
Angela Johnston is alone Sources of clinical information for visit is/are patient. Nursing assessment for this office visit was reviewed with the patient for accuracy and revision.     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 0 0 0 0 No  Number falls in past yr: 0 0 0 0 -  Injury with Fall? 0 0 0 0 -    PHQ9 SCORE ONLY 08/11/2021 01/26/2021 11/06/2020  PHQ-9 Total Score 21 2 1     Adult vaccines due  Topic Date Due   TETANUS/TDAP  07/23/2030    Health Maintenance Due  Topic Date Due   PAP-Cervical Cytology Screening  Never done   PAP SMEAR-Modifier  Never done      History/P.E. limitations: none  Adult vaccines due  Topic Date Due   TETANUS/TDAP  07/23/2030   There are no preventive care reminders to display for this patient.  Health Maintenance Due  Topic Date Due   PAP-Cervical Cytology Screening  Never done   PAP SMEAR-Modifier  Never done     Chief Complaint  Patient presents with   Depression    History of Present Illness:   Associated Signs/Symptoms: Depression Symptoms:  depressed mood, anhedonia, hypersomnia, fatigue, difficulty  concentrating, recurrent thoughts of death, suicidal thoughts without plan, anxiety, loss of energy/fatigue, decreased appetite,   (Hypo) Manic Symptoms:   Denies Pressured speech and Racing thoughts Anxiety Symptoms:   difficulty concentrating No Panic Attacks   Total Time spent with patient:  30 minutes     Past Psychiatric History:  No history of Psychiatric disorder. No history of intentional harmful acts with suicidal intent has had no previous counseling.   Previous antidepressant med therapy: none    Prior Inpatient Therapy: no    Prior Outpatient Therapy: no    Alcohol Screening: Denies use of Alcohol, marijuana and other recreational drugs   Additional Social History: Marital status: single What types of issues is patient dealing with in the relationship?:  Angela Johnston suspects her significant other / FOB of infidelity    Angela Johnston has been sexually active with her significant other

## 2021-08-11 NOTE — Patient Instructions (Signed)
I am very glad you chose to come in today.   I am concerned that you are having a depressed mood. Please start Fluoxetine 20 mg tablet, one tablet each morning.   It will take  a few weeks for it to start working.  It can take 6 to 8 weeks before it has its full effect.   Dr Lewanda Perea would like to see you back in 2 weeks to see how you are doing.    If you are feeling suicidal or depression symptoms worsen please immediately go to:   If you are thinking about harming yourself or having thoughts of suicide, or if you know someone who is, seek help right away. If you are in crisis, make sure you are not left alone.  If someone else is in crisis, make sure he/she/they is not left alone  Call 988 OR 1-800-273-TALK  24 Hour Availability for Walk-IN services  [facility]  [address] [place], [place] [phone] Crisis [phone]    Other crisis resources:  [facility] [employer] (Domestic Violence, Rape & Victim Assistance [phone]  [facility]    (ONLY from 8am-4pm)    [phone]  [facility] Mobile Crisis [facility] (24/7)   [phone]  [place] National Suicide Hotline   [phone] Len Childs)     Psychiatry Resource List (Adults and Children)   Most of these providers will take Medicaid. please consult your insurance for a complete and updated list of available providers. When calling to make an appointment have your insurance information available to confirm you are covered.   [facility] [address] [place] [phone]  [facility]:    [place]: [address]     [phone]  [place]: [address] [place]. [place],        [phone]  [place]: [address],    [phone]  [place]ath[place]ne Sharper: [address],                   [phone]   [facility]  (Psychiatry &  counseling ; adults & children ; will take Medicaid)  [address], [place], [place]        [phone]    [facility]  (Psychiatry only; Adults only, will take Medicaid)  [address], [place], [place] [address]       [phone]    [facility] (Psychiatry & counseling ; adults & children ; will take Medicaid  [address]  [place] [address]    [phone]    (Spanish therapist)   [facility]  Psychiatry & counseling; Adults and children;  [name]    [place], [place] [address]    [phone]   [facility] (Psychiatry & counseling; adults & children; Medicare no Medicaid)  [name]   [place], [place]  [address]      [phone]     [facility] (up to age 16)  Psychiatry & counseling ,will take Medicaid, must do counseling to receive psychiatry services  [address] [place] [address]        [phone]   [facility] (Psychiatry & counseling; adults & children; will take Medicaid)  [address],  [place], [place]  [phone]   [facility]---  Walk-in Mon-Fri, 8:30-5:00 (will take Medicaid)  [address], [place], [place]  [phone]    [facility] --- Walk-In Mon-Friday 8am-3pm ( will take Medicaid, Psychiatry, Adults & children,  [address] [place], [place]   [phone]    [facility] [place]--, Walk-in M-F 8am-12pm and 1pm -3pm    (Counseling, Psychiatry, will take Medicaid, adults & children)   [address] [place], [place]  [phone]

## 2021-08-11 NOTE — Progress Notes (Signed)
Warm hand off initiated per provider and pt request due to phq9 score  S: PT shared with provider that she is currently struggling with break up and having issues trusting. She reports ruminating and feeling overwhelmed by this.  When asked about positive suicidal ideation score on phq9, pt reported they are thoughts of not wanting to be here. Denied plan and intent. Pt reported her daughter is a protective factor.   O: Pt has poor eye contact, pt participates in conversations with prompt replies; oriented to time and place; PHQ9 score of 21  A: Pt is reacting with acute stress to life stressor. Pt experiencing depressive and anxiety syx   P: Pt was give hotline numbers. Pt declined therapy but was interested in medication and will f/u with FM provider.    Angela Asal, PhD., LMFT
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
## 2021-09-01 ENCOUNTER — Other Ambulatory Visit: Payer: Self-pay

## 2021-09-01 ENCOUNTER — Encounter: Payer: Self-pay | Admitting: Family Medicine

## 2021-09-01 ENCOUNTER — Ambulatory Visit (INDEPENDENT_AMBULATORY_CARE_PROVIDER_SITE_OTHER): Payer: Medicaid Other | Admitting: Family Medicine

## 2021-09-01 VITALS — BP 119/73 | HR 84 | Ht 64.0 in | Wt 218.6 lb

## 2021-09-01 DIAGNOSIS — F39 Unspecified mood [affective] disorder: Secondary | ICD-10-CM | POA: Diagnosis not present

## 2021-09-01 DIAGNOSIS — B3731 Acute candidiasis of vulva and vagina: Secondary | ICD-10-CM | POA: Diagnosis not present

## 2021-09-01 MED ORDER — FLUCONAZOLE 150 MG PO TABS: 150.0000 mg | ORAL_TABLET | Freq: Once | ORAL | 0 refills | Status: AC

## 2021-09-01 MED ORDER — FLUOXETINE HCL 40 MG PO CAPS: 40.0000 mg | ORAL_CAPSULE | Freq: Every day | ORAL | 2 refills | Status: DC

## 2021-09-01 NOTE — Assessment & Plan Note (Signed)
Established problem that has improved.   Angela Johnston's mood is somewhat better PHQ9 SCORE ONLY 09/01/2021 08/11/2021 01/26/2021  PHQ-9 Total Score 9 21 2   While she indicated a 1 on Q(, she said she only occasionally thinks about death, but has no intention or plan to harm herself She is taking fluoxetine 20 mg daily for 4 weeks.  She is taking it most of the time.  She did not try to make appointment with a counselor. She feels her level of enrgy remains low.  Her relation with her significant other/FOB is improved.  She is starting a new job Architectural technologist at [employer]  She believes that she has many negative thoughts, but is able to challenge them to see if they are true.    A/ Mood Disorder, improved, but persistent fatigue  P/ Increase Fluoxetine to 40 mg daily Encouraged Angela Escher to establish relation with a counselor, perhaps one with experience with CBT as Angela Clopton already recognizes challenging her negative thoughts is helpful.   RETURN TO CLINIC 4 weeks to assess symptoms and tolerance of increased fluoxetine dose.

## 2021-09-01 NOTE — Progress Notes (Signed)
Angela Johnston is alone Sources of clinical information for visit is/are patient. Nursing assessment for this office visit was reviewed with the patient for accuracy and revision.     Previous Report(s) Reviewed: none  Depression screen PHQ 2/9 09/01/2021  Decreased Interest 1  Down, Depressed, Hopeless 1  PHQ - 2 Score 2  Altered sleeping 1  Tired, decreased energy 2  Change in appetite 0  Feeling bad or failure about yourself  2  Trouble concentrating 1  Moving slowly or fidgety/restless 0  Suicidal thoughts 1  PHQ-9 Score 9  Difficult doing work/chores -  Some recent data might be hidden   [name] Visit from 09/01/2021 in [place] Office Visit from 08/11/2021 in [place] Office Visit from 01/26/2021 in [place]  Thoughts that you would be better off dead, or of hurting yourself in some way Several days Nearly every day Not at all  PHQ-9 Total Score 9 21 2        Fall Risk  09/30/2020 07/10/2020 03/05/2020 08/29/2019 02/10/2018  Falls in the past year? 0 0 0 0 No  Number falls in past yr: 0 0 0 0 -  Injury with Fall? 0 0 0 0 -    PHQ9 SCORE ONLY 09/01/2021 08/11/2021 01/26/2021  PHQ-9 Total Score 9 21 2     Adult vaccines due  Topic Date Due   TETANUS/TDAP  07/23/2030    Health Maintenance Due  Topic Date Due   PAP-Cervical Cytology Screening  Never done   PAP SMEAR-Modifier  Never done      History/P.E. limitations: none  Adult vaccines due  Topic Date Due   TETANUS/TDAP  07/23/2030   There are no preventive care reminders to display for this patient.  Health Maintenance Due  Topic Date Due   PAP-Cervical Cytology Screening  Never done   PAP SMEAR-Modifier  Never done     Chief Complaint  Patient presents with   Mood f/u

## 2021-09-01 NOTE — Patient Instructions (Addendum)
I am glad you came back for a visit.  You do not look as sad as you did last visit.  I am glad.  Because you still are having low energy, I would like you to try a higher dose of Fluoxetine (antidepressant).  It will be 40 mg capsule.  Take one 40 mg capsule each morning.    Dr Camren Henthorn would like to see you again in 4 weeks to see how you are doing.    I believe you may do well with a therapay called Cognitive Behavioral Therapy (CBT).  This therapy helps you find you negative thoughts, then challenge them to see if they are true.  It sounds like this is something you are already soing on your own, but it can be helpful to have a counselor help you with it.   Take one tablet of Fluconazole (Diflucan) to treat your yeast vaginitis.       Therapy and Counseling Resources Most providers on this list will take Medicaid.   YOu may want to go to the Psychology Today website.  The site has a Event organiser that lets you filter the kind of counselor you want.  You can also add to the filter your insurance, to see if the counselor takes it.  You may want to filter for Cognitive Behavioral Therapy to find a counselor who uses this therapy with people.     [facility] [address], [place], [place] [address]       [phone]      [facility] [address] [place], [place]     Jinny Blossom [facility] [address], [place], [place]   [facility]:  [place]. [place]   [facility]:  [place] [address] Loni Muse, [place]   [facility] (under & uninsured) [address] [place] [phone]    [email or web address]     [facility] [place]     Phone:  [phone]     [place]  [place] [address]      [place], [place] ext [place]: [place], [place], [place]    [place] ([place] therapist) [address] [place] [address]   [place] [address]    [phone]     [facility]   [place],  [place], [place]    [place]  [place]   [facility]  [address] [place], [place]        [phone]   Open Access/Walk In Clinic under & uninsured [employer],  [address], [place] [phone]):  Mon - Fri from 8 AM - 3 PM   [facility] [place],  ([place])   [place], [place]: [phone]) 8:30 - 12; 1 - 2:30   [facility] the [place],  [place], [place]    ([phone]):8:30 - 12; 2 - 3PM   [facility] [employer],  [address],  [place]; [phone]):   Mon - Fri 8 AM - 5 PM   [facility] [place]  [place]  MWF 12:30 to 3:00 or call to schedule an appointment  [phone]   Specific Provider options Psychology Today  https://www.psychologytoday.com/us click on find a therapist  enter your zip code left side and select or tailor a therapist for your specific need.    [facility] Provider Directory [email or web address]  (Medicaid)   Follow all drop down to find a provider   [place] or [email or web address] [address], [place], [place] Recovery support and educational    In home counseling [place] Telephone: [phone]  office in [place] [email or web address]   Does not take reg. Medicaid or Medicare private insurance [facility], [place] health Choice, [place], [place], [place], [place], [place] Availability:  [place]  or [phone]   [facility] Service of the [facility] [phone]   [facility] Crisis Service  [phone]    [facility]  [phone] (after hours)   Therapeutic Alternative/Mobile Crisis   [phone]   [place] National Suicide Hotline  3675885026 Diamantina Monks)   Call 911 or go to emergency room   [facility]  [phone]);  [place] and [employer]  [phone]); [place], [place], [place], [place], [place], [place], [place]       Vaginal Yeast Infection, Adult Vaginal yeast infection is a condition that causes vaginal discharge as well as soreness, swelling, and redness (inflammation) of the vagina. This is a common condition. Some women get this infection frequently. What are the causes? This condition is caused by a change in the normal balance of the yeast (Candida) and normal bacteria that live in the vagina. This change causes an overgrowth of yeast, which causes the inflammation. What increases the risk? The condition is more likely to develop in women who: Take antibiotic medicines. Have diabetes. Take birth control pills. Are pregnant. Douche often. Have a weak body defense system (immune system). Have been taking steroid medicines for a long time. Frequently wear tight clothing. What are the signs or symptoms? Symptoms of this condition include: White, thick, creamy vaginal discharge. Swelling, itching, redness, and irritation of the vagina. The lips of the vagina (labia) may be affected as well. Pain or a burning feeling while urinating. Pain during sex. How is this diagnosed? This condition is diagnosed based on: Your medical history. A physical exam. A pelvic exam. Your health care provider will examine a sample of your vaginal discharge under a microscope. Your health care provider may send this sample for testing to confirm the diagnosis. How is this treated? This condition is treated with  medicine. Medicines may be over-the-counter or prescription. You may be told to use one or more of the following: Medicine that is taken by mouth (orally). Medicine that is applied as a cream (topically). Medicine that is inserted directly into the vagina (suppository). Follow these instructions at home: Take or apply over-the-counter and prescription medicines only as told by your health care provider. Do not use tampons until your health care provider approves. Do not have sex until your infection has cleared. Sex can prolong or worsen your symptoms of infection. Ask your health care provider when it is safe to resume sexual activity. Keep all follow-up visits. This is important. How is this prevented?  Do not wear tight clothes, such as pantyhose or tight pants. Wear breathable cotton underwear. Do not use douches, perfumed soap, creams, or powders. Wipe from front to back  after using the toilet. If you have diabetes, keep your blood sugar levels under control. Ask your health care provider for other ways to prevent yeast infections. Contact a health care provider if: You have a fever. Your symptoms go away and then return. Your symptoms do not get better with treatment. Your symptoms get worse. You have new symptoms. You develop blisters in or around your vagina. You have blood coming from your vagina and it is not your menstrual period. You develop pain in your abdomen. Summary Vaginal yeast infection is a condition that causes discharge as well as soreness, swelling, and redness (inflammation) of the vagina. This condition is treated with medicine. Medicines may be over-the-counter or prescription. Take or apply over-the-counter and prescription medicines only as told by your health care provider. Do not douche. Resume sexual activity or use of tampons as instructed by your health care provider. Contact a health care provider if your symptoms do not get better with treatment or  your symptoms go away and then return. This information is not intended to replace advice given to you by your health care provider. Make sure you discuss any questions you have with your health care provider. Document Revised: 10/06/2020 Document Reviewed: 10/06/2020 Elsevier Patient Education  [place] with the new job!

## 2021-09-01 NOTE — Assessment & Plan Note (Signed)
New diagnosis Found on wet prep last visit last office visit Rx diflucan 150 mg pO x 1

## 2021-09-10 ENCOUNTER — Ambulatory Visit: Payer: Self-pay

## 2021-09-10 NOTE — Telephone Encounter (Signed)
°  Chief Complaint: abdominal cramping Symptoms: abdominal cramps, low back pain, vaginal bleeding light flow Frequency: 1 day Pertinent Negatives: Patient denies nausea Disposition: [] ED /[] Urgent Care (no appt availability in office) / [] Appointment(In office/virtual)/ []  [facility]/ [] Home Care/ [] Refused Recommended Disposition /[] [place] Mobile Bus/ [x]  Follow-up with PCP Additional Notes: pt states she nexplanon and hasnt had period in several years. Advised her to f/up with pcp in the morning and sypmtoms would be ok to monitor at home   Reason for Disposition  [1] MODERATE pain (e.g., cramps interfere with normal activities) AND [2] longstanding (chronic) problem AND [3] has not been evaluated for this by a physician (or NP/PA)  Answer Assessment - Initial Assessment Questions 1. LOCATION: "Where does it hurt?"      Lower stomach  2. ONSET: "When did this episode of pain begin?"       Yesterday  3. SEVERITY: "How bad is the pain?" "Are you missing school or work because of the pain?"  (e.g., Scale 1-10; mild, moderate, or severe)   - MILD (1-3): doesn't interfere with normal activities, lasting 1-2 days    - MODERATE (4-7): interferes with normal activities (missing work or school), lasting 2-3 days, some associated GI symptoms    - SEVERE (8-10): excruciating pain, lasting 2-7 days, associated GI symptoms, pain radiating into thighs and back     4 4. VAGINAL BLEEDING: "Describe the bleeding that you are having." "How much bleeding is there?"    - SPOTTING: spotting, or pinkish / brownish mucous discharge; does not fill panty liner or pad    - MILD:  less than 1 pad / hour; less than patient's usual menstrual bleeding   - MODERATE: 1-2 pads / hour; 1 menstrual cup every 6 hours; small-medium blood clots (e.g., pea, grape, small coin)   - SEVERE: soaking 2 or more pads/hour for 2 or more hours; 1 menstrual cup every 2 hours; bleeding not contained by pads or  continuous red blood from vagina; large blood clots (e.g., golf ball, large coin)      mild 5. MENSTRUAL HISTORY:  "When did this menstrual period begin?", "Is this a normal period for you?"       Been a long time 6. LMP:  "When did your last menstrual period begin?"     unsure 7. OTHER SYMPTOMS: "Do you have any other symptoms?" (e.g., back pain, diarrhea, dizzy or lightheaded, fever, urination pain, vaginal discharge, vomiting)     Back pain 8. PREGNANCY: "Is there any chance you are pregnant?" (e.g., unprotected intercourse, missed birth control pill, broken condom)     No  Protocols used: Abdominal Pain - Menstrual Cramps-A-AH

## 2021-10-15 ENCOUNTER — Ambulatory Visit: Payer: Medicaid Other | Admitting: Family Medicine
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
## 2021-12-10 ENCOUNTER — Ambulatory Visit: Payer: Medicaid Other | Admitting: Family Medicine
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
## 2022-01-05 ENCOUNTER — Encounter: Payer: Self-pay | Admitting: Family Medicine

## 2022-01-05 ENCOUNTER — Encounter: Payer: Self-pay | Admitting: *Deleted

## 2022-01-05 ENCOUNTER — Ambulatory Visit: Payer: Medicaid Other | Admitting: Family Medicine

## 2022-01-10 NOTE — Progress Notes (Signed)
    SUBJECTIVE:   Chief compliant/HPI: annual examination  Angela Johnston is a 21 y.o. who presents today to discuss contraception options. States she has a Nexplanon that was placed last year. She feels like it has made her gain a lot of weight, sleeps a lot and she hasn't had a cycle in a long time. She is wondering what other options may be available.  She has tried OCPs in the past, not interested in going back.    OBJECTIVE:   BP 110/64   Pulse 96   Wt 216 lb 9.6 oz (98.2 kg)   SpO2 99%   BMI 37.18 kg/m   General: Awake, alert, oriented, in no acute distress, pleasant and cooperative with examination Respiratory: Normal respiratory effort Psych: Normal mood and affect  ASSESSMENT/PLAN:   General counselling and advice on contraception Reviewed several forms of birth control options available including hormonal contraceptive medication including pill, patch,  injection,contraceptive implant; hormonal and nonhormonal IUDs. Risks and benefits reviewed.  Questions were answered. She would like to continue with Nexplanon now that she knows that it is normal to have cycle irregularity or no periods with it. She is considering IUD in the future. Does not desire any option that would contribute to weight loss (recommended against Depo). Does not feel like OCP would be a good option since she has a little daughter, would be difficult to remember. Information was given to patient to review.   Healthcare maintenance She is due for Pap smear.  Offered to do today, however patient declined.  Encouraged her to schedule an appointment for Pap smear.    Sabino Dick, DO [place] [facility]

## 2022-01-15 ENCOUNTER — Ambulatory Visit (INDEPENDENT_AMBULATORY_CARE_PROVIDER_SITE_OTHER): Payer: Medicaid Other | Admitting: Family Medicine

## 2022-01-15 ENCOUNTER — Other Ambulatory Visit: Payer: Self-pay

## 2022-01-15 ENCOUNTER — Encounter: Payer: Self-pay | Admitting: Family Medicine

## 2022-01-15 DIAGNOSIS — Z3009 Encounter for other general counseling and advice on contraception: Secondary | ICD-10-CM | POA: Insufficient documentation

## 2022-01-15 DIAGNOSIS — Z0000 Encounter for general adult medical examination without abnormal findings: Secondary | ICD-10-CM | POA: Diagnosis not present

## 2022-01-15 NOTE — Assessment & Plan Note (Signed)
She is due for Pap smear.  Offered to do today, however patient declined.  Encouraged her to schedule an appointment for Pap smear.

## 2022-01-15 NOTE — Patient Instructions (Addendum)
It was wonderful to see you today.  Please bring ALL of your medications with you to every visit.   Today we talked about:  -We talked about different birth control options. It sounds like you would like to continue with the Nexplanon for now, it is good for 3 years total. Should be replaced by April 2025.  --You are overdue for a Pap smear. This is a screening test to check for signs of cervical cancer. Please schedule and appointment to return for this at your earliest convenience.    Thank you for choosing [facility].   Please call [phone] with any questions about today's appointment.  Please be sure to schedule follow up at the front  desk before you leave today.   Sabino Dick, DO PGY-2 Family Medicine    Contraception Choices Contraception, also called birth control, refers to methods or devices that prevent pregnancy. Hormonal methods  Contraceptive implant A contraceptive implant is a thin, plastic tube that contains a hormone that prevents pregnancy. It is different from an intrauterine device (IUD). It is inserted into the upper part of the arm by a health care provider. Implants can be effective for up to 3 years. Progestin-only injections Progestin-only injections are injections of progestin, a synthetic form of the hormone progesterone. They are given every 3 months by a health care provider. Birth control pills Birth control pills are pills that contain hormones that prevent pregnancy. They must be taken once a day, preferably at the same time each day. A prescription is needed to use this method of contraception. Birth control patch The birth control patch contains hormones that prevent pregnancy. It is placed on the skin and must be changed once a week for three weeks and removed on the fourth week. A prescription is needed to use this method of contraception. Vaginal ring A vaginal ring contains hormones that prevent pregnancy. It is placed  in the vagina for three weeks and removed on the fourth week. After that, the process is repeated with a new ring. A prescription is needed to use this method of contraception. Emergency contraceptive Emergency contraceptives prevent pregnancy after unprotected sex. They come in pill form and can be taken up to 5 days after sex. They work best the sooner they are taken after having sex. Most emergency contraceptives are available without a prescription. This method should not be used as your only form of birth control. Barrier methods  Female condom A female condom is a thin sheath that is worn over the penis during sex. Condoms keep sperm from going inside a woman's body. They can be used with a sperm-killing substance (spermicide) to increase their effectiveness. They should be thrown away after one use. Female condom A female condom is a soft, loose-fitting sheath that is put into the vagina before sex. The condom keeps sperm from going inside a woman's body. They should be thrown away after one use. Diaphragm A diaphragm is a soft, dome-shaped barrier. It is inserted into the vagina before sex, along with a spermicide. The diaphragm blocks sperm from entering the uterus, and the spermicide kills sperm. A diaphragm should be left in the vagina for 6-8 hours after sex and removed within 24 hours. A diaphragm is prescribed and fitted by a health care provider. A diaphragm should be replaced every 1-2 years, after giving birth, after gaining more than 15 lb (6.8 kg), and after pelvic surgery. Cervical cap A cervical cap is a round, soft latex or  plastic cup that fits over the cervix. It is inserted into the vagina before sex, along with spermicide. It blocks sperm from entering the uterus. The cap should be left in place for 6-8 hours after sex and removed within 48 hours. A cervical cap must be prescribed and fitted by a health care provider. It should be replaced every 2 years. Sponge A sponge is a  soft, circular piece of polyurethane foam with spermicide in it. The sponge helps block sperm from entering the uterus, and the spermicide kills sperm. To use it, you make it wet and then insert it into the vagina. It should be inserted before sex, left in for at least 6 hours after sex, and removed and thrown away within 30 hours. Spermicides Spermicides are chemicals that kill or block sperm from entering the cervix and uterus. They can come as a cream, jelly, suppository, foam, or tablet. A spermicide should be inserted into the vagina with an applicator at least 10-15 minutes before sex to allow time for it to work. The process must be repeated every time you have sex. Spermicides do not require a prescription. Intrauterine contraception Intrauterine device (IUD) An IUD is a T-shaped device that is put in a woman's uterus. There are two types: Hormone IUD.This type contains progestin, a synthetic form of the hormone progesterone. This type can stay in place for 3-5 years. Copper IUD.This type is wrapped in copper wire. It can stay in place for 10 years. Permanent methods of contraception Female tubal ligation In this method, a woman's fallopian tubes are sealed, tied, or blocked during surgery to prevent eggs from traveling to the uterus. Hysteroscopic sterilization In this method, a small, flexible insert is placed into each fallopian tube. The inserts cause scar tissue to form in the fallopian tubes and block them, so sperm cannot reach an egg. The procedure takes about 3 months to be effective. Another form of birth control must be used during those 3 months. Female sterilization This is a procedure to tie off the tubes that carry sperm (vasectomy). After the procedure, the man can still ejaculate fluid (semen). Another form of birth control must be used for 3 months after the procedure. Natural planning methods Natural family planning In this method, a couple does not have sex on days when the  woman could become pregnant. Calendar method In this method, the woman keeps track of the length of each menstrual cycle, identifies the days when pregnancy can happen, and does not have sex on those days. Ovulation method In this method, a couple avoids sex during ovulation. Symptothermal method This method involves not having sex during ovulation. The woman typically checks for ovulation by watching changes in her temperature and in the consistency of cervical mucus. Post-ovulation method In this method, a couple waits to have sex until after ovulation. Where to find more information Centers for Disease Control and Prevention: FootballExhibition.com.br Summary Contraception, also called birth control, refers to methods or devices that prevent pregnancy. Hormonal methods of contraception include implants, injections, pills, patches, vaginal rings, and emergency contraceptives. Barrier methods of contraception can include female condoms, female condoms, diaphragms, cervical caps, sponges, and spermicides. There are two types of IUDs (intrauterine devices). An IUD can be put in a woman's uterus to prevent pregnancy for 3-5 years. Permanent sterilization can be done through a procedure for males and females. Natural family planning methods involve nothaving sex on days when the woman could become pregnant. This information is not intended to replace  advice given to you by your health care provider. Make sure you discuss any questions you have with your health care provider. Document Revised: 12/24/2019 Document Reviewed: 12/24/2019 Elsevier Patient Education  2023 [employer].

## 2022-01-15 NOTE — Assessment & Plan Note (Signed)
Reviewed several forms of birth control options available including hormonal contraceptive medication including pill, patch,  injection,contraceptive implant; hormonal and nonhormonal IUDs. Risks and benefits reviewed.  Questions were answered. She would like to continue with Nexplanon now that she knows that it is normal to have cycle irregularity or no periods with it. She is considering IUD in the future. Does not desire any option that would contribute to weight loss (recommended against Depo). Does not feel like OCP would be a good option since she has a little daughter, would be difficult to remember. Information was given to patient to review.

## 2022-02-04 ENCOUNTER — Ambulatory Visit: Payer: Medicaid Other | Admitting: Family Medicine

## 2022-02-05 ENCOUNTER — Encounter: Payer: Self-pay | Admitting: Family Medicine

## 2022-02-05 ENCOUNTER — Ambulatory Visit (INDEPENDENT_AMBULATORY_CARE_PROVIDER_SITE_OTHER): Payer: Medicaid Other | Admitting: Family Medicine

## 2022-02-05 VITALS — BP 104/60 | HR 69 | Ht 64.0 in | Wt 216.0 lb

## 2022-02-05 DIAGNOSIS — Z3200 Encounter for pregnancy test, result unknown: Secondary | ICD-10-CM | POA: Insufficient documentation

## 2022-02-05 LAB — POCT URINE PREGNANCY: Preg Test, Ur: NEGATIVE

## 2022-02-05 NOTE — Progress Notes (Unsigned)
    SUBJECTIVE:   CHIEF COMPLAINT / HPI: Concern for pregnancy  Patient presents for pregnancy test.  She states that she has been having nausea, increased sleepiness and feels as if she has been gaining weight.  Patient has amenorrhea for the last year.  She uses Nexplanon for contraception.  She states that this has been present for close to 2 years.  She states that she has not had menses on a regular basis even when she was on her prior method of birth control. She states that she is sexually active and does not use barrier contraception.  PERTINENT  PMH / PSH:  Nexplanon contraception   OBJECTIVE:   BP 104/60   Pulse 69   Ht 5\' 4"  (1.626 m)   Wt 216 lb (98 kg)   LMP  (LMP Unknown)   SpO2 98%   Breastfeeding No   BMI 37.08 kg/m   Physical Exam Constitutional:      General: She is not in acute distress.    Appearance: She is obese. She is not ill-appearing.  Eyes:     General: No scleral icterus. Cardiovascular:     Rate and Rhythm: Normal rate and regular rhythm.  Pulmonary:     Effort: Pulmonary effort is normal.     Breath sounds: No wheezing, rhonchi or rales.  Abdominal:     General: Bowel sounds are normal. There is no distension.     Palpations: Abdomen is soft.     Tenderness: There is no abdominal tenderness.  Musculoskeletal:     Cervical back: Normal range of motion.  Skin:    General: Skin is warm.  Neurological:     General: No focal deficit present.     Mental Status: She is alert and oriented to person, place, and time.      ASSESSMENT/PLAN:   Possible pregnancy, not yet confirmed Urine pregnancy test completed today Urine pregnancy test negative Recommended barrier contraception with intercourse Offered condoms, patient declines     , MD [facility]

## 2022-02-05 NOTE — Patient Instructions (Addendum)
Your pregnancy test was negative today.  I recommend using barrier contraception such as a condom when you have intercourse.  Safe Sex Practicing safe sex means taking steps before and during sex to reduce your risk of: Getting an STI (sexually transmitted infection). Giving your partner an STI. Unwanted or unplanned pregnancy. How to practice safe sex Ways you can practice safe sex  Limit your sexual partners to only one partner who is having sex with only you. Avoid using alcohol and drugs before having sex. Alcohol and drugs can affect your judgment. Before having sex with a new partner: Talk to your partner about past partners, past STIs, and drug use. Get screened for STIs and discuss the results with your partner. Ask your partner to get screened too. Check your body regularly for sores, blisters, rashes, or unusual discharge. If you notice any of these problems, visit your health care provider. Avoid sexual contact if you have symptoms of an infection or you are being treated for an STI. While having sex, use a condom. Make sure to: Use a condom every time you have vaginal, oral, or anal sex. Both females and males should wear condoms during oral sex. Keep condoms in place from the beginning to the end of sexual activity. Use a latex condom, if possible. Latex condoms offer the best protection. Use only water-based lubricants with a condom. Using petroleum-based lubricants or oils will weaken the condom and increase the chance that it will break. Ways your health care provider can help you practice safe sex  See your health care provider for regular screenings, exams, and tests for STIs. Talk with your health care provider about what kind of birth control (contraception) is best for you. Get vaccinated against hepatitis B and human papillomavirus (HPV). If you are at risk of being infected with HIV (human immunodeficiency virus), talk with your health care provider about taking a  prescription medicine to prevent HIV infection. You are at risk for HIV if you: Are a man who has sex with other men. Are sexually active with more than one partner. Take drugs by injection. Have a sex partner who has HIV. Have unprotected sex. Have sex with someone who has sex with both men and women. Have had an STI. Follow these instructions at home: Take over-the-counter and prescription medicines only as told by your health care provider. Keep all follow-up visits. This is important. Where to find more information Centers for Disease Control and Prevention: FootballExhibition.com.br Planned Parenthood: www.plannedparenthood.org Office on [employer] Health: http://hoffman.com/ Summary Practicing safe sex means taking steps before and during sex to reduce your risk getting an STI, giving your partner an STI, and having an unwanted or unplanned pregnancy. Before having sex with a new partner, talk to your partner about past partners, past STIs, and drug use. Use a condom every time you have vaginal, oral, or anal sex. Both females and males should wear condoms during oral sex. Check your body regularly for sores, blisters, rashes, or unusual discharge. If you notice any of these problems, visit your health care provider. See your health care provider for regular screenings, exams, and tests for STIs. This information is not intended to replace advice given to you by your health care provider. Make sure you discuss any questions you have with your health care provider. Document Revised: 12/24/2019 Document Reviewed: 12/24/2019 Elsevier Patient Education  2023 [employer].

## 2022-02-05 NOTE — Assessment & Plan Note (Signed)
Urine pregnancy test completed today Urine pregnancy test negative Recommended barrier contraception with intercourse Offered condoms, patient declines

## 2022-02-25 ENCOUNTER — Ambulatory Visit: Payer: Medicaid Other

## 2022-03-10 ENCOUNTER — Other Ambulatory Visit: Payer: Self-pay | Admitting: Family Medicine

## 2022-03-10 DIAGNOSIS — F39 Unspecified mood [affective] disorder: Secondary | ICD-10-CM

## 2022-03-23 ENCOUNTER — Ambulatory Visit: Payer: Medicaid Other | Admitting: Student

## 2022-04-01 NOTE — Progress Notes (Deleted)
    SUBJECTIVE:   CHIEF COMPLAINT / HPI:   AUB with Nexplanon Angela Johnston is a 21 y.o. female who presents to the clinic today to discuss abnormal uterine bleeding with her Nexplanon.  Her Nexplanon was inserted 11/20/2020.  Health Maintenance Due for Pap smear.   PERTINENT  PMH / PSH:  Past Medical History:  Diagnosis Date   History of postpartum hypertension 09/30/2020   Maternal varicella, non-immune 06/19/2020   Medical history non-contributory    Sickle cell trait (HCC) 07/16/2020   Noted on Hgb electrophoresis this pregnancy   OBJECTIVE:   There were no vitals taken for this visit. ***  General: NAD, pleasant, able to participate in exam Respiratory: CTAB, normal effort, No wheezes, rales or rhonchi Extremities: no edema or cyanosis. Psych: Normal affect and mood  ASSESSMENT/PLAN:   No problem-specific Assessment & Plan notes found for this encounter.     Sabino Dick, DO [facility]

## 2022-04-02 ENCOUNTER — Ambulatory Visit: Payer: Medicaid Other | Admitting: Family Medicine

## 2022-05-05 DIAGNOSIS — M5459 Other low back pain: Secondary | ICD-10-CM | POA: Diagnosis not present

## 2022-05-27 ENCOUNTER — Ambulatory Visit (INDEPENDENT_AMBULATORY_CARE_PROVIDER_SITE_OTHER): Payer: Medicaid Other | Admitting: Student

## 2022-05-27 VITALS — BP 122/76 | HR 100 | Ht 63.0 in | Wt 219.8 lb

## 2022-05-27 DIAGNOSIS — Z3046 Encounter for surveillance of implantable subdermal contraceptive: Secondary | ICD-10-CM | POA: Diagnosis not present

## 2022-05-27 DIAGNOSIS — R5383 Other fatigue: Secondary | ICD-10-CM | POA: Diagnosis not present

## 2022-05-27 DIAGNOSIS — F39 Unspecified mood [affective] disorder: Secondary | ICD-10-CM

## 2022-05-27 LAB — POCT HEMOGLOBIN: Hemoglobin: 11.1 g/dL (ref 11–14.6)

## 2022-05-27 MED ORDER — FLUOXETINE HCL 40 MG PO CAPS: 40.0000 mg | ORAL_CAPSULE | Freq: Every day | ORAL | 2 refills | Status: DC

## 2022-05-27 NOTE — Patient Instructions (Addendum)
Your NEXPLANON implant was just removed wear your top bandage for 24 to 48 hours. The compression bandage helps minimizes bruising. Keep your implant site covered 3-5 Days with sterile strips While the insertion site is healing, keep the area covered with a smaller bandage. You may become pregnant as early as a week after the removal of NEXPLANON. After NEXPLANON is removed, and if you do not wish to get pregnant at this time, you should start another birth control method, such as condoms, right away. If you want to get pregnant, please consult with your doctor.

## 2022-05-27 NOTE — Progress Notes (Signed)
GYNECOLOGY PROCEDURE NOTE  Angela Johnston is a 21 y.o. G1P1001 here for Nexplanon removal. No other gynecologic concerns.  Nexplanon Removal Patient identified, informed consent performed, consent signed.   Appropriate time out taken. Nexplanon site identified.  Area prepped in usual sterile fashon. 65ml of 1% lidocaine with epinephrine was used to anesthetize the area at the distal end of the implant. A small stab incision was made right beside the implant on the distal portion.  The Nexplanon rod was grasped using hemostats and removed without difficulty.  There was minimal blood loss. There were no complications.  A pressure bandage was applied to reduce any bruising. The patient tolerated the procedure well and was given post procedure instructions.  Patient is planning to use abstinence for contraception and consider OCPs in the future prior to becoming sexually active.  Fatigue Chart reviewed shows evidence of anemia. Obtain POC Hgb today. Consider oral iron supplement every other day.   Mood disorder (HCC) -     FLUoxetine HCl; Take 1 capsule (40 mg total) by mouth daily.  Dispense: 30 capsule; Refill: [place], DO 05/27/2022, 11:30 AM PGY-2, [place]

## 2022-06-02 ENCOUNTER — Ambulatory Visit (HOSPITAL_BASED_OUTPATIENT_CLINIC_OR_DEPARTMENT_OTHER): Payer: Medicaid Other | Attending: Neurosurgery | Admitting: Physical Therapy

## 2022-06-02 ENCOUNTER — Telehealth (HOSPITAL_BASED_OUTPATIENT_CLINIC_OR_DEPARTMENT_OTHER): Payer: Self-pay | Admitting: Physical Therapy

## 2022-06-02 NOTE — Telephone Encounter (Signed)
Spoke with patient regarding missed 9:30 appointment today. Pt states she called yesterday about rescheduling and nobody answered but did not leave a voicemail. Rescheduled the appointment to a later date.  Coltyn Hanning C. Derica Leiber PT, DPT 06/02/22 10:46 AM

## 2022-06-17 ENCOUNTER — Ambulatory Visit (HOSPITAL_BASED_OUTPATIENT_CLINIC_OR_DEPARTMENT_OTHER): Payer: Medicaid Other | Admitting: Physical Therapy

## 2022-06-28 ENCOUNTER — Ambulatory Visit: Payer: Medicaid Other | Admitting: Family Medicine

## 2022-06-28 NOTE — Progress Notes (Deleted)
    SUBJECTIVE:   CHIEF COMPLAINT / HPI:   Angela Johnston is a 21 y.o. female who presents to the [facility] clinic today to discuss the following concerns:   Contraceptive Counseling  Angela Johnston is a 21 y.o. female who presents to the clinic today to discuss contraception options. She has previously tried  ***. Last menstrual period was ***. Her periods are typically *** and last *** days. She denies*** painful cramps with menstruation. She denies history of blood clotting disorders, previous blood clots, migraine with aura, smoking. No personal or family history of breast cancer.   PERTINENT  PMH / PSH: ***  OBJECTIVE:   There were no vitals taken for this visit. ***  General: NAD, pleasant, able to participate in exam Respiratory: normal effort Psych: Normal affect and mood  ASSESSMENT/PLAN:   No problem-specific Assessment & Plan notes found for this encounter.   Reviewed several forms of birth control options available including abstinence, over the counter/barrier methods; hormonal contraceptive medication including pill, patch, ring, injection,contraceptive implant; hormonal and nonhormonal IUDs. Risks and benefits reviewed.  Questions were answered.  Information was given to patient to review.   Sabino Dick, DO [facility]

## 2022-07-19 DIAGNOSIS — R8281 Pyuria: Secondary | ICD-10-CM | POA: Diagnosis not present

## 2022-07-19 DIAGNOSIS — Z3202 Encounter for pregnancy test, result negative: Secondary | ICD-10-CM | POA: Diagnosis not present

## 2022-07-20 NOTE — Progress Notes (Deleted)
    SUBJECTIVE:   CHIEF COMPLAINT / HPI:   Contraceptive Counseling  Angela Johnston is a 21 y.o. female who presents to the clinic today to discuss contraception options. She has previously tried  ***. Last menstrual period was ***. Her periods are typically *** and last *** days. She denies*** painful cramps with menstruation. She denies history of blood clotting disorders, previous blood clots, migraine with aura, smoking. No personal or family history of breast cancer.   PERTINENT  PMH / PSH: ***  OBJECTIVE:   There were no vitals taken for this visit.   General: NAD, pleasant, able to participate in exam Cardiac: RRR, no murmurs. Respiratory: CTAB, normal effort, No wheezes, rales or rhonchi Abdomen: Bowel sounds present, nontender, nondistended, no hepatosplenomegaly. Extremities: no edema or cyanosis. Skin: warm and dry, no rashes noted Neuro: alert, no obvious focal deficits Psych: Normal affect and mood  ASSESSMENT/PLAN:   No problem-specific Assessment & Plan notes found for this encounter.   Reviewed several different forms of birth control options available including abstinence; over the counter/barrier methods; hormonal contraceptive medication including pill, patch, ring, injection,contraceptive implant; hormonal and nonhormonal IUDs. Risks and benefits reviewed.  Questions were answered.  Information was given to patient to review.   Sabino Dick, DO [place] [facility]

## 2022-07-20 NOTE — Patient Instructions (Incomplete)
It was wonderful to see you today.  Please bring ALL of your medications with you to every visit.   Today we talked about:  Contraception Choices Contraception refers to things you do or use to prevent pregnancy. It is also called birth control. There are several methods of birth control. Talk to your doctor about the best method for you. Hormonal birth control This kind of birth control uses hormones. Here are some types of hormonal birth control: A tube that is put under the skin of your arm (implant). The tube can stay in for up to 3 years. Shots you get every 3 months. Pills you take every day. A patch you change 1 time each week for 3 weeks. After that, the patch is taken off for 1 week. A ring you put in the vagina. The ring is left in for 3 weeks. Then it is taken out of the vagina for 1 week. Then a new ring is put in. Pills you take after unprotected sex. These are called emergency birth control pills. Barrier birth control Here are some types of barrier birth control: A thin covering that is put on the penis before sex (female condom). The covering is thrown away after sex. A soft, loose covering that is put in the vagina before sex (female condom). The covering is thrown away after sex. A rubber bowl that sits over the cervix (diaphragm). The bowl must be made for you. The bowl is put into the vagina before sex. The bowl is left in for 6-8 hours after sex. It is taken out within 24 hours. A small, soft cup that fits over the cervix (cervical cap). The cup must be made for you. The cup should be left in for 6-8 hours after sex. It is taken out within 48 hours. A sponge that is put into the vagina before sex. It must be left in for at least 6 hours after sex. It must be taken out within 30 hours and thrown away. A chemical that kills or stops sperm from getting into the womb (uterus). This chemical is called a spermicide. It may be a pill, cream, jelly, or foam to put in the vagina. The  chemical should be used at least 10-15 minutes before sex. IUD birth control IUD means "intrauterine device." It is put inside the womb. There are two kinds: Hormone IUD. This kind can stay in the womb for 3-5 years. Copper IUD. This kind can stay in the womb for 10 years. Permanent birth control Here are some types of permanent birth control: Surgery to block the fallopian tubes. Having an insert put into each fallopian tube. This method takes 3 months to work. Other forms of birth control must be used for 3 months. Surgery to tie off the tubes that carry sperm in men (vasectomy). This method takes 3 months to work. Other forms of birth control must be used for 3 months. Natural planning birth control Here are some types of natural planning birth control: Not having sex on the days the woman could get pregnant. Using a calendar: To keep track of the length of each menstrual cycle. To find out what days pregnancy can happen. To plan to not have sex on days when pregnancy can happen. Watching for signs of ovulation and not having sex during this time. One way the woman can check for ovulation is to check her temperature. Waiting to have sex until after ovulation. Where to find more information Centers for Disease Control  and Prevention: [employer].br Summary Contraception, also called birth control, refers to things you do or use to prevent pregnancy. Hormonal methods of birth control include implants, injections, pills, patches, vaginal rings, and emergency birth control pills. Barrier methods of birth control can include female condoms, female condoms, diaphragms, cervical caps, sponges, and spermicides. There are two types of IUD (intrauterine device) birth control. An IUD can be put in a woman's womb to prevent pregnancy for several years. Permanent birth control can be done through a procedure for males, females, or both. Natural planning means not having sex when the woman could get  pregnant. This information is not intended to replace advice given to you by your health care provider. Make sure you discuss any questions you have with your health care provider. Document Revised: 12/24/2019 Document Reviewed: 12/24/2019 Elsevier Patient Education  2023 Elsevier Inc.   Thank you for coming to your visit as scheduled. We have had a large "no-show" problem lately, and this significantly limits our ability to see and care for patients. As a friendly reminder- if you cannot make your appointment please call to cancel. We do have a no show policy for those who do not cancel within 24 hours. Our policy is that if you miss or fail to cancel an appointment within 24 hours, 3 times in a 66-month period, you may be dismissed from our clinic.   Thank you for choosing [facility].   Please call [phone] with any questions about today's appointment.  Please be sure to schedule follow up at the front  desk before you leave today.   Sabino Dick, DO PGY-3 Family Medicine

## 2022-07-21 ENCOUNTER — Ambulatory Visit: Payer: Medicaid Other | Admitting: Family Medicine

## 2022-08-23 DIAGNOSIS — N898 Other specified noninflammatory disorders of vagina: Secondary | ICD-10-CM | POA: Diagnosis not present

## 2022-08-23 DIAGNOSIS — N926 Irregular menstruation, unspecified: Secondary | ICD-10-CM | POA: Diagnosis not present

## 2022-08-23 DIAGNOSIS — Z3202 Encounter for pregnancy test, result negative: Secondary | ICD-10-CM | POA: Diagnosis not present

## 2022-08-23 DIAGNOSIS — R300 Dysuria: Secondary | ICD-10-CM | POA: Diagnosis not present

## 2022-08-23 DIAGNOSIS — Z7251 High risk heterosexual behavior: Secondary | ICD-10-CM | POA: Diagnosis not present

## 2022-09-15 ENCOUNTER — Encounter: Payer: Medicaid Other | Admitting: Obstetrics and Gynecology

## 2022-10-21 ENCOUNTER — Ambulatory Visit: Payer: Medicaid Other | Admitting: Family Medicine

## 2022-11-27 ENCOUNTER — Ambulatory Visit: Payer: Medicaid Other

## 2022-12-16 ENCOUNTER — Ambulatory Visit: Payer: Medicaid Other | Admitting: Family Medicine

## 2023-01-07 ENCOUNTER — Ambulatory Visit: Payer: Medicaid Other | Admitting: Family Medicine

## 2023-01-07 ENCOUNTER — Encounter: Payer: Self-pay | Admitting: Family Medicine

## 2023-01-07 ENCOUNTER — Ambulatory Visit (INDEPENDENT_AMBULATORY_CARE_PROVIDER_SITE_OTHER): Payer: Medicaid Other | Admitting: Family Medicine

## 2023-01-07 VITALS — BP 110/75 | HR 71 | Ht 63.0 in | Wt 213.0 lb

## 2023-01-07 DIAGNOSIS — N926 Irregular menstruation, unspecified: Secondary | ICD-10-CM

## 2023-01-07 DIAGNOSIS — Z30011 Encounter for initial prescription of contraceptive pills: Secondary | ICD-10-CM | POA: Diagnosis not present

## 2023-01-07 DIAGNOSIS — Z332 Encounter for elective termination of pregnancy: Secondary | ICD-10-CM | POA: Diagnosis not present

## 2023-01-07 MED ORDER — NORETHINDRONE ACET-ETHINYL EST 1.5-30 MG-MCG PO TABS: 1.0000 | ORAL_TABLET | Freq: Every day | ORAL | 11 refills | Status: DC

## 2023-01-07 NOTE — Patient Instructions (Signed)
I have sent in your birth control pills, please let me know if you have any issues with obtaining them or if there is a co-pay that you are not able to afford.

## 2023-01-07 NOTE — Progress Notes (Signed)
    SUBJECTIVE:   CHIEF COMPLAINT / HPI:   Contraception counseling - Previously had Nexplanon, which was removed in 2023 - Does not desire for the Nexplanon due to concerns for weight gain - Patient would like to reinitiate pills, which she was on previously for several years - Patient notes medical abortive therapy May 24  PERTINENT  PMH / PSH: Reviewed  OBJECTIVE:   BP 110/75   Pulse 71   Ht 5\' 3"  (1.6 m)   Wt 213 lb (96.6 kg)   LMP  (LMP Unknown)   SpO2 100%   BMI 37.73 kg/m   General: NAD, well-appearing, well-nourished Respiratory: No respiratory distress, breathing comfortably, able to speak in full sentences Skin: warm and dry, no rashes noted on exposed skin Psych: Appropriate affect and mood  ASSESSMENT/PLAN:   Contraception counseling Patient desires pills, does not wish to pursue a repeat Nexplanon at this time.  Chart review performed to determine which OCP patient previously used.  Given the recent medical abortion 2 weeks prior, patient would still have positive urinary pregnancy test and do not need blood testing at this time. - Junel 1.5/30  Irregular menstrual periods Counseled patient that if she desires, she can skip the placebo weeks of her birth control pills in order to regulate her menstrual cycles.   Evelena Leyden, DO [place]

## 2023-01-10 ENCOUNTER — Ambulatory Visit: Payer: Medicaid Other

## 2023-01-10 ENCOUNTER — Ambulatory Visit
Admission: EM | Admit: 2023-01-10 | Discharge: 2023-01-10 | Disposition: A | Discharge status: Home / Self Care | Payer: Medicaid Other | Attending: Nurse Practitioner | Admitting: Nurse Practitioner

## 2023-01-10 DIAGNOSIS — N760 Acute vaginitis: Secondary | ICD-10-CM | POA: Insufficient documentation

## 2023-01-10 MED ORDER — FLUCONAZOLE 150 MG PO TABS: 150.0000 mg | ORAL_TABLET | Freq: Once | ORAL | 0 refills | Status: AC

## 2023-01-10 NOTE — ED Provider Notes (Signed)
[facility]    CSN: [id number] Arrival date & time: 01/10/23  1542      History   Chief Complaint Chief Complaint  Patient presents with   Vaginitis    HPI Angela Johnston is a 22 y.o. female.   Patient presents today with 4-day history of vaginal itching and white, thin vaginal discharge.  She denies dysuria, urinary frequency or urgency, pelvic pain, exposure to STI, abdominal pain, fever, nausea/vomiting.  She also has malodorous vaginal discharge, however it is not fishy.  Reports that she is intensely itchy.  No vaginal sores, rashes, or lesions.  Reports history of similar approximately 1 year ago, diagnosed with vaginitis.    Past Medical History:  Diagnosis Date   History of postpartum hypertension 09/30/2020   Maternal varicella, non-immune 06/19/2020   Medical history non-contributory    Sickle cell trait (HCC) 07/16/2020   Noted on Hgb electrophoresis this pregnancy    Patient Active Problem List   Diagnosis Date Noted   General counselling and advice on contraception 01/15/2022   Healthcare maintenance 01/15/2022   Mood disorder (HCC) 09/01/2021   Yeast vaginitis 09/01/2021   Menstrual periods irregular 01/28/2021    Past Surgical History:  Procedure Laterality Date   CESAREAN SECTION N/A 09/21/2020   Procedure: CESAREAN SECTION;  Surgeon: Adam Phenix, MD;  Location: [facility];  Service: Obstetrics;  Laterality: N/A;   NO PAST SURGERIES     WISDOM TOOTH EXTRACTION      OB History     Gravida  1   Para  1   Term  1   Preterm      AB      Living  1      SAB      IAB      Ectopic      Multiple  0   Live Births  1            Home Medications    Prior to Admission medications   Medication Sig Start Date End Date Taking? Authorizing Provider  fluconazole (DIFLUCAN) 150 MG tablet Take 1 tablet (150 mg total) by mouth once for 1 dose. 01/10/23 01/10/23 Yes Valentino Nose, NP  Norethindrone Acetate-Ethinyl  Estradiol (JUNEL 1.5/30) 1.5-30 MG-MCG tablet Take 1 tablet by mouth daily. 01/07/23  Yes Lilland, Alana, DO  FLUoxetine (PROZAC) 40 MG capsule Take 1 capsule (40 mg total) by mouth daily. 05/27/22   Shelby Mattocks, DO    Family History Family History  Problem Relation Age of Onset   Healthy Mother    Healthy Father    Heart murmur Maternal Grandmother     Social History Social History   Tobacco Use   Smoking status: Never   Smokeless tobacco: Never  Vaping Use   Vaping Use: Never used  Substance Use Topics   Alcohol use: Not Currently   Drug use: Not Currently     Allergies   Patient has no known allergies.   Review of Systems Review of Systems Per HPI  Physical Exam Triage Vital Signs ED Triage Vitals [01/10/23 1625]  Enc Vitals Group     BP (!) 106/57     Pulse Rate 67     Resp 18     Temp 98.7 F (37.1 C)     Temp Source Oral     SpO2 98 %     Weight      Height      Head Circumference  Peak Flow      Pain Score 0     Pain Loc      Pain Edu?      Excl. in GC?    No data found.  Updated Vital Signs BP (!) 106/57 (BP Location: Right Arm)   Pulse 67   Temp 98.7 F (37.1 C) (Oral)   Resp 18   LMP 11/09/2022 (Approximate)   SpO2 98%   Visual Acuity Right Eye Distance:   Left Eye Distance:   Bilateral Distance:    Right Eye Near:   Left Eye Near:    Bilateral Near:     Physical Exam Vitals and nursing note reviewed. Exam conducted with a chaperone present Ardeth Perfect, CMA).  Constitutional:      General: She is not in acute distress.    Appearance: Normal appearance. She is not toxic-appearing.  Pulmonary:     Effort: Pulmonary effort is normal. No respiratory distress.  Genitourinary:    Exam position: Lithotomy position.     Pubic Area: No rash or pubic lice.      Labia:        Right: No rash or lesion.        Left: No rash or lesion.      Comments: White, clumpy discharge noted to external labia Lymphadenopathy:     Lower  Body: No right inguinal adenopathy. No left inguinal adenopathy.  Skin:    General: Skin is warm and dry.     Coloration: Skin is not jaundiced or pale.     Findings: No erythema.  Neurological:     Mental Status: She is alert and oriented to person, place, and time.  Psychiatric:        Behavior: Behavior is cooperative.      UC Treatments / Results  Labs (all labs ordered are listed, but only abnormal results are displayed) Labs Reviewed  RPR  HIV ANTIBODY (ROUTINE TESTING W REFLEX)  CERVICOVAGINAL ANCILLARY ONLY    EKG   Radiology No results found.  Procedures Procedures (including critical care time)  Medications Ordered in UC Medications - No data to display  Initial Impression / Assessment and Plan / UC Course  I have reviewed the triage vital signs and the nursing notes.  Pertinent labs & imaging results that were available during my care of the patient were reviewed by me and considered in my medical decision making (see chart for details).   Patient is well-appearing, normotensive, afebrile, not tachycardic, not tachypneic, oxygenating well on room air.    1. Vaginitis and vulvovaginitis Given exam, I am suspicious for yeast vaginitis Treat with fluconazole 1 dose Cytology swab is pending HIV and syphilis testing is also pending Condom use recommended with every sexual encounter moving forward and condoms given today  The patient was given the opportunity to ask questions.  All questions answered to their satisfaction.  The patient is in agreement to this plan.    Final Clinical Impressions(s) / UC Diagnoses   Final diagnoses:  Vaginitis and vulvovaginitis     Discharge Instructions      Please take the fluconazole to treat the yeast infection.  We we will call you later this week if the testing from today shows we need to treat with anything else.  Please use condoms with every sexual encounter.     ED Prescriptions     Medication Sig  Dispense Auth. Provider   fluconazole (DIFLUCAN) 150 MG tablet Take 1 tablet (150 mg  total) by mouth once for 1 dose. 1 tablet Valentino Nose, NP      PDMP not reviewed this encounter.   Valentino Nose, NP 01/10/23 1704

## 2023-01-10 NOTE — ED Triage Notes (Signed)
Pt presents with vaginal itching, that started Friday. Not taking any OTC medication at time.

## 2023-01-10 NOTE — Discharge Instructions (Signed)
Please take the fluconazole to treat the yeast infection.  We we will call you later this week if the testing from today shows we need to treat with anything else.  Please use condoms with every sexual encounter.

## 2023-01-11 LAB — RPR: RPR Ser Ql: NONREACTIVE

## 2023-01-11 LAB — HIV ANTIBODY (ROUTINE TESTING W REFLEX): HIV Screen 4th Generation wRfx: NONREACTIVE

## 2023-01-12 LAB — CERVICOVAGINAL ANCILLARY ONLY
Bacterial Vaginitis (gardnerella): NEGATIVE
Candida Glabrata: NEGATIVE
Candida Vaginitis: POSITIVE — AB
Chlamydia: NEGATIVE
Comment: NEGATIVE
Comment: NEGATIVE
Comment: NEGATIVE
Comment: NEGATIVE
Comment: NEGATIVE
Comment: NORMAL
Neisseria Gonorrhea: NEGATIVE
Trichomonas: NEGATIVE

## 2023-01-24 ENCOUNTER — Ambulatory Visit: Payer: Medicaid Other | Admitting: Family Medicine

## 2023-01-26 ENCOUNTER — Other Ambulatory Visit: Payer: Self-pay | Admitting: Nurse Practitioner

## 2023-01-30 ENCOUNTER — Emergency Department (HOSPITAL_COMMUNITY)
Admission: EM | Admit: 2023-01-30 | Discharge: 2023-01-31 | Disposition: A | Discharge status: Home / Self Care | Payer: Medicaid Other | Attending: Emergency Medicine | Admitting: Emergency Medicine

## 2023-01-30 ENCOUNTER — Emergency Department (HOSPITAL_COMMUNITY): Payer: Medicaid Other

## 2023-01-30 ENCOUNTER — Ambulatory Visit
Admission: EM | Admit: 2023-01-30 | Discharge: 2023-01-30 | Disposition: A | Discharge status: Home / Self Care | Payer: Medicaid Other | Attending: Family Medicine | Admitting: Family Medicine

## 2023-01-30 ENCOUNTER — Other Ambulatory Visit: Payer: Self-pay

## 2023-01-30 ENCOUNTER — Encounter (HOSPITAL_COMMUNITY): Payer: Self-pay

## 2023-01-30 DIAGNOSIS — R1011 Right upper quadrant pain: Secondary | ICD-10-CM | POA: Diagnosis not present

## 2023-01-30 DIAGNOSIS — I2694 Multiple subsegmental pulmonary emboli without acute cor pulmonale: Secondary | ICD-10-CM | POA: Insufficient documentation

## 2023-01-30 DIAGNOSIS — R7989 Other specified abnormal findings of blood chemistry: Secondary | ICD-10-CM | POA: Diagnosis not present

## 2023-01-30 DIAGNOSIS — I2699 Other pulmonary embolism without acute cor pulmonale: Secondary | ICD-10-CM | POA: Diagnosis not present

## 2023-01-30 DIAGNOSIS — R8290 Unspecified abnormal findings in urine: Secondary | ICD-10-CM | POA: Insufficient documentation

## 2023-01-30 DIAGNOSIS — N281 Cyst of kidney, acquired: Secondary | ICD-10-CM | POA: Diagnosis not present

## 2023-01-30 DIAGNOSIS — R109 Unspecified abdominal pain: Secondary | ICD-10-CM | POA: Insufficient documentation

## 2023-01-30 LAB — COMPREHENSIVE METABOLIC PANEL
ALT: 14 U/L (ref 0–44)
AST: 17 U/L (ref 15–41)
Albumin: 3.5 g/dL (ref 3.5–5.0)
Alkaline Phosphatase: 85 U/L (ref 38–126)
Anion gap: 9 (ref 5–15)
BUN: 6 mg/dL (ref 6–20)
CO2: 22 mmol/L (ref 22–32)
Calcium: 9 mg/dL (ref 8.9–10.3)
Chloride: 106 mmol/L (ref 98–111)
Creatinine, Ser: 0.88 mg/dL (ref 0.44–1.00)
GFR, Estimated: >60 mL/min (ref >60)
Glucose, Bld: 119 mg/dL — ABNORMAL HIGH (ref 70–99)
Potassium: 3.8 mmol/L (ref 3.5–5.1)
Sodium: 137 mmol/L (ref 135–145)
Total Bilirubin: 0.4 mg/dL (ref 0.3–1.2)
Total Protein: 7.6 g/dL (ref 6.5–8.1)

## 2023-01-30 LAB — PREGNANCY, URINE: Preg Test, Ur: NEGATIVE

## 2023-01-30 LAB — CBC
HCT: 35.7 % — ABNORMAL LOW (ref 36.0–46.0)
Hemoglobin: 11.4 g/dL — ABNORMAL LOW (ref 12.0–15.0)
MCH: 25.7 pg — ABNORMAL LOW (ref 26.0–34.0)
MCHC: 31.9 g/dL (ref 30.0–36.0)
MCV: 80.6 fL (ref 80.0–100.0)
Platelets: 300 10*3/uL (ref 150–400)
RBC: 4.43 MIL/uL (ref 3.87–5.11)
RDW: 14.8 % (ref 11.5–15.5)
WBC: 8.4 10*3/uL (ref 4.0–10.5)
nRBC: 0 % (ref 0.0–0.2)

## 2023-01-30 LAB — URINALYSIS, ROUTINE W REFLEX MICROSCOPIC
Bilirubin Urine: NEGATIVE
Glucose, UA: NEGATIVE mg/dL
Hgb urine dipstick: NEGATIVE
Ketones, ur: NEGATIVE mg/dL
Leukocytes,Ua: NEGATIVE
Nitrite: NEGATIVE
Protein, ur: NEGATIVE mg/dL
Specific Gravity, Urine: 1.016 (ref 1.005–1.030)
pH: 5 (ref 5.0–8.0)

## 2023-01-30 LAB — POCT URINALYSIS DIP (MANUAL ENTRY)
Bilirubin, UA: NEGATIVE
Glucose, UA: NEGATIVE mg/dL
Ketones, POC UA: NEGATIVE mg/dL
Nitrite, UA: NEGATIVE
Protein Ur, POC: NEGATIVE mg/dL
Spec Grav, UA: 1.025 (ref 1.010–1.025)
Urobilinogen, UA: 1 E.U./dL
pH, UA: 6 (ref 5.0–8.0)

## 2023-01-30 LAB — LIPASE, BLOOD: Lipase: 26 U/L (ref 11–51)

## 2023-01-30 MED ORDER — FENTANYL CITRATE PF 50 MCG/ML IJ SOSY
50.0000 ug | PREFILLED_SYRINGE | Freq: Once | INTRAMUSCULAR | Status: AC
  Administered 2023-01-30: 50 ug via INTRAVENOUS
  Filled 2023-01-30: qty 1

## 2023-01-30 MED ORDER — ONDANSETRON HCL 4 MG/2ML IJ SOLN
4.0000 mg | Freq: Once | INTRAMUSCULAR | Status: AC
  Administered 2023-01-30: 4 mg via INTRAVENOUS
  Filled 2023-01-30: qty 2

## 2023-01-30 NOTE — ED Provider Notes (Signed)
[facility] Provider Note   CSN: [id number] Arrival date & time: 01/30/23  2031     History {Add pertinent medical, surgical, social history, OB history to HPI:1} Chief Complaint  Patient presents with   Flank Pain    Angela Johnston is a 22 y.o. female.  Patient with 2-day history of right upper quadrant abdominal pain that is constant.  Worse with palpation.  Nothing makes it better.  No associated nausea or vomiting.  Still eating and drinking well.  No vomiting, chills, chest pain, shortness of breath, pain with urination or blood in the urine.  No vaginal bleeding or discharge.  She was seen in urgent care earlier today and told she had blood in the urine but did not receive a diagnosis.  Comes in today with worsening upper quadrant abdominal pain.  Denies flank pain.  Denies any movement of the pain.  Does not have any pain with urination or blood in the urine.  No vaginal bleeding or discharge.  No cough or fever.  No chest pain.  Still has appendix and gallbladder.  The history is provided by the patient.  Flank Pain Associated symptoms include abdominal pain. Pertinent negatives include no chest pain, no headaches and no shortness of breath.       Home Medications Prior to Admission medications   Medication Sig Start Date End Date Taking? Authorizing Provider  FLUoxetine (PROZAC) 40 MG capsule Take 1 capsule (40 mg total) by mouth daily. 05/27/22   Shelby Mattocks, DO  Norethindrone Acetate-Ethinyl Estradiol (JUNEL 1.5/30) 1.5-30 MG-MCG tablet Take 1 tablet by mouth daily. 01/07/23   Lilland, Percival Spanish, DO      Allergies    Patient has no known allergies.    Review of Systems   Review of Systems  Constitutional:  Negative for activity change, appetite change and fever.  HENT:  Negative for congestion and rhinorrhea.   Respiratory:  Negative for cough, chest tightness and shortness of breath.   Cardiovascular:  Negative for chest  pain.  Gastrointestinal:  Positive for abdominal pain. Negative for nausea and vomiting.  Genitourinary:  Positive for flank pain.  Musculoskeletal:  Negative for arthralgias, back pain and myalgias.  Skin:  Negative for rash.  Neurological:  Negative for dizziness, weakness and headaches.   all other systems are negative except as noted in the HPI and PMH.    Physical Exam Updated Vital Signs BP 125/78 (BP Location: Right Arm)   Pulse 100   Temp 99.6 F (37.6 C) (Oral)   Resp 19   Ht 5\' 3"  (1.6 m)   Wt 96.7 kg   LMP 11/09/2022 (Approximate)   SpO2 98%   BMI 37.76 kg/m  Physical Exam Vitals and nursing note reviewed.  Constitutional:      General: She is not in acute distress.    Appearance: She is well-developed.  HENT:     Head: Normocephalic and atraumatic.     Mouth/Throat:     Pharynx: No oropharyngeal exudate.  Eyes:     Conjunctiva/sclera: Conjunctivae normal.     Pupils: Pupils are equal, round, and reactive to light.  Neck:     Comments: No meningismus. Cardiovascular:     Rate and Rhythm: Normal rate and regular rhythm.     Heart sounds: Normal heart sounds. No murmur heard. Pulmonary:     Effort: Pulmonary effort is normal. No respiratory distress.     Breath sounds: Normal breath sounds.  Abdominal:  Palpations: Abdomen is soft.     Tenderness: There is abdominal tenderness. There is guarding. There is no rebound.     Comments: Tender right upper quadrant epigastric with voluntary guarding.  No rebound  Musculoskeletal:        General: No tenderness. Normal range of motion.     Cervical back: Normal range of motion and neck supple.     Comments: No CVA tenderness  Skin:    General: Skin is warm.  Neurological:     Mental Status: She is alert and oriented to person, place, and time.     Cranial Nerves: No cranial nerve deficit.     Motor: No abnormal muscle tone.     Coordination: Coordination normal.     Comments: No ataxia on finger to nose  bilaterally. No pronator drift. 5/5 strength throughout. CN 2-12 intact.Equal grip strength. Sensation intact.   Psychiatric:        Behavior: Behavior normal.     ED Results / Procedures / Treatments   Labs (all labs ordered are listed, but only abnormal results are displayed) Labs Reviewed  CBC - Abnormal; Notable for the following components:      Result Value   Hemoglobin 11.4 (*)    HCT 35.7 (*)    MCH 25.7 (*)    All other components within normal limits  COMPREHENSIVE METABOLIC PANEL - Abnormal; Notable for the following components:   Glucose, Bld 119 (*)    All other components within normal limits  URINALYSIS, ROUTINE W REFLEX MICROSCOPIC  HCG, SERUM, QUALITATIVE  LIPASE, BLOOD  PREGNANCY, URINE  D-DIMER, QUANTITATIVE    EKG None  Radiology No results found.  Procedures Procedures  {Document cardiac monitor, telemetry assessment procedure when appropriate:1}  Medications Ordered in ED Medications  fentaNYL (SUBLIMAZE) injection 50 mcg (has no administration in time range)  ondansetron (ZOFRAN) injection 4 mg (has no administration in time range)    ED Course/ Medical Decision Making/ A&P   {   Click here for ABCD2, HEART and other calculatorsREFRESH Note before signing :1}                          Medical Decision Making Amount and/or Complexity of Data Reviewed Labs: ordered. Decision-making details documented in ED Course. Radiology: ordered and independent interpretation performed. Decision-making details documented in ED Course. ECG/medicine tests: ordered and independent interpretation performed. Decision-making details documented in ED Course.  Risk Prescription drug management.   Right upper quadrant abdominal pain for the past 2 days.  Vital stable, no distress.  Abdomen soft without peritoneal signs  {Document critical care time when appropriate:1} {Document review of labs and clinical decision tools ie heart score, Chads2Vasc2 etc:1}   {Document your independent review of radiology images, and any outside records:1} {Document your discussion with family members, caretakers, and with consultants:1} {Document social determinants of health affecting pt's care:1} {Document your decision making why or why not admission, treatments were needed:1} Final Clinical Impression(s) / ED Diagnoses Final diagnoses:  None    Rx / DC Orders ED Discharge Orders     None

## 2023-01-30 NOTE — ED Triage Notes (Addendum)
Pt reports when she breathes in it hurts under her right "rib" x 2 days  Took ibuprofen but no relief.

## 2023-01-30 NOTE — ED Triage Notes (Signed)
Pt arrived from home via POV c/o right sided flank pain x 2 days 8/10 on pain scale.

## 2023-01-30 NOTE — ED Provider Notes (Signed)
[facility]    CSN: [id number] Arrival date & time: 01/30/23  1237      History   Chief Complaint No chief complaint on file.   HPI Angela Johnston is a 22 y.o. female.   Patient presenting today with 2-day history of right mid abdominal pain.  Denies any associated nausea, vomiting, diarrhea, constipation, fever, chills, chest pain, shortness of breath, dysuria, hematuria, vaginal symptoms.  States she mostly notices the pain when she sitting in certain positions or tries to take a deep breath.  It is a dull pain that does not last very long.  Trying ibuprofen with no relief.  No concern for pregnancy, on oral contraceptives.  Past abdominal surgeries significant for cesarean section.  No other past GI issues.    Past Medical History:  Diagnosis Date   History of postpartum hypertension 09/30/2020   Maternal varicella, non-immune 06/19/2020   Medical history non-contributory    Sickle cell trait (HCC) 07/16/2020   Noted on Hgb electrophoresis this pregnancy    Patient Active Problem List   Diagnosis Date Noted   General counselling and advice on contraception 01/15/2022   Healthcare maintenance 01/15/2022   Mood disorder (HCC) 09/01/2021   Yeast vaginitis 09/01/2021   Menstrual periods irregular 01/28/2021    Past Surgical History:  Procedure Laterality Date   CESAREAN SECTION N/A 09/21/2020   Procedure: CESAREAN SECTION;  Surgeon: Adam Phenix, MD;  Location: [facility];  Service: Obstetrics;  Laterality: N/A;   NO PAST SURGERIES     WISDOM TOOTH EXTRACTION      OB History     Gravida  1   Para  1   Term  1   Preterm      AB      Living  1      SAB      IAB      Ectopic      Multiple  0   Live Births  1            Home Medications    Prior to Admission medications   Medication Sig Start Date End Date Taking? Authorizing Provider  FLUoxetine (PROZAC) 40 MG capsule Take 1 capsule (40 mg total) by mouth daily. 05/27/22    Shelby Mattocks, DO  Norethindrone Acetate-Ethinyl Estradiol (JUNEL 1.5/30) 1.5-30 MG-MCG tablet Take 1 tablet by mouth daily. 01/07/23   Evelena Leyden, DO    Family History Family History  Problem Relation Age of Onset   Healthy Mother    Healthy Father    Heart murmur Maternal Grandmother     Social History Social History   Tobacco Use   Smoking status: Never   Smokeless tobacco: Never  Vaping Use   Vaping Use: Never used  Substance Use Topics   Alcohol use: Not Currently   Drug use: Not Currently     Allergies   Patient has no known allergies.   Review of Systems Review of Systems Per HPI  Physical Exam Triage Vital Signs ED Triage Vitals  Enc Vitals Group     BP 01/30/23 1331 129/79     Pulse Rate 01/30/23 1331 79     Resp 01/30/23 1331 20     Temp 01/30/23 1331 98.2 F (36.8 C)     Temp Source 01/30/23 1331 Oral     SpO2 01/30/23 1331 95 %     Weight --      Height --      Head  Circumference --      Peak Flow --      Pain Score 01/30/23 1332 8     Pain Loc --      Pain Edu? --      Excl. in GC? --    No data found.  Updated Vital Signs BP 129/79 (BP Location: Right Arm)   Pulse 79   Temp 98.2 F (36.8 C) (Oral)   Resp 20   LMP 11/09/2022 (Approximate)   SpO2 95%   Visual Acuity Right Eye Distance:   Left Eye Distance:   Bilateral Distance:    Right Eye Near:   Left Eye Near:    Bilateral Near:     Physical Exam Vitals and nursing note reviewed.  Constitutional:      Appearance: Normal appearance. She is not ill-appearing.  HENT:     Head: Atraumatic.     Mouth/Throat:     Mouth: Mucous membranes are moist.  Eyes:     Extraocular Movements: Extraocular movements intact.     Conjunctiva/sclera: Conjunctivae normal.  Cardiovascular:     Rate and Rhythm: Normal rate and regular rhythm.     Heart sounds: Normal heart sounds.  Pulmonary:     Effort: Pulmonary effort is normal.     Breath sounds: Normal breath sounds.  Abdominal:      General: Bowel sounds are normal. There is no distension.     Palpations: Abdomen is soft.     Tenderness: There is no abdominal tenderness. There is no right CVA tenderness, left CVA tenderness or guarding.  Musculoskeletal:        General: Normal range of motion.     Cervical back: Normal range of motion and neck supple.  Skin:    General: Skin is warm and dry.  Neurological:     Mental Status: She is alert and oriented to person, place, and time.  Psychiatric:        Mood and Affect: Mood normal.        Thought Content: Thought content normal.        Judgment: Judgment normal.      UC Treatments / Results  Labs (all labs ordered are listed, but only abnormal results are displayed) Labs Reviewed  POCT URINALYSIS DIP (MANUAL ENTRY) - Abnormal; Notable for the following components:      Result Value   Blood, UA small (*)    Leukocytes, UA Small (1+) (*)    All other components within normal limits  URINE CULTURE    EKG   Radiology No results found.  Procedures Procedures (including critical care time)  Medications Ordered in UC Medications - No data to display  Initial Impression / Assessment and Plan / UC Course  I have reviewed the triage vital signs and the nursing notes.  Pertinent labs & imaging results that were available during my care of the patient were reviewed by me and considered in my medical decision making (see chart for details).     Vital signs benign and reassuring, exam without any abnormal findings today.  Urinalysis with small leuks and small blood.  Urine culture pending for further rule out.  Discussed with patient the nonspecific nature of these findings, possibly early infection versus kidney stone versus some mild dehydration.  Push fluids, await urine culture and follow-up with PCP next week if not resolving.  ED for significantly worsening symptoms at any time.  Final Clinical Impressions(s) / UC Diagnoses   Final diagnoses:  Abnormal urinalysis  Right flank pain     Discharge Instructions      Drink plenty of fluids, take ibuprofen and tylenol as needed for pain. It is unclear exactly what is causing your discomfort at this time but we have sent out a urine culture to make sure it's not a UTI. Follow up with your Primary Care next week for recheck    ED Prescriptions   None    PDMP not reviewed this encounter.   Particia Nearing, [place] 01/30/23 1439

## 2023-01-30 NOTE — Discharge Instructions (Signed)
Drink plenty of fluids, take ibuprofen and tylenol as needed for pain. It is unclear exactly what is causing your discomfort at this time but we have sent out a urine culture to make sure it's not a UTI. Follow up with your Primary Care next week for recheck

## 2023-01-31 ENCOUNTER — Ambulatory Visit: Payer: Medicaid Other | Admitting: Family Medicine

## 2023-01-31 ENCOUNTER — Emergency Department (HOSPITAL_COMMUNITY): Payer: Medicaid Other

## 2023-01-31 DIAGNOSIS — R1011 Right upper quadrant pain: Secondary | ICD-10-CM | POA: Diagnosis not present

## 2023-01-31 DIAGNOSIS — R7989 Other specified abnormal findings of blood chemistry: Secondary | ICD-10-CM | POA: Diagnosis not present

## 2023-01-31 DIAGNOSIS — N281 Cyst of kidney, acquired: Secondary | ICD-10-CM | POA: Diagnosis not present

## 2023-01-31 DIAGNOSIS — R109 Unspecified abdominal pain: Secondary | ICD-10-CM | POA: Diagnosis not present

## 2023-01-31 DIAGNOSIS — I2699 Other pulmonary embolism without acute cor pulmonale: Secondary | ICD-10-CM | POA: Diagnosis not present

## 2023-01-31 LAB — D-DIMER, QUANTITATIVE: D-Dimer, Quant: 6.69 ug/mL-FEU — ABNORMAL HIGH (ref 0.00–0.50)

## 2023-01-31 LAB — TROPONIN I (HIGH SENSITIVITY): Troponin I (High Sensitivity): 4 ng/L (ref <18)

## 2023-01-31 LAB — ANTITHROMBIN III: AntiThromb III Func: 96 % (ref 75–120)

## 2023-01-31 LAB — HCG, SERUM, QUALITATIVE: Preg, Serum: NEGATIVE

## 2023-01-31 MED ORDER — KETOROLAC TROMETHAMINE 15 MG/ML IJ SOLN
15.0000 mg | Freq: Once | INTRAMUSCULAR | Status: AC
  Administered 2023-01-31: 15 mg via INTRAVENOUS
  Filled 2023-01-31: qty 1

## 2023-01-31 MED ORDER — APIXABAN (ELIQUIS) VTE STARTER PACK (10MG AND 5MG): ORAL_TABLET | ORAL | 0 refills | Status: DC

## 2023-01-31 MED ORDER — APIXABAN (ELIQUIS) EDUCATION KIT FOR DVT/PE PATIENTS
PACK | Freq: Once | Status: AC
  Filled 2023-01-31: qty 1

## 2023-01-31 MED ORDER — IOHEXOL 350 MG/ML SOLN
75.0000 mL | Freq: Once | INTRAVENOUS | Status: AC | PRN
  Administered 2023-01-31: 75 mL via INTRAVENOUS

## 2023-01-31 MED ORDER — FENTANYL CITRATE PF 50 MCG/ML IJ SOSY
50.0000 ug | PREFILLED_SYRINGE | Freq: Once | INTRAMUSCULAR | Status: AC
  Administered 2023-01-31: 50 ug via INTRAVENOUS
  Filled 2023-01-31: qty 1

## 2023-01-31 MED ORDER — APIXABAN 5 MG PO TABS
10.0000 mg | ORAL_TABLET | Freq: Once | ORAL | Status: AC
  Administered 2023-01-31: 10 mg via ORAL
  Filled 2023-01-31: qty 2

## 2023-01-31 NOTE — ED Notes (Signed)
Pt back from CT

## 2023-01-31 NOTE — ED Notes (Signed)
The pt is getting  an [place] at presenr

## 2023-01-31 NOTE — Discharge Instructions (Addendum)
Stop taking your birth control pill and talk to your doctor about an alternative form of birth control. Take the blood thinner as prescribed.  It is very important to follow-up with your primary doctor in the next several weeks to receive a prescription before this 1 runs out.  This is a blood thinner and your risk of bleeding is increased you may bleed more than you think you should after an injury.  If you hit your head, you must come to the hospital to be evaluated afterwards. Return to the ED with difficulty breathing, chest pain, and other concerns.

## 2023-01-31 NOTE — ED Notes (Signed)
While ambulating O2 stays above 97.

## 2023-01-31 NOTE — ED Notes (Signed)
To ct

## 2023-02-01 LAB — URINE CULTURE

## 2023-02-01 LAB — BETA-2-GLYCOPROTEIN I ABS, IGG/M/A
Beta-2 Glyco I IgG: <9 GPI IgG units (ref 0–20)
Beta-2-Glycoprotein I IgA: 11 GPI IgA units (ref 0–25)
Beta-2-Glycoprotein I IgM: <9 GPI IgM units (ref 0–32)

## 2023-02-01 LAB — HOMOCYSTEINE: Homocysteine: 24.3 umol/L — ABNORMAL HIGH (ref 0.0–14.5)

## 2023-02-02 ENCOUNTER — Emergency Department (HOSPITAL_COMMUNITY)
Admission: EM | Admit: 2023-02-02 | Discharge: 2023-02-03 | Disposition: A | Discharge status: Home / Self Care | Payer: Medicaid Other | Attending: Emergency Medicine | Admitting: Emergency Medicine

## 2023-02-02 ENCOUNTER — Ambulatory Visit: Payer: Self-pay

## 2023-02-02 ENCOUNTER — Other Ambulatory Visit: Payer: Self-pay

## 2023-02-02 DIAGNOSIS — R319 Hematuria, unspecified: Secondary | ICD-10-CM | POA: Diagnosis present

## 2023-02-02 DIAGNOSIS — N3090 Cystitis, unspecified without hematuria: Secondary | ICD-10-CM

## 2023-02-02 DIAGNOSIS — N3091 Cystitis, unspecified with hematuria: Secondary | ICD-10-CM | POA: Insufficient documentation

## 2023-02-02 DIAGNOSIS — Z7901 Long term (current) use of anticoagulants: Secondary | ICD-10-CM | POA: Insufficient documentation

## 2023-02-02 DIAGNOSIS — Z86711 Personal history of pulmonary embolism: Secondary | ICD-10-CM | POA: Insufficient documentation

## 2023-02-02 LAB — BASIC METABOLIC PANEL
Anion gap: 9 (ref 5–15)
BUN: 6 mg/dL (ref 6–20)
CO2: 21 mmol/L — ABNORMAL LOW (ref 22–32)
Calcium: 8.8 mg/dL — ABNORMAL LOW (ref 8.9–10.3)
Chloride: 108 mmol/L (ref 98–111)
Creatinine, Ser: 0.75 mg/dL (ref 0.44–1.00)
GFR, Estimated: >60 mL/min (ref >60)
Glucose, Bld: 137 mg/dL — ABNORMAL HIGH (ref 70–99)
Potassium: 3.7 mmol/L (ref 3.5–5.1)
Sodium: 138 mmol/L (ref 135–145)

## 2023-02-02 LAB — CBC
HCT: 35.1 % — ABNORMAL LOW (ref 36.0–46.0)
Hemoglobin: 11.5 g/dL — ABNORMAL LOW (ref 12.0–15.0)
MCH: 26.7 pg (ref 26.0–34.0)
MCHC: 32.8 g/dL (ref 30.0–36.0)
MCV: 81.4 fL (ref 80.0–100.0)
Platelets: 317 10*3/uL (ref 150–400)
RBC: 4.31 MIL/uL (ref 3.87–5.11)
RDW: 14.9 % (ref 11.5–15.5)
WBC: 6.4 10*3/uL (ref 4.0–10.5)
nRBC: 0 % (ref 0.0–0.2)

## 2023-02-02 LAB — LUPUS ANTICOAGULANT PANEL
DRVVT: 49.4 s — ABNORMAL HIGH (ref 0.0–47.0)
PTT Lupus Anticoagulant: 38.6 s (ref 0.0–43.5)

## 2023-02-02 LAB — PROTEIN S, TOTAL: Protein S Ag, Total: 77 % (ref 60–150)

## 2023-02-02 LAB — DRVVT MIX: dRVVT Mix: 43.2 s — ABNORMAL HIGH (ref 0.0–40.4)

## 2023-02-02 LAB — CARDIOLIPIN ANTIBODIES, IGG, IGM, IGA
Anticardiolipin IgA: <9 APL U/mL (ref 0–11)
Anticardiolipin IgG: <9 GPL U/mL (ref 0–14)
Anticardiolipin IgM: <9 MPL U/mL (ref 0–12)

## 2023-02-02 LAB — DRVVT CONFIRM: dRVVT Confirm: 1.3 ratio — ABNORMAL HIGH (ref 0.8–1.2)

## 2023-02-02 LAB — PROTEIN C, TOTAL: Protein C, Total: 105 % (ref 60–150)

## 2023-02-02 LAB — PROTEIN C ACTIVITY: Protein C Activity: 111 % (ref 73–180)

## 2023-02-02 LAB — PROTEIN S ACTIVITY: Protein S Activity: 90 % (ref 63–140)

## 2023-02-02 NOTE — ED Triage Notes (Signed)
Pt c/o hematuria that began today, was recently seen at [facility] with PE and was started Eliquis .

## 2023-02-02 NOTE — Telephone Encounter (Signed)
Chief Complaint: Vaginal bleeding  Symptoms: vaginal bleeding with a very small clot Frequency: 2 occurences after using the restroom. Onset today Pertinent Negatives: Patient denies dizziness, headache, faint, abdominal pain. Disposition: [] ED /[] Urgent Care (no appt availability in office) / [] Appointment(In office/virtual)/ []  [facility] [facility]/ [] Home Care/ [] Refused Recommended Disposition /[] [place] [facility]/ []  Follow-up with PCP Additional Notes: Patient reports vaginal bleeding while wiping after using the restroom. Patient states she is current taking eliquis due to a blood clots found in the lungs and was advised to stop taking her birth control pills. Patient started the Eliquis and stopped the birth control on Sunday. Patient stated that she noticed a small amount of blood on the tissue after wiping this morning and again while on the phone with me. Patient stated she has a hospital follow-up with her provider on Friday. Advised patient to go to the back to the ED if symptoms get worse and to keep her appointment with provider on Friday. Patient verbalized understanding.   Summary: pt on blood thinners and experience some bleeding   Pt has called in as was in the hospital over the weekend with blood clots in her lungs, States on blood thinners and now when wiping she is bleeding  some. Pt called in on Community line. Pt wants a nurse to fu at [phone]      Reason for Disposition  Taking Coumadin (warfarin) or other strong blood thinner, or known bleeding disorder (e.g., thrombocytopenia)  Answer Assessment - Initial Assessment Questions 1. NAME of MEDICINE: "What medicine(s) are you calling about?"     Eliquis 5mg  daily 2. QUESTION: "What is your question?" (e.g., double dose of medicine, side effect)     Started taking the medication on Sunday. I started bleeding today about ago. 3. PRESCRIBER: "Who prescribed the medicine?" Reason: if prescribed by  specialist, call should be referred to that group.     I got it from the hospital at [facility] ED 4. SYMPTOMS: "Do you have any symptoms?" If Yes, ask: "What symptoms are you having?"  "How bad are the symptoms (e.g., mild, moderate, severe)     Bleeding, I only noticed it when I wiped after using the bathroom.  Answer Assessment - Initial Assessment Questions 1. AMOUNT: "Describe the bleeding that you are having."    - SPOTTING: spotting, or pinkish / brownish mucous discharge; does not fill panty liner or pad    - MILD:  less than 1 pad / hour; less than patient's usual menstrual bleeding   - MODERATE: 1-2 pads / hour; 1 menstrual cup every 6 hours; small-medium blood clots (e.g., pea, grape, small coin)   - SEVERE: soaking 2 or more pads/hour for 2 or more hours; 1 menstrual cup every 2 hours; bleeding not contained by pads or continuous red blood from vagina; large blood clots (e.g., golf ball, large coin)      Small amount on the tissue after using the bathroom with 1 small clot smaller than a pea 2. ONSET: "When did the bleeding begin?" "Is it continuing now?"     Today 3. MENSTRUAL PERIOD: "When was the last normal menstrual period?" "How is this different than your period?"     April 9th 2024 4. REGULARITY: "How regular are your periods?"     No irregular 5. ABDOMEN PAIN: "Do you have any pain?" "How bad is the pain?"  (e.g., Scale 1-10; mild, moderate, or severe)   - MILD (1-3): doesn't interfere with  normal activities, abdomen soft and not tender to touch    - MODERATE (4-7): interferes with normal activities or awakens from sleep, abdomen tender to touch    - SEVERE (8-10): excruciating pain, doubled over, unable to do any normal activities      1 out of 10 6. PREGNANCY: "Is there any chance you are pregnant?" "When was your last menstrual period?"     No 7. BREASTFEEDING: "Are you breastfeeding?"     No 8. HORMONE MEDICINES: "Are you taking any hormone medicines, prescription  or over-the-counter?" (e.g., birth control pills, estrogen)     I stopped taking birth control pills on sunday 9. BLOOD THINNER MEDICINES: "Do you take any blood thinners?" (e.g., Coumadin / warfarin, Pradaxa / dabigatran, aspirin)     Elquis 5mg  2 times a day 10. CAUSE: "What do you think is causing the bleeding?" (e.g., recent gyn surgery, recent gyn procedure; known bleeding disorder, cervical cancer, polycystic ovarian disease, fibroids)         I just stop taking birth control 11. HEMODYNAMIC STATUS: "Are you weak or feeling lightheaded?" If Yes, ask: "Can you stand and walk normally?"        No 12. OTHER SYMPTOMS: "What other symptoms are you having with the bleeding?" (e.g., passed tissue, vaginal discharge, fever, menstrual-type cramps)       Right side pain under ribcage, I'm still taking tylenol for that.  Protocols used: Medication Question Call-A-AH, Vaginal Bleeding - Abnormal-A-AH

## 2023-02-03 LAB — URINALYSIS, ROUTINE W REFLEX MICROSCOPIC
Bacteria, UA: NONE SEEN
Bilirubin Urine: NEGATIVE
Glucose, UA: NEGATIVE mg/dL
Ketones, ur: NEGATIVE mg/dL
Leukocytes,Ua: NEGATIVE
Nitrite: NEGATIVE
Protein, ur: 100 mg/dL — AB
RBC / HPF: >50 RBC/hpf (ref 0–5)
Specific Gravity, Urine: 1.019 (ref 1.005–1.030)
pH: 5 (ref 5.0–8.0)

## 2023-02-03 LAB — PREGNANCY, URINE: Preg Test, Ur: NEGATIVE

## 2023-02-03 MED ORDER — CEPHALEXIN 500 MG PO CAPS: 500.0000 mg | ORAL_CAPSULE | Freq: Two times a day (BID) | ORAL | 0 refills | Status: AC

## 2023-02-03 NOTE — ED Provider Notes (Signed)
[facility]  Provider Note  CSN: [id number] Arrival date & time: 02/02/23 2202  History Chief Complaint  Patient presents with   Hematuria    Angela Johnston is a 22 y.o. female recently diagnosed with PE and started on Eliquis at an ED visit 3 days ago. She had been on OCPs previously and was told to stop them then. Today she noticed blood in the toilet and on the tissue when she wiped. She saw some clots in the toilet but did not feel a clot come out when urinating. She thinks she may have started having a period since she stopped the OCPs, was previously on Nexplanon and has not had a menstrual cycle in a few years.   Home Medications Prior to Admission medications   Medication Sig Start Date End Date Taking? Authorizing Provider  cephALEXin (KEFLEX) 500 MG capsule Take 1 capsule (500 mg total) by mouth 2 (two) times daily for 7 days. 02/03/23 02/10/23 Yes Pollyann Savoy, MD  APIXABAN Everlene Balls) VTE STARTER PACK (10MG  AND 5MG ) Take as directed on package: start with two-5mg  tablets twice daily for 7 days. On day 8, switch to one-5mg  tablet twice daily. 01/31/23   Rancour, Jeannett Senior, MD  FLUoxetine (PROZAC) 40 MG capsule Take 1 capsule (40 mg total) by mouth daily. 05/27/22   Shelby Mattocks, DO  Norethindrone Acetate-Ethinyl Estradiol (JUNEL 1.5/30) 1.5-30 MG-MCG tablet Take 1 tablet by mouth daily. 01/07/23   Lilland, Percival Spanish, DO     Allergies    Patient has no known allergies.   Review of Systems   Review of Systems Please see HPI for pertinent positives and negatives  Physical Exam BP 106/80   Pulse 77   Temp 98.9 F (37.2 C) (Oral)   Resp 16   Ht 5\' 3"  (1.6 m)   Wt 96.6 kg   LMP 11/09/2022 (Approximate)   SpO2 97%   BMI 37.73 kg/m   Physical Exam Vitals and nursing note reviewed.  Constitutional:      Appearance: Normal appearance.  HENT:     Head: Normocephalic and atraumatic.     Nose: Nose normal.     Mouth/Throat:      Mouth: Mucous membranes are moist.  Eyes:     Extraocular Movements: Extraocular movements intact.     Conjunctiva/sclera: Conjunctivae normal.  Cardiovascular:     Rate and Rhythm: Normal rate.  Pulmonary:     Effort: Pulmonary effort is normal.     Breath sounds: Normal breath sounds.  Abdominal:     General: Abdomen is flat.     Palpations: Abdomen is soft.     Tenderness: There is no abdominal tenderness.  Musculoskeletal:        General: No swelling. Normal range of motion.     Cervical back: Neck supple.  Skin:    General: Skin is warm and dry.  Neurological:     General: No focal deficit present.     Mental Status: She is alert.  Psychiatric:        Mood and Affect: Mood normal.     ED Results / Procedures / Treatments   EKG None  Procedures Procedures  Medications Ordered in the ED Medications - No data to display  Initial Impression and Plan  Patient well appearing here for hematuria but more likely this is her menses. She recently stopped OCPs after diagnosis of PE so she is likely having a menstrual period for the first  time in several years and expected to be heavy. She has no change in Hgb on CBC. CMP is unremarkable. UA with blood and some WBC which could be infectious. Will treat for UTI, recommend she continue blood thinner. Follow up with PCP. RTED for any other concerns.   ED Course       MDM Rules/Calculators/A&P Medical Decision Making Problems Addressed: Cystitis: acute illness or injury  Amount and/or Complexity of Data Reviewed Labs: ordered. Decision-making details documented in ED Course.  Risk Prescription drug management.     Final Clinical Impression(s) / ED Diagnoses Final diagnoses:  Cystitis    Rx / DC Orders ED Discharge Orders          Ordered    cephALEXin (KEFLEX) 500 MG capsule  2 times daily        02/03/23 0155             Pollyann Savoy, MD 02/03/23 [phone]

## 2023-02-03 NOTE — ED Notes (Signed)
Called lab regarding pt's UA

## 2023-02-04 ENCOUNTER — Encounter: Payer: Self-pay | Admitting: Family Medicine

## 2023-02-04 ENCOUNTER — Ambulatory Visit (INDEPENDENT_AMBULATORY_CARE_PROVIDER_SITE_OTHER): Payer: Medicaid Other | Admitting: Family Medicine

## 2023-02-04 VITALS — BP 126/85 | HR 97 | Ht 63.0 in | Wt 212.0 lb

## 2023-02-04 DIAGNOSIS — I2699 Other pulmonary embolism without acute cor pulmonale: Secondary | ICD-10-CM

## 2023-02-04 DIAGNOSIS — Z3009 Encounter for other general counseling and advice on contraception: Secondary | ICD-10-CM

## 2023-02-04 MED ORDER — APIXABAN 5 MG PO TABS
5.0000 mg | ORAL_TABLET | Freq: Two times a day (BID) | ORAL | 1 refills | Status: DC
Start: 2023-03-02 — End: 2023-08-31

## 2023-02-04 NOTE — Progress Notes (Unsigned)
    SUBJECTIVE:   CHIEF COMPLAINT / HPI: blood clot follow-up  Diagnosed with PE in ED on 06/30. Started on Eliquis. Stopped OCPs. Noticed blood clots in urine, went to ED 07/03. Noted that she may have possibly started her menstrual cycle due to stopping OCPs.   Here for follow-up. Denies history of blood clots in her family. Has stopped taking OCPs. Is taking Eliquis. Also diagnosed with UTI, and started on Keflex.   PERTINENT  PMH / PSH: Pulmonary Embolus  OBJECTIVE:   BP 126/85   Pulse 97   Ht 5\' 3"  (1.6 m)   Wt 212 lb (96.2 kg)   LMP 02/02/2023   SpO2 99%   BMI 37.55 kg/m   General: NAD  Neuro: A&O Cardiovascular: RRR, no murmurs, no peripheral edema Respiratory: normal WOB on RA, CTAB, no wheezes, ronchi or rales Abdomen: soft, NTTP, no rebound or guarding, no flank tenderness Extremities: Moving all 4 extremities equally, no swelling   ASSESSMENT/PLAN:   Bilateral pulmonary embolism (HCC) Assessment & Plan: Discussed continuing Eliquis for a total of 3 months. No red flags to suggest clotting disorder with known inciting factor (OCP). Prescribed future Eliquis presciption required. Precautions discussed regarding falls and lacerations while on blood thinner.  Orders: -     Apixaban; Take 1 tablet (5 mg total) by mouth 2 (two) times daily.  Dispense: 60 tablet; Refill: 1  Offered other forms of contraception, patient declined at this time. Instructed to complete course of Keflex for UTI.  Precepted Dr. McDiarmid.  Return in about 2 months (around 04/07/2023).  Celine Mans, MD [facility]

## 2023-02-04 NOTE — Patient Instructions (Signed)
It was great to see you! Thank you for allowing me to participate in your care!   Our plans for today:  -Please make sure to finish the 7 days of 10 mg twice daily of Eliquis and then start 5 mg twice a day. -You will need to take Eliquis for a total of 3 months, I have sent a secondary prescription of Eliquis to start at the end of July and end at the end of September. -Please finish taking your course of Keflex for your urinary tract infection.   Please arrive 15 minutes PRIOR to your next scheduled appointment time! If you do not, this affects OTHER patients' care.  Take care and seek immediate care sooner if you develop any concerns.   Dr. Celine Mans, MD [facility]

## 2023-02-05 NOTE — Assessment & Plan Note (Addendum)
Discussed continuing Eliquis for a total of 3 months. No red flags to suggest clotting disorder with known inciting factor (OCP). Prescribed future Eliquis presciption required. Precautions discussed regarding falls and lacerations while on blood thinner.

## 2023-02-11 LAB — PROTHROMBIN GENE MUTATION

## 2023-02-16 LAB — FACTOR 5 LEIDEN

## 2023-02-20 ENCOUNTER — Encounter: Payer: Self-pay | Admitting: Family Medicine

## 2023-02-20 DIAGNOSIS — I2699 Other pulmonary embolism without acute cor pulmonale: Secondary | ICD-10-CM

## 2023-02-22 ENCOUNTER — Other Ambulatory Visit (HOSPITAL_COMMUNITY): Payer: Self-pay

## 2023-02-22 ENCOUNTER — Encounter: Payer: Self-pay | Admitting: Family Medicine

## 2023-02-22 NOTE — Telephone Encounter (Signed)
I have been unable to get a hold of anyone at [facility].   A good Rx coupon for Eliquis is ~ 500 dollars. Patient can not afford this.   Do we have any samples we could provide for her until she is able to pick up prescription on 7/31.  Will forward to pharmacy team.

## 2023-02-23 ENCOUNTER — Telehealth: Payer: Self-pay | Admitting: Family Medicine

## 2023-02-23 ENCOUNTER — Other Ambulatory Visit (HOSPITAL_COMMUNITY): Payer: Self-pay

## 2023-02-23 MED ORDER — APIXABAN 5 MG PO TABS
5.0000 mg | ORAL_TABLET | Freq: Two times a day (BID) | ORAL | 0 refills | Status: DC
Start: 2023-02-23 — End: 2023-04-23

## 2023-02-23 NOTE — Telephone Encounter (Signed)
Patient came in stating that she needs a refill for her Eliquis. She was told that she should be able to get it refilled today, but the pharmacy told her that they didn't have a refill request.

## 2023-03-08 ENCOUNTER — Ambulatory Visit: Payer: Medicaid Other | Admitting: Family Medicine

## 2023-03-09 ENCOUNTER — Ambulatory Visit: Payer: Self-pay

## 2023-03-09 NOTE — Telephone Encounter (Signed)
Pt states that she is on Eliquis blood thinners and is wanting to know if it is ok for her to get a wax tomorrow. Please advise.    Chief Complaint: Asking if she be waxed while on Eliquis. Instructed to call her PCP or pharmacist.  Symptoms: n/a Frequency: n/a Pertinent Negatives: Patient denies  Disposition: [] ED /[] Urgent Care (no appt availability in office) / [] Appointment(In office/virtual)/ []  [facility]/ [] Home Care/ [] Refused Recommended Disposition /[] [place] [facility]/ [x]  Follow-up with PCP Additional Notes: Verbalizes understanding.  Reason for Disposition  [1] Caller has URGENT medicine question about med that PCP or specialist prescribed AND [2] triager unable to answer question  Answer Assessment - Initial Assessment Questions 1. NAME of MEDICINE: "What medicine(s) are you calling about?"     Eliquis 2. QUESTION: "What is your question?" (e.g., double dose of medicine, side effect)     Can I get waxed on blood thinner? 3. PRESCRIBER: "Who prescribed the medicine?" Reason: if prescribed by specialist, call should be referred to that group.      4. SYMPTOMS: "Do you have any symptoms?" If Yes, ask: "What symptoms are you having?"  "How bad are the symptoms (e.g., mild, moderate, severe)     No 5. PREGNANCY:  "Is there any chance that you are pregnant?" "When was your last menstrual period?"     No  Protocols used: Medication Question Call-A-AH

## 2023-03-14 ENCOUNTER — Encounter: Payer: Self-pay | Admitting: Family Medicine

## 2023-03-14 ENCOUNTER — Ambulatory Visit: Payer: Medicaid Other | Admitting: Family Medicine

## 2023-03-14 ENCOUNTER — Other Ambulatory Visit (HOSPITAL_COMMUNITY)
Admission: RE | Admit: 2023-03-14 | Discharge: 2023-03-14 | Disposition: A | Discharge status: Home / Self Care | Payer: Medicaid Other | Source: Ambulatory Visit | Attending: Family Medicine | Admitting: Family Medicine

## 2023-03-14 VITALS — BP 106/85 | HR 93 | Ht 63.0 in | Wt 213.5 lb

## 2023-03-14 DIAGNOSIS — I2699 Other pulmonary embolism without acute cor pulmonale: Secondary | ICD-10-CM

## 2023-03-14 DIAGNOSIS — Z0000 Encounter for general adult medical examination without abnormal findings: Secondary | ICD-10-CM | POA: Diagnosis not present

## 2023-03-14 DIAGNOSIS — N62 Hypertrophy of breast: Secondary | ICD-10-CM | POA: Diagnosis not present

## 2023-03-14 NOTE — Patient Instructions (Addendum)
Thank you for visiting clinic today - it is always great to see you!  Today we discussed how you've been doing since having the pulmonary embolism (PE) episode. We're so glad to hear that you have been feeling well. Please continue to take your blood thinner as prescribed and monitor for signs of difficulty breathing, chest pain, or coughing up blood. If you notice these signs, please go to the emergency room immediately.   Otherwise, we also completed a routine STI/STD screening today including blood work. We also completed a routine pap smear.   Please schedule an appointment for clinic follow up as needed.   Reach out to our clinic with any questions or concerns you may have - we are here for you.  Ivery Quale, MD

## 2023-03-14 NOTE — Progress Notes (Signed)
    SUBJECTIVE:   CHIEF COMPLAINT / HPI:   Bilateral PE Patient has been doing better since her ED visit last month for bilateral PE. She stopped her OCP and has been taking daily eliquis since. No recent SOB, CP, or cough productive of blood.   Health maintenance Patient here for routine STI/STD testing today. No recent vaginal discharge changes, odor, itching, or pain. No dysuria or other urine changes. Last sexual encounter in May, no condom use. Patient also due for pap smear today.   Macromastia Patient would like to discuss breat reduction options.   PERTINENT  PMH / PSH: bilateral PE   OBJECTIVE:   BP 106/85   Pulse 93   Ht 5\' 3"  (1.6 m)   Wt 213 lb 8 oz (96.8 kg)   LMP 03/02/2023   SpO2 99%   BMI 37.82 kg/m   General: Well-appearing, no acute distress CV: Normal S1/S2. No extra heart sounds. Warm and well-perfused. Pulm: Breathing comfortably on room air. CTAB. No increased WOB. Cervical: Normal appearing cervix. Normal appearing discharge. No lesions noted.  Skin: Warm, dry.  ASSESSMENT/PLAN:   Bilateral pulmonary embolism (HCC) - Clinically stable today - Cont daily eliquis as prescribed - Follow up scheduled with [facility] in September after 67mo course of eliquis    Health maintenance - STI/STD testing collected. - Pap smear collected.  Macromastia - Referral placed for plastic surgery.  Patient scheduled to follow up in clinic on 04/05/23 for reassessment following 67mo course of eliquis for bilateral PE.  Ivery Quale, MD [facility]

## 2023-03-14 NOTE — Assessment & Plan Note (Signed)
-   Clinically stable today - Cont daily eliquis as prescribed - Follow up scheduled with [facility] in September after 54mo course of eliquis

## 2023-03-15 ENCOUNTER — Encounter: Payer: Self-pay | Admitting: Family Medicine

## 2023-03-30 ENCOUNTER — Encounter: Payer: Self-pay | Admitting: Plastic Surgery

## 2023-03-30 ENCOUNTER — Ambulatory Visit (INDEPENDENT_AMBULATORY_CARE_PROVIDER_SITE_OTHER): Payer: Medicaid Other | Admitting: Plastic Surgery

## 2023-03-30 VITALS — BP 112/76 | HR 136 | Ht 63.0 in | Wt 212.8 lb

## 2023-03-30 DIAGNOSIS — I2699 Other pulmonary embolism without acute cor pulmonale: Secondary | ICD-10-CM

## 2023-03-30 DIAGNOSIS — Z6837 Body mass index (BMI) 37.0-37.9, adult: Secondary | ICD-10-CM | POA: Diagnosis not present

## 2023-03-30 DIAGNOSIS — N62 Hypertrophy of breast: Secondary | ICD-10-CM

## 2023-03-30 DIAGNOSIS — M542 Cervicalgia: Secondary | ICD-10-CM | POA: Diagnosis not present

## 2023-03-30 DIAGNOSIS — M546 Pain in thoracic spine: Secondary | ICD-10-CM | POA: Diagnosis not present

## 2023-03-30 DIAGNOSIS — Z7901 Long term (current) use of anticoagulants: Secondary | ICD-10-CM | POA: Diagnosis not present

## 2023-03-30 NOTE — Progress Notes (Signed)
Referring Provider Lockie Mola, MD [address] [place],  [place] [address]   CC:  Chief Complaint  Patient presents with   Cosult   Consult      Angela Johnston is an 22 y.o. female.  HPI: [name] is a 22 year old female who presents today with complaints of upper back and neck pain as well as chest wall pain which she feels is due to the large size of her breast.  She states that she has had this pain for many years.  She was seen 3 years ago by plastic surgeon regarding a possible breast reduction.  She was felt to be a good candidate at that time.  She feels that her breast have continued to grow in size since she was last seen.  She would like to proceed with a bilateral breast reduction.  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  She is currently still on Eliquis for her DVT.  Allergies  Allergen Reactions   Junel Fe 1.5-30 [Norethin Ace-Eth Estrad-Fe] Other (See Comments)    Had bilateral PE while taking OCP    Outpatient Encounter Medications as of 03/30/2023  Medication Sig   apixaban (ELIQUIS) 5 MG TABS tablet Take 1 tablet (5 mg total) by mouth 2 (two) times daily.   apixaban (ELIQUIS) 5 MG TABS tablet Take 1 tablet (5 mg total) by mouth 2 (two) times daily.   APIXABAN (ELIQUIS) VTE STARTER PACK (10MG  AND 5MG ) Take as directed on package: start with two-5mg  tablets twice daily for 7 days. On day 8, switch to one-5mg  tablet twice daily.   FLUoxetine (PROZAC) 40 MG capsule Take 1 capsule (40 mg total) by mouth daily. (Patient not taking: Reported on 03/30/2023)   No facility-administered encounter medications on file as of 03/30/2023.     Past Medical History:  Diagnosis Date   History of postpartum hypertension 09/30/2020   Maternal varicella, non-immune 06/19/2020   Medical history non-contributory    Sickle cell trait (HCC) 07/16/2020   Noted on Hgb  electrophoresis this pregnancy    Past Surgical History:  Procedure Laterality Date   CESAREAN SECTION N/A 09/21/2020   Procedure: CESAREAN SECTION;  Surgeon: Adam Phenix, MD;  Location: [facility] LD ORS;  Service: Obstetrics;  Laterality: N/A;   NO PAST SURGERIES     WISDOM TOOTH EXTRACTION      Family History  Problem Relation Age of Onset   Healthy Mother    Healthy Father    Heart murmur Maternal Grandmother     Social History   Social History Narrative   Not on file     Review of Systems General: Denies fevers, chills, weight loss CV: Denies chest pain, shortness of breath, palpitations Breast: Patient feels that the large size of her breast are contributing to her upper back and neck pain.  Physical Exam    03/30/2023   10:50 AM 03/14/2023   11:02 AM 02/04/2023    4:30 PM  Vitals with BMI  Height 5\' 3"  5\' 3"    Weight 212 lbs 13 oz 213 lbs 8 oz   BMI 37.71 37.83   Systolic 112 106   Diastolic 76 85   Pulse 136 93 97    General:  No acute distress,  Alert and oriented, Non-Toxic, Normal speech and affect Breast: Patient has extremely large breast with grade  3 ptosis.  There are no dominant masses on physical exam and the nipples are normal in appearance.  There is no evidence of nipple discharge.  The sternal notch to nipple distance on the right is 40 cm and 40 cm on the left the fold to nipple distance on the right is 27 cm and 27 cm on the left. Mammogram: Not applicable due to age Assessment/Plan Macromastia: Patient has extremely large breasts and would likely benefit from bilateral breast reduction.  I believe that I could remove up to 1000 g per breast.  I discussed breast reductions at length with the patient including the location of the incisions and the unpredictable nature of scarring and wound healing.  We discussed the risks of bleeding, infection, seroma formation.  She understands I will use drains postoperatively.  We discussed the risk of nipple loss due  to nipple ischemia.  We discussed the postoperative restrictions including no heavy lifting greater than 20 pounds no vigorous activity, and no submerging the incisions in water for 6 weeks.  She understands that she will need to wear a compressive garment for 6 weeks postoperatively.  The patient is currently on Eliquis for DVT and PE.  She understands that under no circumstance will I perform a breast reduction while she is still anticoagulated.  Additionally if she is felt to be at too high risk to undergo breast reduction without the use of perioperative anticoagulation she is also not a candidate for a bilateral breast reduction.  This is due to the significant risk of bleeding and her already high risk for wound healing and nipple ischemia problems due to the large size of her breast.  All questions were answered to the patient's satisfaction.  Photographs were obtained today with her consent.  Will obtain medical clearance for her to be off of all anticoagulation for surgery.  Will schedule her for a bilateral breast reduction if this is possible.  [name] 03/30/2023, 12:34 PM

## 2023-04-01 ENCOUNTER — Telehealth: Payer: Self-pay | Admitting: *Deleted

## 2023-04-01 NOTE — Telephone Encounter (Addendum)
Surgery Clearance filled out for the Eliquis.  Faxed to Dr. Celine Mans at [facility].  Clearance given to [facility].  Waiting on response.//AB/CMA

## 2023-04-04 NOTE — Progress Notes (Unsigned)
    SUBJECTIVE:   CHIEF COMPLAINT / HPI: pulmonary embolism follow-up  Pulmonary emboli On Eliquis PE diagnosed 01/30/23 Stopped OCPs Taking 5mg  BID States she has roughly 60 pills to finish total of 3 month treatment Undergoing surgery clearance for breast reduction No difficulty breathing  Sleep issues Ongoing for years Difficulty falling asleep Taking melatonin Watches tv before going to sleep Goes to sleep at different times, tries to sleep at 11:30pm No issues staying asleep Sleeping for at least 8hrs Not hard to wake up  PERTINENT  PMH / PSH: Hx of Bilateral PE  OBJECTIVE:   BP 109/74   Pulse 94   Ht 5\' 3"  (1.6 m)   Wt 216 lb 8 oz (98.2 kg)   LMP 03/02/2023   SpO2 100%   BMI 38.35 kg/m  General: NAD, well appearing Neuro: A&O Respiratory: normal WOB on RA Extremities: Moving all 4 extremities equally   ASSESSMENT/PLAN:   Bilateral pulmonary embolism (HCC) Assessment & Plan: Stable.  Continue Eliquis until September 30 first, 2024.  Counseled on patient needs to finish treatment for pulmonary embolism before pursuing breast reduction surgery.   Difficulty falling asleep at night until early morning hours Assessment & Plan: Counseled on proper sleep hygiene.  Patient may continue to use melatonin if she feels that is helping.  Provided handout.  Follow-up if difficulty sleeping not improving.  PHQ-9 reviewed, negative question 9.  Do not suspect mood is contributing to sleep difficulty.    Return if symptoms worsen or fail to improve.  Celine Mans, MD [facility]

## 2023-04-05 ENCOUNTER — Encounter: Payer: Self-pay | Admitting: Family Medicine

## 2023-04-05 ENCOUNTER — Telehealth: Payer: Self-pay | Admitting: Plastic Surgery

## 2023-04-05 ENCOUNTER — Ambulatory Visit (INDEPENDENT_AMBULATORY_CARE_PROVIDER_SITE_OTHER): Payer: Medicaid Other | Admitting: Family Medicine

## 2023-04-05 VITALS — BP 109/74 | HR 94 | Ht 63.0 in | Wt 216.5 lb

## 2023-04-05 DIAGNOSIS — I2699 Other pulmonary embolism without acute cor pulmonale: Secondary | ICD-10-CM

## 2023-04-05 DIAGNOSIS — G4700 Insomnia, unspecified: Secondary | ICD-10-CM | POA: Diagnosis not present

## 2023-04-05 NOTE — Telephone Encounter (Signed)
Patient called inquiring the status of her surgery.  She saw her PCP today and Eliquis at the end of September. She wanted to know if you had heard back from her PCP.  Please call her at [phone]

## 2023-04-05 NOTE — Patient Instructions (Addendum)
It was great to see you! Thank you for allowing me to participate in your care!  Our plans for today:  - Please continue taking your Eliquis. - I have provided a video on sleep hygiene.    Please arrive 15 minutes PRIOR to your next scheduled appointment time! If you do not, this affects OTHER patients' care.  Take care and seek immediate care sooner if you develop any concerns.   Celine Mans, MD, PGY-2 [facility] 10:21 AM 04/05/2023  [facility]

## 2023-04-05 NOTE — Assessment & Plan Note (Signed)
Stable.  Continue Eliquis until September 30 first, 2024.  Counseled on patient needs to finish treatment for pulmonary embolism before pursuing breast reduction surgery.

## 2023-04-05 NOTE — Assessment & Plan Note (Addendum)
Counseled on proper sleep hygiene.  Patient may continue to use melatonin if she feels that is helping.  Provided handout.  Follow-up if difficulty sleeping not improving.  PHQ-9 reviewed, negative question 9.  Do not suspect mood is contributing to sleep difficulty.

## 2023-04-19 ENCOUNTER — Encounter: Payer: Self-pay | Admitting: Family Medicine

## 2023-04-23 ENCOUNTER — Other Ambulatory Visit: Payer: Self-pay | Admitting: Family Medicine

## 2023-04-23 DIAGNOSIS — I2699 Other pulmonary embolism without acute cor pulmonale: Secondary | ICD-10-CM

## 2023-04-25 ENCOUNTER — Encounter: Payer: Self-pay | Admitting: Family Medicine

## 2023-04-25 MED ORDER — APIXABAN 5 MG PO TABS
5.0000 mg | ORAL_TABLET | Freq: Two times a day (BID) | ORAL | 0 refills | Status: DC
Start: 2023-04-25 — End: 2023-08-31

## 2023-05-01 ENCOUNTER — Encounter: Payer: Self-pay | Admitting: Family Medicine

## 2023-05-03 NOTE — Telephone Encounter (Signed)
Patient calls nurse line requesting to speak with PCP.  She reports anxiety stopping Eliquis treatment. She reports her last day was 9/30.   Advised PCP can send a mychart message in regards to anxieties to ease her mind for completed treatment.

## 2023-05-21 ENCOUNTER — Encounter (HOSPITAL_COMMUNITY): Payer: Self-pay

## 2023-05-21 ENCOUNTER — Other Ambulatory Visit: Payer: Self-pay

## 2023-05-21 ENCOUNTER — Emergency Department (HOSPITAL_COMMUNITY)
Admission: EM | Admit: 2023-05-21 | Discharge: 2023-05-21 | Discharge status: Against Medical Advice | Payer: Medicaid Other | Attending: Emergency Medicine | Admitting: Emergency Medicine

## 2023-05-21 DIAGNOSIS — R519 Headache, unspecified: Secondary | ICD-10-CM | POA: Diagnosis not present

## 2023-05-21 DIAGNOSIS — Z5321 Procedure and treatment not carried out due to patient leaving prior to being seen by health care provider: Secondary | ICD-10-CM | POA: Diagnosis not present

## 2023-05-21 NOTE — ED Provider Triage Note (Signed)
Emergency Medicine Provider Triage Evaluation Note  Angela Johnston , a 22 y.o. female  was evaluated in triage.  Pt complains of Dull headache.  Review of Systems  Positive: Head pressure when she turns her neck Negative: Visual change, tinnitus, dizziness, nausea  Physical Exam  BP 122/77 (BP Location: Right Arm)   Pulse 75   Temp 98.9 F (37.2 C) (Oral)   Resp 16   Ht 5\' 3"  (1.6 m)   Wt 98 kg   LMP 05/06/2023   SpO2 100%   BMI 38.26 kg/m  Gen:   Awake, no distress   Resp:  Normal effort  MSK:   Moves extremities without difficulty  Other:  Neuro exam limited in Triage vertical bed - no obvious neuro deficit  Medical Decision Making  Medically screening exam initiated at 2:48 PM.  Appropriate orders placed.  Angela Johnston was informed that the remainder of the evaluation will be completed by another provider, this initial triage assessment does not replace that evaluation, and the importance of remaining in the ED until their evaluation is complete.  Symptoms x 2 days of right sided head pressure when she turns her neck. No fever, other neuro symptoms, congestion, recent illness. Needs full neuro exam   Elpidio Anis, PA-C 05/21/23 1451

## 2023-05-21 NOTE — ED Notes (Signed)
Pt was called to go to a room. Was not present in the waiting room. Will call again.

## 2023-05-21 NOTE — ED Triage Notes (Signed)
Pt comes in with complaints of headache starting yesterday starting suddenly. Pt states when she tilts her head to the rt side it hurts worse. Pt states "like someone is squeezing" my brain.

## 2023-05-22 ENCOUNTER — Emergency Department (HOSPITAL_BASED_OUTPATIENT_CLINIC_OR_DEPARTMENT_OTHER): Payer: Medicaid Other

## 2023-05-22 ENCOUNTER — Encounter (HOSPITAL_BASED_OUTPATIENT_CLINIC_OR_DEPARTMENT_OTHER): Payer: Self-pay | Admitting: Emergency Medicine

## 2023-05-22 ENCOUNTER — Other Ambulatory Visit: Payer: Self-pay

## 2023-05-22 ENCOUNTER — Emergency Department (HOSPITAL_BASED_OUTPATIENT_CLINIC_OR_DEPARTMENT_OTHER)
Admission: EM | Admit: 2023-05-22 | Discharge: 2023-05-23 | Disposition: A | Discharge status: Home / Self Care | Payer: Medicaid Other | Attending: Emergency Medicine | Admitting: Emergency Medicine

## 2023-05-22 DIAGNOSIS — R202 Paresthesia of skin: Secondary | ICD-10-CM | POA: Diagnosis not present

## 2023-05-22 DIAGNOSIS — Z7901 Long term (current) use of anticoagulants: Secondary | ICD-10-CM | POA: Diagnosis not present

## 2023-05-22 DIAGNOSIS — R200 Anesthesia of skin: Secondary | ICD-10-CM | POA: Insufficient documentation

## 2023-05-22 DIAGNOSIS — R531 Weakness: Secondary | ICD-10-CM | POA: Diagnosis not present

## 2023-05-22 DIAGNOSIS — R519 Headache, unspecified: Secondary | ICD-10-CM | POA: Insufficient documentation

## 2023-05-22 HISTORY — DX: Other pulmonary embolism without acute cor pulmonale: I26.99

## 2023-05-22 LAB — BASIC METABOLIC PANEL
Anion gap: 11 (ref 5–15)
BUN: 8 mg/dL (ref 6–20)
CO2: 23 mmol/L (ref 22–32)
Calcium: 8.9 mg/dL (ref 8.9–10.3)
Chloride: 103 mmol/L (ref 98–111)
Creatinine, Ser: 0.95 mg/dL (ref 0.44–1.00)
GFR, Estimated: >60 mL/min (ref >60)
Glucose, Bld: 120 mg/dL — ABNORMAL HIGH (ref 70–99)
Potassium: 3.6 mmol/L (ref 3.5–5.1)
Sodium: 137 mmol/L (ref 135–145)

## 2023-05-22 LAB — CBC
HCT: 32.4 % — ABNORMAL LOW (ref 36.0–46.0)
Hemoglobin: 10.4 g/dL — ABNORMAL LOW (ref 12.0–15.0)
MCH: 24.5 pg — ABNORMAL LOW (ref 26.0–34.0)
MCHC: 32.1 g/dL (ref 30.0–36.0)
MCV: 76.4 fL — ABNORMAL LOW (ref 80.0–100.0)
Platelets: 297 10*3/uL (ref 150–400)
RBC: 4.24 MIL/uL (ref 3.87–5.11)
RDW: 14.5 % (ref 11.5–15.5)
WBC: 5.9 10*3/uL (ref 4.0–10.5)
nRBC: 0 % (ref 0.0–0.2)

## 2023-05-22 LAB — HCG, SERUM, QUALITATIVE: Preg, Serum: NEGATIVE

## 2023-05-22 MED ORDER — IOHEXOL 350 MG/ML SOLN
75.0000 mL | Freq: Once | INTRAVENOUS | Status: AC | PRN
  Administered 2023-05-22: 75 mL via INTRAVENOUS

## 2023-05-22 NOTE — ED Notes (Signed)
Dr Rubin PayorPickering in to assess

## 2023-05-22 NOTE — Discharge Instructions (Addendum)
MRIs today did not show any acute findings. You can follow-up with neurology if symptoms persist. Recommend to see your primary care doctor in the interim. Return here for new concerns.

## 2023-05-22 NOTE — ED Notes (Signed)
Transfer to [facility] ED for MRI discussed with patient. Pt aware she is to go directly to the ED without stopping or eating. She denies pain or distress at this time.

## 2023-05-22 NOTE — ED Provider Notes (Signed)
[facility] Provider Note   CSN: [id number] Arrival date & time: 05/22/23  1803     History  Chief Complaint  Patient presents with   Extremity Weakness    Angela Johnston is a 22 y.o. female.   Extremity Weakness  Patient presents with headache.  Began yesterday.  States feels as if her head is sloshing around when she moves her head.  States her left upper extremity is not as strong as the right side.  No neck pain.  States also feels as if the sensation is a little different on the left upper extremity.  No lower extremity problems.  Has not had headaches with this before.  No vision changes.  Of note did have a previous pulmonary embolism.  Thought to be due to her birth control.  No longer on the birth control but also recently finished up the anticoagulation after a 79-month course.  I reviewed oncology notes and hypercoagulable workup had been done without clear cause.    Past Medical History:  Diagnosis Date   History of postpartum hypertension 09/30/2020   Maternal varicella, non-immune 06/19/2020   Medical history non-contributory    Pulmonary embolism (HCC)    Sickle cell trait (HCC) 07/16/2020   Noted on Hgb electrophoresis this pregnancy    Home Medications Prior to Admission medications   Medication Sig Start Date End Date Taking? Authorizing Provider  apixaban (ELIQUIS) 5 MG TABS tablet Take 1 tablet (5 mg total) by mouth 2 (two) times daily. 03/02/23 05/01/23  Celine Mans, MD  apixaban (ELIQUIS) 5 MG TABS tablet Take 1 tablet (5 mg total) by mouth 2 (two) times daily. 04/25/23   Lockie Mola, MD  APIXABAN Everlene Balls) VTE STARTER PACK (10MG  AND 5MG ) Take as directed on package: start with two-5mg  tablets twice daily for 7 days. On day 8, switch to one-5mg  tablet twice daily. 01/31/23   Rancour, Jeannett Senior, MD  FLUoxetine (PROZAC) 40 MG capsule Take 1 capsule (40 mg total) by mouth daily. Patient not taking: Reported  on 03/30/2023 05/27/22   Shelby Mattocks, DO      Allergies    Junel fe 1.5-30 [norethin ace-eth estrad-fe]    Review of Systems   Review of Systems  Musculoskeletal:  Positive for extremity weakness.    Physical Exam Updated Vital Signs BP 115/74 (BP Location: Left Arm)   Pulse 93   Temp 98 F (36.7 C)   Resp 20   LMP 05/06/2023   SpO2 100%  Physical Exam Vitals and nursing note reviewed.  Skin:    Capillary Refill: Capillary refill takes less than 2 seconds.  Neurological:     Mental Status: She is alert and oriented to person, place, and time.     Comments: Finger-nose intact.  Face symmetric.  Eye movements intact.  Grip strength good bilaterally.  Sensation grossly intact upper extremities.     ED Results / Procedures / Treatments   Labs (all labs ordered are listed, but only abnormal results are displayed) Labs Reviewed  BASIC METABOLIC PANEL - Abnormal; Notable for the following components:      Result Value   Glucose, Bld 120 (*)    All other components within normal limits  CBC - Abnormal; Notable for the following components:   Hemoglobin 10.4 (*)    HCT 32.4 (*)    MCV 76.4 (*)    MCH 24.5 (*)    All other components within normal limits  HCG, SERUM,  QUALITATIVE    EKG None  Radiology CT ANGIO HEAD NECK W WO CM  Result Date: 05/22/2023 CLINICAL DATA:  Headache since yesterday, heavy feeling in left upper extremity with decreased sensation EXAM: CT ANGIOGRAPHY HEAD AND NECK WITH AND WITHOUT CONTRAST TECHNIQUE: Multidetector CT imaging of the head and neck was performed using the standard protocol during bolus administration of intravenous contrast. Multiplanar CT image reconstructions and MIPs were obtained to evaluate the vascular anatomy. Carotid stenosis measurements (when applicable) are obtained utilizing NASCET criteria, using the distal internal carotid diameter as the denominator. RADIATION DOSE REDUCTION: This exam was performed according to the  departmental dose-optimization program which includes automated exposure control, adjustment of the mA and/or kV according to patient size and/or use of iterative reconstruction technique. CONTRAST:  75mL OMNIPAQUE IOHEXOL 350 MG/ML SOLN COMPARISON:  No prior CTA available, correlation is made with MRI head 03/22/2020 FINDINGS: CT HEAD FINDINGS Brain: No evidence of acute infarct, hemorrhage, mass, mass effect, or midline shift. No hydrocephalus or extra-axial fluid collection. Vascular: No hyperdense vessel. Skull: Negative for fracture or focal lesion. Sinuses/Orbits: No acute finding. Other: The mastoid air cells are well aerated. CTA NECK FINDINGS Aortic arch: Two-vessel arch with a common origin of the brachiocephalic and left common carotid arteries. Imaged portion shows no evidence of aneurysm or dissection. No significant stenosis of the major arch vessel origins. Right carotid system: No evidence of stenosis, dissection, or occlusion. Left carotid system: No evidence of stenosis, dissection, or occlusion. Vertebral arteries: No evidence of stenosis, dissection, or occlusion. Skeleton: No acute osseous abnormality. No significant degenerative changes in the cervical spine. Other neck: No acute finding. Upper chest: No focal pulmonary opacity or pleural effusion. Review of the MIP images confirms the above findings CTA HEAD FINDINGS Anterior circulation: Both internal carotid arteries are patent to the termini, without significant stenosis. A1 segments patent. No evidence of anterior communicating artery aneurysm. Triple A2, a normal variant. Anterior cerebral arteries are patent to their distal aspects without significant stenosis. No M1 stenosis or occlusion. MCA branches perfused to their distal aspects without significant stenosis. Posterior circulation: Vertebral arteries patent to the vertebrobasilar junction without significant stenosis. Posterior inferior cerebellar arteries patent proximally, with a  common origin of the left AICA and PICA. Basilar patent to its distal aspect without significant stenosis. Superior cerebellar arteries patent proximally. Patent P1 segments. PCAs perfused to their distal aspects without significant stenosis. The bilateral posterior communicating arteries are patent. Venous sinuses: Well opacified, patent. Anatomic variants: Triple A2. Common origin of the left AICA and PICA. No evidence of aneurysm or vascular malformation. Review of the MIP images confirms the above findings IMPRESSION: 1. No acute intracranial process. 2. No intracranial large vessel occlusion or significant stenosis. 3. No hemodynamically significant stenosis in the neck. Electronically Signed   By: Wiliam Ke M.D.   On: 05/22/2023 22:35    Procedures Procedures    Medications Ordered in ED Medications  iohexol (OMNIPAQUE) 350 MG/ML injection 75 mL (75 mLs Intravenous Contrast Given 05/22/23 2038)    ED Course/ Medical Decision Making/ A&P                                 Medical Decision Making Amount and/or Complexity of Data Reviewed Labs: ordered. Radiology: ordered.  Risk Prescription drug management.   Patient with headache and potential neurologic deficits.  Exam does not show deficits but patient states she feels the  arm does not feel the same as the other side.  Patient is higher risk due to previous blood clots although thought to be related to her birth control pill.  Patient no longer on blood thinners are no longer on the birth control pill.  Not a TNK candidate due to 2 days of symptoms. Differential diagnosis also includes other cause such as vasculitis or MS. Discussed curbside with Dr. Marin Roberts from neurology.  Being higher risk will need MRI shorter-term.    Will need brain and cervical spine with and without.  Discussed with Dr. Eudelia Bunch who accepted in transfer.           Final Clinical Impression(s) / ED Diagnoses Final diagnoses:   Nonintractable headache, unspecified chronicity pattern, unspecified headache type  Arm numbness left    Rx / DC Orders ED Discharge Orders     None         Benjiman Core, MD 05/22/23 [phone]

## 2023-05-22 NOTE — ED Triage Notes (Addendum)
Pt c/o HA since yesterday; today since 4p, feels like LUE is heavy and won't work; pt drove herself here; decreased sensation to LUE, sts feels numb

## 2023-05-23 ENCOUNTER — Emergency Department (HOSPITAL_COMMUNITY): Payer: Medicaid Other

## 2023-05-23 MED ORDER — GADOBUTROL 1 MMOL/ML IV SOLN
10.0000 mL | Freq: Once | INTRAVENOUS | Status: AC | PRN
  Administered 2023-05-23: 10 mL via INTRAVENOUS

## 2023-05-23 NOTE — ED Provider Notes (Signed)
Results for orders placed or performed during the hospital encounter of 05/22/23  Basic metabolic panel  Result Value Ref Range   Sodium 137 135 - 145 mmol/L   Potassium 3.6 3.5 - 5.1 mmol/L   Chloride 103 98 - 111 mmol/L   CO2 23 22 - 32 mmol/L   Glucose, Bld 120 (H) 70 - 99 mg/dL   BUN 8 6 - 20 mg/dL   Creatinine, Ser 2.95 0.44 - 1.00 mg/dL   Calcium 8.9 8.9 - 62.1 mg/dL   GFR, Estimated >30 >86 mL/min   Anion gap 11 5 - 15  CBC  Result Value Ref Range   WBC 5.9 4.0 - 10.5 K/uL   RBC 4.24 3.87 - 5.11 MIL/uL   Hemoglobin 10.4 (L) 12.0 - 15.0 g/dL   HCT 57.8 (L) 46.9 - 62.9 %   MCV 76.4 (L) 80.0 - 100.0 fL   MCH 24.5 (L) 26.0 - 34.0 pg   MCHC 32.1 30.0 - 36.0 g/dL   RDW 52.8 41.3 - 24.4 %   Platelets 297 150 - 400 K/uL   nRBC 0.0 0.0 - 0.2 %  Pregnancy serum, qualitative  Result Value Ref Range   Preg, Serum NEGATIVE NEGATIVE   MR Brain W and Wo Contrast  Result Date: 05/23/2023 CLINICAL DATA:  Left upper extremity weakness EXAM: MRI HEAD WITHOUT AND WITH CONTRAST MRI CERVICAL SPINE WITHOUT AND WITH CONTRAST TECHNIQUE: Multiplanar, multiecho pulse sequences of the brain and surrounding structures, and cervical spine, to include the craniocervical junction and cervicothoracic junction, were obtained without and with intravenous contrast. CONTRAST:  10mL GADAVIST GADOBUTROL 1 MMOL/ML IV SOLN COMPARISON:  03/22/2020 MRI head; no prior MRI cervical spine available, correlation is made with CTA head and neck 05/22/2023 FINDINGS: MRI HEAD FINDINGS Brain: No restricted diffusion to suggest acute or subacute infarct. No abnormal parenchymal or meningeal enhancement. No abnormal T2 hyperintense lesions in the periventricular, juxtacortical, or infratentorial white matter. No acute hemorrhage, mass, mass effect, or midline shift. No hydrocephalus or extra-axial collection. The previously described hypoenhancing focus in the pituitary gland is not appreciated on this study. Low lying  cerebellar tonsils, which extend 2 mm below the foramen magnum and do not meet criteria for Chiari I malformation. Vascular: Normal arterial flow voids. Normal arterial and venous enhancement. Skull and upper cervical spine: Normal marrow signal. Sinuses/Orbits: Mucous retention cysts in the right maxillary sinus. Otherwise clear paranasal sinuses. No acute finding in the orbits. MRI CERVICAL SPINE FINDINGS Alignment: Physiologic. Vertebrae: No fracture, evidence of discitis, or bone lesion. No abnormal enhancement. Cord: Normal signal and morphology.  No abnormal enhancement. Posterior Fossa, vertebral arteries, paraspinal tissues: Negative. Disc levels: C2-C3: No significant disc bulge. No spinal canal stenosis or neuroforaminal narrowing. C3-C4: No significant disc bulge. No spinal canal stenosis or neuroforaminal narrowing. C4-C5: No significant disc bulge. No spinal canal stenosis or neuroforaminal narrowing. C5-C6: No significant disc bulge. No spinal canal stenosis or neuroforaminal narrowing. C6-C7: Small central disc protrusion with annular fissure. No spinal canal stenosis or neural foraminal narrowing. C7-T1: No significant disc bulge. No spinal canal stenosis or neuroforaminal narrowing. IMPRESSION: 1. No acute intracranial process. No evidence of acute or subacute infarct. 2. No abnormal T2 hyperintense lesions in the brain or cervical spinal cord to suggest demyelinating disease. 3. Small central disc protrusion with annular fissure at C6-C7, which cannot area Tate adjacent structures. No spinal canal stenosis or neural foraminal narrowing. Electronically Signed   By: Wiliam Ke M.D.   On: 05/23/2023  02:44   MR Cervical Spine W and Wo Contrast  Result Date: 05/23/2023 CLINICAL DATA:  Left upper extremity weakness EXAM: MRI HEAD WITHOUT AND WITH CONTRAST MRI CERVICAL SPINE WITHOUT AND WITH CONTRAST TECHNIQUE: Multiplanar, multiecho pulse sequences of the brain and surrounding structures, and  cervical spine, to include the craniocervical junction and cervicothoracic junction, were obtained without and with intravenous contrast. CONTRAST:  10mL GADAVIST GADOBUTROL 1 MMOL/ML IV SOLN COMPARISON:  03/22/2020 MRI head; no prior MRI cervical spine available, correlation is made with CTA head and neck 05/22/2023 FINDINGS: MRI HEAD FINDINGS Brain: No restricted diffusion to suggest acute or subacute infarct. No abnormal parenchymal or meningeal enhancement. No abnormal T2 hyperintense lesions in the periventricular, juxtacortical, or infratentorial white matter. No acute hemorrhage, mass, mass effect, or midline shift. No hydrocephalus or extra-axial collection. The previously described hypoenhancing focus in the pituitary gland is not appreciated on this study. Low lying cerebellar tonsils, which extend 2 mm below the foramen magnum and do not meet criteria for Chiari I malformation. Vascular: Normal arterial flow voids. Normal arterial and venous enhancement. Skull and upper cervical spine: Normal marrow signal. Sinuses/Orbits: Mucous retention cysts in the right maxillary sinus. Otherwise clear paranasal sinuses. No acute finding in the orbits. MRI CERVICAL SPINE FINDINGS Alignment: Physiologic. Vertebrae: No fracture, evidence of discitis, or bone lesion. No abnormal enhancement. Cord: Normal signal and morphology.  No abnormal enhancement. Posterior Fossa, vertebral arteries, paraspinal tissues: Negative. Disc levels: C2-C3: No significant disc bulge. No spinal canal stenosis or neuroforaminal narrowing. C3-C4: No significant disc bulge. No spinal canal stenosis or neuroforaminal narrowing. C4-C5: No significant disc bulge. No spinal canal stenosis or neuroforaminal narrowing. C5-C6: No significant disc bulge. No spinal canal stenosis or neuroforaminal narrowing. C6-C7: Small central disc protrusion with annular fissure. No spinal canal stenosis or neural foraminal narrowing. C7-T1: No significant disc  bulge. No spinal canal stenosis or neuroforaminal narrowing. IMPRESSION: 1. No acute intracranial process. No evidence of acute or subacute infarct. 2. No abnormal T2 hyperintense lesions in the brain or cervical spinal cord to suggest demyelinating disease. 3. Small central disc protrusion with annular fissure at C6-C7, which cannot area Tate adjacent structures. No spinal canal stenosis or neural foraminal narrowing. Electronically Signed   By: Wiliam Ke M.D.   On: 05/23/2023 02:44   CT ANGIO HEAD NECK W WO CM  Result Date: 05/22/2023 CLINICAL DATA:  Headache since yesterday, heavy feeling in left upper extremity with decreased sensation EXAM: CT ANGIOGRAPHY HEAD AND NECK WITH AND WITHOUT CONTRAST TECHNIQUE: Multidetector CT imaging of the head and neck was performed using the standard protocol during bolus administration of intravenous contrast. Multiplanar CT image reconstructions and MIPs were obtained to evaluate the vascular anatomy. Carotid stenosis measurements (when applicable) are obtained utilizing NASCET criteria, using the distal internal carotid diameter as the denominator. RADIATION DOSE REDUCTION: This exam was performed according to the departmental dose-optimization program which includes automated exposure control, adjustment of the mA and/or kV according to patient size and/or use of iterative reconstruction technique. CONTRAST:  75mL OMNIPAQUE IOHEXOL 350 MG/ML SOLN COMPARISON:  No prior CTA available, correlation is made with MRI head 03/22/2020 FINDINGS: CT HEAD FINDINGS Brain: No evidence of acute infarct, hemorrhage, mass, mass effect, or midline shift. No hydrocephalus or extra-axial fluid collection. Vascular: No hyperdense vessel. Skull: Negative for fracture or focal lesion. Sinuses/Orbits: No acute finding. Other: The mastoid air cells are well aerated. CTA NECK FINDINGS Aortic arch: Two-vessel arch with a common origin of the  brachiocephalic and left common carotid arteries.  Imaged portion shows no evidence of aneurysm or dissection. No significant stenosis of the major arch vessel origins. Right carotid system: No evidence of stenosis, dissection, or occlusion. Left carotid system: No evidence of stenosis, dissection, or occlusion. Vertebral arteries: No evidence of stenosis, dissection, or occlusion. Skeleton: No acute osseous abnormality. No significant degenerative changes in the cervical spine. Other neck: No acute finding. Upper chest: No focal pulmonary opacity or pleural effusion. Review of the MIP images confirms the above findings CTA HEAD FINDINGS Anterior circulation: Both internal carotid arteries are patent to the termini, without significant stenosis. A1 segments patent. No evidence of anterior communicating artery aneurysm. Triple A2, a normal variant. Anterior cerebral arteries are patent to their distal aspects without significant stenosis. No M1 stenosis or occlusion. MCA branches perfused to their distal aspects without significant stenosis. Posterior circulation: Vertebral arteries patent to the vertebrobasilar junction without significant stenosis. Posterior inferior cerebellar arteries patent proximally, with a common origin of the left AICA and PICA. Basilar patent to its distal aspect without significant stenosis. Superior cerebellar arteries patent proximally. Patent P1 segments. PCAs perfused to their distal aspects without significant stenosis. The bilateral posterior communicating arteries are patent. Venous sinuses: Well opacified, patent. Anatomic variants: Triple A2. Common origin of the left AICA and PICA. No evidence of aneurysm or vascular malformation. Review of the MIP images confirms the above findings IMPRESSION: 1. No acute intracranial process. 2. No intracranial large vessel occlusion or significant stenosis. 3. No hemodynamically significant stenosis in the neck. Electronically Signed   By: Wiliam Ke M.D.   On: 05/22/2023 22:35    MRI's  here without acute findings.  Patient without any noted strength deficit on re-check.  Feel she is stable for discharge.  Will refer to OP neurology.  Can return here for new concerns.   Garlon Hatchet, PA-C 05/23/23 0325    Sabas Sous, MD 05/23/23 [phone]

## 2023-08-16 ENCOUNTER — Telehealth: Payer: Self-pay

## 2023-08-16 NOTE — Telephone Encounter (Signed)
 Patient calls nurse line requesting to speak with PCP.   She reports she is interested in getting her belly button pierced. However, she reports she has hx of using blood thinners.   She reports she stopped using blood thinners ~3-4 months ago.   Advised will forward to PCP for recommendation.

## 2023-08-18 NOTE — Telephone Encounter (Signed)
Attempted to call patient to discuss.   Patients phone rang and rang and then a busy signal occurred.   If she calls back, I will be happy to discuss with her.

## 2023-08-23 ENCOUNTER — Ambulatory Visit: Payer: Medicaid Other | Admitting: Family Medicine

## 2023-08-31 ENCOUNTER — Ambulatory Visit
Admission: EM | Admit: 2023-08-31 | Discharge: 2023-08-31 | Disposition: A | Discharge status: Home / Self Care | Payer: Medicaid Other | Attending: Family Medicine | Admitting: Family Medicine

## 2023-08-31 ENCOUNTER — Encounter: Payer: Self-pay | Admitting: Emergency Medicine

## 2023-08-31 DIAGNOSIS — Z113 Encounter for screening for infections with a predominantly sexual mode of transmission: Secondary | ICD-10-CM | POA: Insufficient documentation

## 2023-08-31 DIAGNOSIS — Z3202 Encounter for pregnancy test, result negative: Secondary | ICD-10-CM | POA: Insufficient documentation

## 2023-08-31 LAB — POCT URINE PREGNANCY: Preg Test, Ur: NEGATIVE

## 2023-08-31 NOTE — ED Triage Notes (Signed)
Wants to have a pregnant test and STD test done.

## 2023-08-31 NOTE — ED Provider Notes (Signed)
[facility]    CSN: [id number] Arrival date & time: 08/31/23  1521      History   Chief Complaint No chief complaint on file.   HPI Angela Johnston is a 23 y.o. female.   Patient presenting today requesting pregnancy test and STD testing as she has recently had unprotected intercourse.  Denies known exposures to STIs, vaginal discharge, pelvic or abdominal pain, urinary symptoms.  LMP was 08/06/2023.    Past Medical History:  Diagnosis Date   History of postpartum hypertension 09/30/2020   Maternal varicella, non-immune 06/19/2020   Medical history non-contributory    Pulmonary embolism (HCC)    Sickle cell trait (HCC) 07/16/2020   Noted on Hgb electrophoresis this pregnancy    Patient Active Problem List   Diagnosis Date Noted   Difficulty falling asleep at night until early morning hours 04/05/2023   Bilateral pulmonary embolism (HCC) 02/04/2023   Mood disorder (HCC) 09/01/2021   Menstrual periods irregular 01/28/2021    Past Surgical History:  Procedure Laterality Date   CESAREAN SECTION N/A 09/21/2020   Procedure: CESAREAN SECTION;  Surgeon: Adam Phenix, MD;  Location: [facility];  Service: Obstetrics;  Laterality: N/A;   NO PAST SURGERIES     WISDOM TOOTH EXTRACTION      OB History     Gravida  1   Para  1   Term  1   Preterm      AB      Living  1      SAB      IAB      Ectopic      Multiple  0   Live Births  1            Home Medications    Prior to Admission medications   Not on File    Family History Family History  Problem Relation Age of Onset   Healthy Mother    Healthy Father    Heart murmur Maternal Grandmother     Social History Social History   Tobacco Use   Smoking status: Never   Smokeless tobacco: Never  Vaping Use   Vaping status: Never Used  Substance Use Topics   Alcohol use: Not Currently   Drug use: Not Currently     Allergies   Junel fe 1.5-30 [norethin ace-eth  estrad-fe]   Review of Systems Review of Systems Per HPI  Physical Exam Triage Vital Signs ED Triage Vitals  Encounter Vitals Group     BP 08/31/23 1554 104/68     Systolic BP Percentile --      Diastolic BP Percentile --      Pulse Rate 08/31/23 1554 85     Resp 08/31/23 1554 18     Temp 08/31/23 1554 98.3 F (36.8 C)     Temp Source 08/31/23 1554 Oral     SpO2 08/31/23 1554 98 %     Weight --      Height --      Head Circumference --      Peak Flow --      Pain Score 08/31/23 1557 0     Pain Loc --      Pain Education --      Exclude from Growth Chart --    No data found.  Updated Vital Signs BP 104/68 (BP Location: Right Arm)   Pulse 85   Temp 98.3 F (36.8 C) (Oral)   Resp 18  LMP 08/06/2023 (Exact Date)   SpO2 98%   Visual Acuity Right Eye Distance:   Left Eye Distance:   Bilateral Distance:    Right Eye Near:   Left Eye Near:    Bilateral Near:     Physical Exam Vitals and nursing note reviewed.  Constitutional:      Appearance: Normal appearance. She is not ill-appearing.  HENT:     Head: Atraumatic.  Eyes:     Extraocular Movements: Extraocular movements intact.     Conjunctiva/sclera: Conjunctivae normal.  Cardiovascular:     Rate and Rhythm: Normal rate and regular rhythm.     Heart sounds: Normal heart sounds.  Pulmonary:     Effort: Pulmonary effort is normal.     Breath sounds: Normal breath sounds.  Abdominal:     General: Bowel sounds are normal. There is no distension.     Palpations: Abdomen is soft.     Tenderness: There is no abdominal tenderness. There is no right CVA tenderness, left CVA tenderness or guarding.  Genitourinary:    Comments: GU exam deferred, self swab performed Musculoskeletal:        General: Normal range of motion.     Cervical back: Normal range of motion and neck supple.  Skin:    General: Skin is warm and dry.  Neurological:     Mental Status: She is alert and oriented to person, place, and time.   Psychiatric:        Mood and Affect: Mood normal.        Thought Content: Thought content normal.        Judgment: Judgment normal.      UC Treatments / Results  Labs (all labs ordered are listed, but only abnormal results are displayed) Labs Reviewed  POCT URINE PREGNANCY  CERVICOVAGINAL ANCILLARY ONLY    EKG   Radiology No results found.  Procedures Procedures (including critical care time)  Medications Ordered in UC Medications - No data to display  Initial Impression / Assessment and Plan / UC Course  I have reviewed the triage vital signs and the nursing notes.  Pertinent labs & imaging results that were available during my care of the patient were reviewed by me and considered in my medical decision making (see chart for details).     Vitals and exam reassuring today, urine pregnancy negative, STD swab pending.  Treat based on results.  Final Clinical Impressions(s) / UC Diagnoses   Final diagnoses:  Screening examination for STI  Negative pregnancy test   Discharge Instructions   None    ED Prescriptions   None    PDMP not reviewed this encounter.   Particia Nearing, [place] 08/31/23 [phone]

## 2023-09-01 LAB — CERVICOVAGINAL ANCILLARY ONLY
Bacterial Vaginitis (gardnerella): NEGATIVE
Chlamydia: NEGATIVE
Comment: NEGATIVE
Comment: NEGATIVE
Comment: NEGATIVE
Comment: NORMAL
Neisseria Gonorrhea: NEGATIVE
Trichomonas: NEGATIVE

## 2023-09-12 ENCOUNTER — Encounter: Payer: Self-pay | Admitting: Diagnostic Neuroimaging

## 2023-09-12 ENCOUNTER — Ambulatory Visit: Payer: Medicaid Other | Admitting: Diagnostic Neuroimaging

## 2023-09-17 ENCOUNTER — Encounter: Payer: Self-pay | Admitting: Emergency Medicine

## 2023-09-17 ENCOUNTER — Ambulatory Visit
Admission: EM | Admit: 2023-09-17 | Discharge: 2023-09-17 | Disposition: A | Discharge status: Home / Self Care | Payer: Medicaid Other | Attending: Nurse Practitioner | Admitting: Nurse Practitioner

## 2023-09-17 DIAGNOSIS — N926 Irregular menstruation, unspecified: Secondary | ICD-10-CM | POA: Diagnosis not present

## 2023-09-17 LAB — POCT URINE PREGNANCY: Preg Test, Ur: NEGATIVE

## 2023-09-17 NOTE — ED Provider Notes (Signed)
[facility]    CSN: [id number] Arrival date & time: 09/17/23  1233      History   Chief Complaint No chief complaint on file.   HPI Angela Johnston is a 23 y.o. female.   The history is provided by the patient.   Patient presents for a pregnancy test due to irregular menses.  Patient states last menstrual cycle was on 1//25.  She states that she just started having periods last year.  States that she was on birth control, but has not been on birth control for the past several months.  Patient with episode of abdominal cramping earlier today, states that has since improved.  Per review of her chart, patient with menstrual periods that are irregular.  Patient denies fever, chills, pelvic pain, or urinary symptoms.  She declines STI testing.  Past Medical History:  Diagnosis Date   History of postpartum hypertension 09/30/2020   Maternal varicella, non-immune 06/19/2020   Medical history non-contributory    Pulmonary embolism (HCC)    Sickle cell trait (HCC) 07/16/2020   Noted on Hgb electrophoresis this pregnancy    Patient Active Problem List   Diagnosis Date Noted   Difficulty falling asleep at night until early morning hours 04/05/2023   Bilateral pulmonary embolism (HCC) 02/04/2023   Mood disorder (HCC) 09/01/2021   Menstrual periods irregular 01/28/2021    Past Surgical History:  Procedure Laterality Date   CESAREAN SECTION N/A 09/21/2020   Procedure: CESAREAN SECTION;  Surgeon: Adam Phenix, MD;  Location: [facility];  Service: Obstetrics;  Laterality: N/A;   NO PAST SURGERIES     WISDOM TOOTH EXTRACTION      OB History     Gravida  1   Para  1   Term  1   Preterm      AB      Living  1      SAB      IAB      Ectopic      Multiple  0   Live Births  1            Home Medications    Prior to Admission medications   Not on File    Family History Family History  Problem Relation Age of Onset   Healthy Mother     Healthy Father    Heart murmur Maternal Grandmother     Social History Social History   Tobacco Use   Smoking status: Never   Smokeless tobacco: Never  Vaping Use   Vaping status: Never Used  Substance Use Topics   Alcohol use: Not Currently   Drug use: Not Currently     Allergies   Junel fe 1.5-30 [norethin ace-eth estrad-fe]   Review of Systems Review of Systems Per HPI  Physical Exam Triage Vital Signs ED Triage Vitals  Encounter Vitals Group     BP 09/17/23 1255 118/78     Systolic BP Percentile --      Diastolic BP Percentile --      Pulse Rate 09/17/23 1255 90     Resp 09/17/23 1255 18     Temp 09/17/23 1255 98.7 F (37.1 C)     Temp Source 09/17/23 1255 Oral     SpO2 09/17/23 1255 98 %     Weight --      Height --      Head Circumference --      Peak Flow --  Pain Score 09/17/23 1256 0     Pain Loc --      Pain Education --      Exclude from Growth Chart --    No data found.  Updated Vital Signs BP 118/78 (BP Location: Right Arm)   Pulse 90   Temp 98.7 F (37.1 C) (Oral)   Resp 18   LMP 08/06/2023 (Exact Date)   SpO2 98%   Visual Acuity Right Eye Distance:   Left Eye Distance:   Bilateral Distance:    Right Eye Near:   Left Eye Near:    Bilateral Near:     Physical Exam Vitals and nursing note reviewed.  Constitutional:      General: She is not in acute distress.    Appearance: Normal appearance.  HENT:     Head: Normocephalic.  Eyes:     Extraocular Movements: Extraocular movements intact.     Pupils: Pupils are equal, round, and reactive to light.  Pulmonary:     Effort: Pulmonary effort is normal.  Musculoskeletal:     Cervical back: Normal range of motion.  Skin:    General: Skin is warm and dry.  Neurological:     General: No focal deficit present.     Mental Status: She is alert and oriented to person, place, and time.  Psychiatric:        Mood and Affect: Mood normal.        Behavior: Behavior normal.       UC Treatments / Results  Labs (all labs ordered are listed, but only abnormal results are displayed) Labs Reviewed  POCT URINE PREGNANCY    EKG   Radiology No results found.  Procedures Procedures (including critical care time)  Medications Ordered in UC Medications - No data to display  Initial Impression / Assessment and Plan / UC Course  I have reviewed the triage vital signs and the nursing notes.  Pertinent labs & imaging results that were available during my care of the patient were reviewed by me and considered in my medical decision making (see chart for details).  Urine pregnancy test was negative.  Patient with past medical history of irregular menses.  Patient advised to follow-up with her PCP or gynecologist for further evaluation.  Patient was in agreement with this plan of care and verbalized understanding.  All questions were answered.  Patient stable for discharge.   Final Clinical Impressions(s) / UC Diagnoses   Final diagnoses:  Irregular periods     Discharge Instructions      Your pregnancy test was negative today. Recommend following up with your PCP for further evaluation and workup as discussed. Follow-up as needed.      ED Prescriptions   None    PDMP not reviewed this encounter.   Abran Cantor, NP 09/17/23 1354

## 2023-09-17 NOTE — ED Triage Notes (Signed)
States she has not has a period since January 4th.  States she did have lower abd cramping this morning. No pain at present

## 2023-09-17 NOTE — Discharge Instructions (Signed)
Your pregnancy test was negative today. Recommend following up with your PCP for further evaluation and workup as discussed. Follow-up as needed.

## 2023-09-23 ENCOUNTER — Ambulatory Visit: Payer: Medicaid Other | Admitting: Family Medicine

## 2023-10-26 ENCOUNTER — Ambulatory Visit
Admission: EM | Admit: 2023-10-26 | Discharge: 2023-10-26 | Disposition: A | Discharge status: Home / Self Care | Attending: Family Medicine | Admitting: Family Medicine

## 2023-10-26 DIAGNOSIS — L089 Local infection of the skin and subcutaneous tissue, unspecified: Secondary | ICD-10-CM | POA: Diagnosis not present

## 2023-10-26 DIAGNOSIS — L989 Disorder of the skin and subcutaneous tissue, unspecified: Secondary | ICD-10-CM

## 2023-10-26 MED ORDER — CHLORHEXIDINE GLUCONATE 4 % EX SOLN: Freq: Every day | CUTANEOUS | 0 refills | Status: DC | PRN

## 2023-10-26 MED ORDER — CEPHALEXIN 500 MG PO CAPS: 500.0000 mg | ORAL_CAPSULE | Freq: Two times a day (BID) | ORAL | 0 refills | Status: DC

## 2023-10-26 MED ORDER — MUPIROCIN 2 % EX OINT: 1.0000 | TOPICAL_OINTMENT | Freq: Two times a day (BID) | CUTANEOUS | 0 refills | Status: DC

## 2023-10-26 NOTE — ED Triage Notes (Signed)
 Pt reports spot on the heel of her right foot, x 1 year it now is causing pain x 3 days and has opened up.  States she cannot walk on it.

## 2023-10-26 NOTE — ED Provider Notes (Signed)
 [facility]    CSN: [id number] Arrival date & time: 10/26/23  1031      History   Chief Complaint No chief complaint on file.   HPI Angela Johnston is a 23 y.o. female.   Patient presenting today with 3-day history of bleeding, drainage and worsening pain in an area on the bottom of her right foot that has been present for about a year.  She states she is unsure what initially started the wound but that it has periodically hurt and opened up over time.  She states significant pain with weightbearing at this time but no known fever, chills, significant redness or swelling.  So far not tried anything over-the-counter for symptoms.  Last tetanus shot was in 2021 per chart review.    Past Medical History:  Diagnosis Date   History of postpartum hypertension 09/30/2020   Maternal varicella, non-immune 06/19/2020   Medical history non-contributory    Pulmonary embolism (HCC)    Sickle cell trait (HCC) 07/16/2020   Noted on Hgb electrophoresis this pregnancy    Patient Active Problem List   Diagnosis Date Noted   Difficulty falling asleep at night until early morning hours 04/05/2023   Bilateral pulmonary embolism (HCC) 02/04/2023   Mood disorder (HCC) 09/01/2021   Menstrual periods irregular 01/28/2021    Past Surgical History:  Procedure Laterality Date   CESAREAN SECTION N/A 09/21/2020   Procedure: CESAREAN SECTION;  Surgeon: Adam Phenix, MD;  Location: [facility];  Service: Obstetrics;  Laterality: N/A;   NO PAST SURGERIES     WISDOM TOOTH EXTRACTION      OB History     Gravida  1   Para  1   Term  1   Preterm      AB      Living  1      SAB      IAB      Ectopic      Multiple  0   Live Births  1            Home Medications    Prior to Admission medications   Medication Sig Start Date End Date Taking? Authorizing Provider  cephALEXin (KEFLEX) 500 MG capsule Take 1 capsule (500 mg total) by mouth 2 (two) times daily. 10/26/23   Yes Particia Nearing, PA-C  chlorhexidine (HIBICLENS) 4 % external liquid Apply topically daily as needed. 10/26/23  Yes Particia Nearing, PA-C  mupirocin ointment (BACTROBAN) 2 % Apply 1 Application topically 2 (two) times daily. 10/26/23  Yes Particia Nearing, PA-C    Family History Family History  Problem Relation Age of Onset   Healthy Mother    Healthy Father    Heart murmur Maternal Grandmother     Social History Social History   Tobacco Use   Smoking status: Never   Smokeless tobacco: Never  Vaping Use   Vaping status: Never Used  Substance Use Topics   Alcohol use: Not Currently   Drug use: Not Currently     Allergies   Junel fe 1.5-30 [norethin ace-eth estrad-fe]   Review of Systems Review of Systems Per HPI  Physical Exam Triage Vital Signs ED Triage Vitals  Encounter Vitals Group     BP 10/26/23 1036 119/78     Systolic BP Percentile --      Diastolic BP Percentile --      Pulse Rate 10/26/23 1036 75     Resp 10/26/23 1036 20  Temp 10/26/23 1036 98.4 F (36.9 C)     Temp Source 10/26/23 1036 Oral     SpO2 10/26/23 1036 97 %     Weight --      Height --      Head Circumference --      Peak Flow --      Pain Score 10/26/23 1038 10     Pain Loc --      Pain Education --      Exclude from Growth Chart --    No data found.  Updated Vital Signs BP 119/78 (BP Location: Right Arm)   Pulse 75   Temp 98.4 F (36.9 C) (Oral)   Resp 20   LMP 10/06/2023 (Exact Date)   SpO2 97%   Visual Acuity Right Eye Distance:   Left Eye Distance:   Bilateral Distance:    Right Eye Near:   Left Eye Near:    Bilateral Near:     Physical Exam Vitals and nursing note reviewed.  Constitutional:      Appearance: Normal appearance. She is not ill-appearing.  HENT:     Head: Atraumatic.     Mouth/Throat:     Mouth: Mucous membranes are moist.  Eyes:     Extraocular Movements: Extraocular movements intact.     Conjunctiva/sclera:  Conjunctivae normal.  Cardiovascular:     Rate and Rhythm: Normal rate.  Pulmonary:     Effort: Pulmonary effort is normal.  Musculoskeletal:        General: Normal range of motion.     Cervical back: Normal range of motion and neck supple.  Skin:    General: Skin is warm.     Comments: 1 cm circular wound bed to the plantar right heel, slight thin drainage, appears to have some granulation tissue forming  Neurological:     Mental Status: She is alert and oriented to person, place, and time.     Comments: Right lower extremity neurovascularly intact  Psychiatric:        Mood and Affect: Mood normal.        Thought Content: Thought content normal.        Judgment: Judgment normal.      UC Treatments / Results  Labs (all labs ordered are listed, but only abnormal results are displayed) Labs Reviewed - No data to display  EKG   Radiology No results found.  Procedures Procedures (including critical care time)  Medications Ordered in UC Medications - No data to display  Initial Impression / Assessment and Plan / UC Course  I have reviewed the triage vital signs and the nursing notes.  Pertinent labs & imaging results that were available during my care of the patient were reviewed by me and considered in my medical decision making (see chart for details).     Unclear etiology of wound, however does appear to be infected.  Will treat with Keflex, Hibiclens, mupirocin ointment and good home wound care and follow-up with PCP and/or podiatry.  Return for any worsening symptoms.  Final Clinical Impressions(s) / UC Diagnoses   Final diagnoses:  Foot lesion  Foot infection   Discharge Instructions   None    ED Prescriptions     Medication Sig Dispense Auth. Provider   cephALEXin (KEFLEX) 500 MG capsule Take 1 capsule (500 mg total) by mouth 2 (two) times daily. 14 capsule Particia Nearing, [place]   chlorhexidine (HIBICLENS) 4 % external liquid Apply topically  daily as needed. 236  mL Particia Nearing, PA-C   mupirocin ointment (BACTROBAN) 2 % Apply 1 Application topically 2 (two) times daily. 22 g Particia Nearing, [place]      PDMP not reviewed this encounter.   Particia Nearing, [place] 10/26/23 1201

## 2023-11-07 DIAGNOSIS — N3000 Acute cystitis without hematuria: Secondary | ICD-10-CM | POA: Diagnosis not present

## 2023-11-07 DIAGNOSIS — B3731 Acute candidiasis of vulva and vagina: Secondary | ICD-10-CM | POA: Diagnosis not present

## 2023-11-10 ENCOUNTER — Encounter: Admitting: Family Medicine

## 2023-11-10 NOTE — Progress Notes (Deleted)
    SUBJECTIVE:   CHIEF COMPLAINT / HPI:   ***  PERTINENT  PMH / PSH: history provoked biltarel PE finished Eliquis in Sept 2024  OBJECTIVE:   LMP 10/06/2023 (Exact Date)   General: A&O, NAD HEENT: No sign of trauma, EOM grossly intact Cardiac: RRR, no m/r/g Respiratory: CTAB, normal WOB, no w/c/r GI: Soft, NTTP, non-distended  Extremities: NTTP, no peripheral edema. Neuro: Normal gait, moves all four extremities appropriately. Psych: Appropriate mood and affect   ASSESSMENT/PLAN:   Assessment & Plan      Billey Co, MD [facility]

## 2023-11-25 ENCOUNTER — Encounter: Payer: Self-pay | Admitting: Student

## 2023-11-25 ENCOUNTER — Ambulatory Visit: Admitting: Student

## 2023-11-25 VITALS — BP 111/69 | HR 88 | Ht 63.0 in | Wt 212.4 lb

## 2023-11-25 DIAGNOSIS — Z0000 Encounter for general adult medical examination without abnormal findings: Secondary | ICD-10-CM

## 2023-11-25 LAB — POCT URINALYSIS DIP (MANUAL ENTRY)
Bilirubin, UA: NEGATIVE
Glucose, UA: NEGATIVE mg/dL
Ketones, POC UA: NEGATIVE mg/dL
Nitrite, UA: POSITIVE — AB
Protein Ur, POC: 100 mg/dL — AB
Spec Grav, UA: 1.015 (ref 1.010–1.025)
Urobilinogen, UA: 1 U/dL
pH, UA: 7 (ref 5.0–8.0)

## 2023-11-25 LAB — POCT UA - MICROSCOPIC ONLY
RBC, Urine, Miroscopic: >20 (ref 0–2)
WBC, Ur, HPF, POC: >20 (ref 0–5)

## 2023-11-25 NOTE — Patient Instructions (Signed)
 It was great to see you today! Thank you for choosing [facility] for your primary care.  Today we addressed: - We will check labs, please schedule an appt for lab work next week  - I will update with results  - Think about birth control options   If you haven't already, sign up for My Chart to have easy access to your labs results, and communication with your primary care physician.   Please arrive 15 minutes before your appointment to ensure smooth check in process.  We appreciate your efforts in making this happen.  Thank you for allowing me to participate in your care, Ernestina Headland, MD 11/25/2023, 3:43 PM PGY-3, [facility]

## 2023-11-25 NOTE — Progress Notes (Signed)
    SUBJECTIVE:   Chief compliant/HPI: annual examination  Angela Johnston is a 23 y.o. who presents today for an annual exam. Previously with PE provoked by OCPs, completed 3 month course of AC  Review of systems form notable for negative.   Patient does report a change in the smell and color of urine and is requesting UA.   Updated history tabs and problem list.   OBJECTIVE:   BP 111/69   Pulse 88   Ht 5\' 3"  (1.6 m)   Wt 212 lb 6.4 oz (96.3 kg)   LMP 10/06/2023 (Exact Date)   SpO2 99%   BMI 37.62 kg/m   General: Alert and oriented in no apparent distress Heart: Regular rate and rhythm with no murmurs appreciated Lungs: CTA bilaterally, no wheezing Abdomen: Bowel sounds present, no abdominal pain Skin: Warm and dry Extremities: No lower extremity edema   ASSESSMENT/PLAN:   Assessment & Plan Annual physical exam Annual Examination  See AVS for age appropriate recommendations.   PHQ score     04/05/2023   10:08 AM 01/07/2023    2:54 PM 02/05/2022    2:14 PM  PHQ9 SCORE ONLY  PHQ-9 Total Score 3 0 10   Reviewed and discussed. Blood pressure reviewed and at goal.  The patient currently uses nothing for contraception. Discussed in depth different types, patient will think about this Considered the following items based upon USPSTF recommendations: HIV testing: NR 8 months ago Hepatitis C: <0.1 Hepatitis B: previously negative, has been vax--checking surface antibody for job Syphilis if at high risk: NR 8 months ago Lipid panel (nonfasting or fasting) discussed based upon AHA recommendations and not ordered.  Consider repeat every 4-6 years.  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     Ernestina Headland, MD [facility]

## 2023-11-26 ENCOUNTER — Other Ambulatory Visit: Payer: Self-pay | Admitting: Student

## 2023-11-26 ENCOUNTER — Encounter: Payer: Self-pay | Admitting: Student

## 2023-11-26 MED ORDER — CEPHALEXIN 500 MG PO CAPS: 500.0000 mg | ORAL_CAPSULE | Freq: Two times a day (BID) | ORAL | 0 refills | Status: AC

## 2023-11-28 ENCOUNTER — Other Ambulatory Visit

## 2023-11-28 DIAGNOSIS — Z0000 Encounter for general adult medical examination without abnormal findings: Secondary | ICD-10-CM

## 2023-12-01 ENCOUNTER — Encounter: Payer: Self-pay | Admitting: Student

## 2023-12-01 ENCOUNTER — Encounter: Payer: Self-pay | Admitting: Family Medicine

## 2023-12-01 ENCOUNTER — Ambulatory Visit: Admitting: Family Medicine

## 2023-12-01 LAB — QUANTIFERON-TB GOLD PLUS
QuantiFERON Nil Value: 0.04 [IU]/mL
QuantiFERON TB1 Ag Value: 0.06 [IU]/mL
QuantiFERON TB2 Ag Value: 0.05 [IU]/mL

## 2023-12-01 LAB — HEPATITIS B SURFACE ANTIBODY, QUANTITATIVE: Hepatitis B Surf Ab Quant: <3.5 m[IU]/mL — ABNORMAL LOW

## 2023-12-04 NOTE — Progress Notes (Deleted)
    SUBJECTIVE:   CHIEF COMPLAINT / HPI: Lab work for school  ***  PERTINENT  PMH / PSH: ***  OBJECTIVE:   There were no vitals taken for this visit.  General: Awake and Alert in NAD HEENT: NCAT. Sclera anicteric. No rhinorrhea. Cardiovascular: RRR. No M/R/G Respiratory: CTAB, normal WOB on RA. No wheezing, crackles, rhonchi, or diminished breath sounds. Abdomen: Soft, non-tender, non-distended. Bowel sounds normoactive/hypoactive/hyperactive. *** Extremities: Able to move all extremities. No BLE edema, no deformities or significant joint findings. Skin: Warm and dry. No abrasions or rashes noted. Neuro: A&Ox***. No focal neurological deficits.  ASSESSMENT/PLAN:   Assessment & Plan      Clyda Dark, DO [facility]

## 2023-12-05 ENCOUNTER — Ambulatory Visit (HOSPITAL_COMMUNITY)
Admission: EM | Admit: 2023-12-05 | Discharge: 2023-12-05 | Disposition: A | Discharge status: Home / Self Care | Attending: Internal Medicine | Admitting: Internal Medicine

## 2023-12-05 ENCOUNTER — Encounter (HOSPITAL_COMMUNITY): Payer: Self-pay | Admitting: Emergency Medicine

## 2023-12-05 ENCOUNTER — Ambulatory Visit: Admitting: Family Medicine

## 2023-12-05 DIAGNOSIS — N898 Other specified noninflammatory disorders of vagina: Secondary | ICD-10-CM | POA: Diagnosis not present

## 2023-12-05 MED ORDER — NYSTATIN 100000 UNIT/GM EX CREA: TOPICAL_CREAM | CUTANEOUS | 0 refills | Status: DC

## 2023-12-05 MED ORDER — FLUCONAZOLE 150 MG PO TABS: 150.0000 mg | ORAL_TABLET | Freq: Every day | ORAL | 0 refills | Status: AC

## 2023-12-05 NOTE — ED Provider Notes (Signed)
 [facility]    CSN: [id number] Arrival date & time: 12/05/23  1524      History   Chief Complaint Chief Complaint  Patient presents with   Vaginal Itching    HPI Angela Johnston is a 23 y.o. female.   23 year old female who presents urgent care with complaints of vaginal discharge and itching. This just started in the last few days. She just finished 7 days of Keflex .  She denies any dysuria, frequency, urgency, abdominal pain.   Vaginal Itching Pertinent negatives include no chest pain, no abdominal pain and no shortness of breath.    Past Medical History:  Diagnosis Date   History of postpartum hypertension 09/30/2020   Maternal varicella, non-immune 06/19/2020   Medical history non-contributory    Pulmonary embolism (HCC)    Sickle cell trait (HCC) 07/16/2020   Noted on Hgb electrophoresis this pregnancy    Patient Active Problem List   Diagnosis Date Noted   Difficulty falling asleep at night until early morning hours 04/05/2023   Bilateral pulmonary embolism (HCC) 02/04/2023   Mood disorder (HCC) 09/01/2021   Menstrual periods irregular 01/28/2021    Past Surgical History:  Procedure Laterality Date   CESAREAN SECTION N/A 09/21/2020   Procedure: CESAREAN SECTION;  Surgeon: Tresia Fruit, MD;  Location: [facility];  Service: Obstetrics;  Laterality: N/A;   NO PAST SURGERIES     WISDOM TOOTH EXTRACTION      OB History     Gravida  1   Para  1   Term  1   Preterm      AB      Living  1      SAB      IAB      Ectopic      Multiple  0   Live Births  1            Home Medications    Prior to Admission medications   Medication Sig Start Date End Date Taking? Authorizing Provider  fluconazole  (DIFLUCAN ) 150 MG tablet Take 1 tablet (150 mg total) by mouth daily for 2 days. take 1 tablet now and then repeat in 3 days 12/05/23 12/07/23 Yes Kreg Pesa, PA-C  nystatin  cream (MYCOSTATIN ) Apply to affected area 2 times daily  for 7 days as needed for itching/yeast infection 12/05/23  Yes Lexie Morini A, PA-C  chlorhexidine  (HIBICLENS ) 4 % external liquid Apply topically daily as needed. 10/26/23   Corbin Dess, PA-C  mupirocin  ointment (BACTROBAN ) 2 % Apply 1 Application topically 2 (two) times daily. 10/26/23   Corbin Dess, PA-C    Family History Family History  Problem Relation Age of Onset   Healthy Mother    Healthy Father    Heart murmur Maternal Grandmother     Social History Social History   Tobacco Use   Smoking status: Never   Smokeless tobacco: Never  Vaping Use   Vaping status: Never Used  Substance Use Topics   Alcohol use: Not Currently   Drug use: Not Currently     Allergies   Junel fe 1.5-30 [norethin ace-eth estrad-fe]   Review of Systems Review of Systems  Constitutional:  Negative for chills and fever.  HENT:  Negative for ear pain and sore throat.   Eyes:  Negative for pain and visual disturbance.  Respiratory:  Negative for cough and shortness of breath.   Cardiovascular:  Negative for chest pain and palpitations.  Gastrointestinal:  Negative for abdominal pain and vomiting.  Genitourinary:  Positive for vaginal discharge. Negative for dysuria and hematuria.       Vaginal itching  Musculoskeletal:  Negative for arthralgias and back pain.  Skin:  Negative for color change and rash.  Neurological:  Negative for seizures and syncope.  All other systems reviewed and are negative.    Physical Exam Triage Vital Signs ED Triage Vitals [12/05/23 1632]  Encounter Vitals Group     BP      Systolic BP Percentile      Diastolic BP Percentile      Pulse      Resp      Temp      Temp src      SpO2      Weight      Height      Head Circumference      Peak Flow      Pain Score 0     Pain Loc      Pain Education      Exclude from Growth Chart    No data found.  Updated Vital Signs LMP 11/27/2023   Visual Acuity Right Eye Distance:   Left  Eye Distance:   Bilateral Distance:    Right Eye Near:   Left Eye Near:    Bilateral Near:     Physical Exam Vitals and nursing note reviewed.  Constitutional:      General: She is not in acute distress.    Appearance: She is well-developed.  HENT:     Head: Normocephalic and atraumatic.  Eyes:     Conjunctiva/sclera: Conjunctivae normal.  Cardiovascular:     Rate and Rhythm: Normal rate and regular rhythm.     Heart sounds: No murmur heard. Pulmonary:     Effort: Pulmonary effort is normal. No respiratory distress.     Breath sounds: Normal breath sounds.  Abdominal:     Palpations: Abdomen is soft.     Tenderness: There is no abdominal tenderness.  Musculoskeletal:        General: No swelling.     Cervical back: Neck supple.  Skin:    General: Skin is warm and dry.     Capillary Refill: Capillary refill takes less than 2 seconds.  Neurological:     Mental Status: She is alert.  Psychiatric:        Mood and Affect: Mood normal.      UC Treatments / Results  Labs (all labs ordered are listed, but only abnormal results are displayed) Labs Reviewed  CERVICOVAGINAL ANCILLARY ONLY    EKG   Radiology No results found.  Procedures Procedures (including critical care time)  Medications Ordered in UC Medications - No data to display  Initial Impression / Assessment and Plan / UC Course  I have reviewed the triage vital signs and the nursing notes.  Pertinent labs & imaging results that were available during my care of the patient were reviewed by me and considered in my medical decision making (see chart for details).     Vaginal discharge  Vagina itching   Given the recent antibiotic use, the symptoms do seem to be most consistent with a vaginal yeast infection.  We have done a vaginal swab today just to ensure there is no other source for the symptoms.  We will treat with the following: Diflucan  150 mg take 1 tablet now and then repeat in 3 days.   Nystatin  cream 2 times daily to the  skin around the labia/vaginal area for 5-7 days.  Advised patient she can try using probiotics to help with preventing yeast infections when taking antibiotics.  Results for the vaginal swab will be available on her MyChart.  We have advised that if the anything is positive we will contact her to arrange treatment. Return to urgent care or PCP if symptoms worsen or fail to resolve.    Final Clinical Impressions(s) / UC Diagnoses   Final diagnoses:  Vaginal discharge  Vagina itching     Discharge Instructions      Given the recent antibiotic use, the symptoms do seem to be most consistent with a vaginal yeast infection.  We have done a vaginal swab today just to ensure there is no other source for the symptoms.  We will treat with the following: Diflucan  150 mg take 1 tablet now and then repeat in 3 days.  Nystatin  cream 2 times daily to the skin around the labia/vaginal area for 5-7 days.  Can try using probiotics to help with preventing yeast infections when taking antibiotics.  Return to urgent care or PCP if symptoms worsen or fail to resolve.     ED Prescriptions     Medication Sig Dispense Auth. Provider   fluconazole  (DIFLUCAN ) 150 MG tablet Take 1 tablet (150 mg total) by mouth daily for 2 days. take 1 tablet now and then repeat in 3 days 2 tablet Alexzia Kasler A, PA-C   nystatin  cream (MYCOSTATIN ) Apply to affected area 2 times daily for 7 days as needed for itching/yeast infection 30 g Kreg Pesa, PA-C      PDMP not reviewed this encounter.   Kreg Pesa, [place] 12/05/23 1737

## 2023-12-05 NOTE — Discharge Instructions (Addendum)
 Given the recent antibiotic use, the symptoms do seem to be most consistent with a vaginal yeast infection.  We have done a vaginal swab today just to ensure there is no other source for the symptoms.  We will treat with the following: Diflucan  150 mg take 1 tablet now and then repeat in 3 days.  Nystatin  cream 2 times daily to the skin around the labia/vaginal area for 5-7 days.  Can try using probiotics to help with preventing yeast infections when taking antibiotics.  Return to urgent care or PCP if symptoms worsen or fail to resolve.

## 2023-12-05 NOTE — ED Triage Notes (Signed)
 Pt reports that she just finished antibiotics for UTI, and now having vaginal itching and redness.

## 2023-12-06 LAB — CERVICOVAGINAL ANCILLARY ONLY
Bacterial Vaginitis (gardnerella): NEGATIVE
Candida Glabrata: NEGATIVE
Candida Vaginitis: POSITIVE — AB
Chlamydia: NEGATIVE
Comment: NEGATIVE
Comment: NEGATIVE
Comment: NEGATIVE
Comment: NEGATIVE
Comment: NEGATIVE
Comment: NORMAL
Neisseria Gonorrhea: NEGATIVE
Trichomonas: NEGATIVE

## 2023-12-07 ENCOUNTER — Ambulatory Visit

## 2023-12-07 DIAGNOSIS — B354 Tinea corporis: Secondary | ICD-10-CM | POA: Diagnosis not present

## 2023-12-16 ENCOUNTER — Telehealth: Payer: Self-pay

## 2023-12-16 NOTE — Telephone Encounter (Signed)
 Patient is requesting a letter stating she did not receive flu shot for her school records. Any further questions please contact patient at [phone].

## 2024-01-10 ENCOUNTER — Ambulatory Visit: Admitting: Student

## 2024-01-10 NOTE — Progress Notes (Deleted)
    SUBJECTIVE:   CHIEF COMPLAINT / HPI:   ***  PERTINENT  PMH / PSH: ***  OBJECTIVE:   There were no vitals taken for this visit.  ***  ASSESSMENT/PLAN:   Assessment & Plan      Alfredo Martinez, MD [facility]

## 2024-01-17 ENCOUNTER — Ambulatory Visit
Admission: RE | Admit: 2024-01-17 | Discharge: 2024-01-17 | Disposition: A | Discharge status: Home / Self Care | Source: Ambulatory Visit | Attending: Nurse Practitioner

## 2024-01-17 VITALS — BP 111/75 | HR 83 | Temp 98.4°F | Resp 20

## 2024-01-17 DIAGNOSIS — Z113 Encounter for screening for infections with a predominantly sexual mode of transmission: Secondary | ICD-10-CM | POA: Diagnosis not present

## 2024-01-17 DIAGNOSIS — N898 Other specified noninflammatory disorders of vagina: Secondary | ICD-10-CM | POA: Diagnosis not present

## 2024-01-17 MED ORDER — CLOTRIMAZOLE 1 % EX CREA: 1.0000 | TOPICAL_CREAM | Freq: Two times a day (BID) | CUTANEOUS | 0 refills | Status: AC

## 2024-01-17 MED ORDER — FLUCONAZOLE 150 MG PO TABS: ORAL_TABLET | ORAL | 0 refills | Status: DC

## 2024-01-17 NOTE — ED Provider Notes (Signed)
 [facility]    CSN: [id number] Arrival date & time: 01/17/24  1007      History   Chief Complaint Chief Complaint  Patient presents with   Vaginal Itching    Entered by patient    HPI Angela Johnston is a 23 y.o. female.   The history is provided by the patient.   Patient presents for complaints of vaginal itching and vaginal irritation has been present for the past week.  Patient states that she has not had any vaginal discharge, vaginal odor, urinary symptoms, or pelvic pain.  Patient was recently treated for a yeast infection last month.  States that symptoms improved after she took the second fluconazole  tablet.  Patient denies any new partners in the past 90 days or prior history of STI/STD. Past Medical History:  Diagnosis Date   History of postpartum hypertension 09/30/2020   Maternal varicella, non-immune 06/19/2020   Medical history non-contributory    Pulmonary embolism (HCC)    Sickle cell trait (HCC) 07/16/2020   Noted on Hgb electrophoresis this pregnancy    Patient Active Problem List   Diagnosis Date Noted   Difficulty falling asleep at night until early morning hours 04/05/2023   Bilateral pulmonary embolism (HCC) 02/04/2023   Mood disorder (HCC) 09/01/2021   Menstrual periods irregular 01/28/2021    Past Surgical History:  Procedure Laterality Date   CESAREAN SECTION N/A 09/21/2020   Procedure: CESAREAN SECTION;  Surgeon: Tresia Fruit, MD;  Location: [facility];  Service: Obstetrics;  Laterality: N/A;   NO PAST SURGERIES     WISDOM TOOTH EXTRACTION      OB History     Gravida  1   Para  1   Term  1   Preterm      AB      Living  1      SAB      IAB      Ectopic      Multiple  0   Live Births  1            Home Medications    Prior to Admission medications   Medication Sig Start Date End Date Taking? Authorizing Provider  clotrimazole (LOTRIMIN) 1 % cream Apply 1 Application topically 2 (two) times daily.  01/17/24  Yes Leath-Warren, Belen Bowers, NP  fluconazole  (DIFLUCAN ) 150 MG tablet Take 1 tablet by mouth today.  May repeat every 3 days up to 2 additional doses. 01/17/24  Yes Leath-Warren, Belen Bowers, NP  chlorhexidine  (HIBICLENS ) 4 % external liquid Apply topically daily as needed. 10/26/23   Corbin Dess, PA-C  mupirocin  ointment (BACTROBAN ) 2 % Apply 1 Application topically 2 (two) times daily. 10/26/23   Corbin Dess, PA-C  nystatin  cream (MYCOSTATIN ) Apply to affected area 2 times daily for 7 days as needed for itching/yeast infection 12/05/23   Kreg Pesa, PA-C    Family History Family History  Problem Relation Age of Onset   Healthy Mother    Healthy Father    Heart murmur Maternal Grandmother     Social History Social History   Tobacco Use   Smoking status: Never   Smokeless tobacco: Never  Vaping Use   Vaping status: Never Used  Substance Use Topics   Alcohol use: Not Currently   Drug use: Not Currently     Allergies   Junel fe 1.5-30 [norethin ace-eth estrad-fe]   Review of Systems Review of Systems Per HPI  Physical  Exam Triage Vital Signs ED Triage Vitals [01/17/24 1013]  Encounter Vitals Group     BP 111/75     Girls Systolic BP Percentile      Girls Diastolic BP Percentile      Boys Systolic BP Percentile      Boys Diastolic BP Percentile      Pulse Rate 83     Resp 20     Temp 98.4 F (36.9 C)     Temp Source Oral     SpO2 98 %     Weight      Height      Head Circumference      Peak Flow      Pain Score 0     Pain Loc      Pain Education      Exclude from Growth Chart    No data found.  Updated Vital Signs BP 111/75 (BP Location: Right Arm)   Pulse 83   Temp 98.4 F (36.9 C) (Oral)   Resp 20   LMP 12/29/2023   SpO2 98%   Visual Acuity Right Eye Distance:   Left Eye Distance:   Bilateral Distance:    Right Eye Near:   Left Eye Near:    Bilateral Near:     Physical Exam Vitals and nursing note  reviewed.  Constitutional:      General: She is not in acute distress.    Appearance: Normal appearance.  HENT:     Head: Normocephalic.   Eyes:     Extraocular Movements: Extraocular movements intact.     Pupils: Pupils are equal, round, and reactive to light.    Cardiovascular:     Rate and Rhythm: Normal rate and regular rhythm.  Pulmonary:     Effort: Pulmonary effort is normal.     Breath sounds: Normal breath sounds.  Abdominal:     Palpations: Abdomen is soft.     Tenderness: There is no abdominal tenderness.  Genitourinary:    Comments: GU exam deferred, self swab performed    Musculoskeletal:     Cervical back: Normal range of motion.   Skin:    General: Skin is warm and dry.   Neurological:     General: No focal deficit present.     Mental Status: She is alert and oriented to person, place, and time.   Psychiatric:        Mood and Affect: Mood normal.        Behavior: Behavior normal.      UC Treatments / Results  Labs (all labs ordered are listed, but only abnormal results are displayed) Labs Reviewed  HIV ANTIBODY (ROUTINE TESTING W REFLEX)  RPR  CERVICOVAGINAL ANCILLARY ONLY    EKG   Radiology No results found.  Procedures Procedures (including critical care time)  Medications Ordered in UC Medications - No data to display  Initial Impression / Assessment and Plan / UC Course  I have reviewed the triage vital signs and the nursing notes.  Pertinent labs & imaging results that were available during my care of the patient were reviewed by me and considered in my medical decision making (see chart for details).  Patient presents for STI testing.  Patient is experiencing vaginal itching and irritation.  Will treat with fluconazole  150 mg and clotrimazole 1% cream to apply topically.. Cytology and HIV/syphilis testing are pending.  Supportive care recommendations were provided and discussed with the patient to include refraining from sexual  intercourse until  test results have been received, notifying all partners if test results are positive, and refrain from sexual intercourse for 7 days after treatment if it is indicated.  Patient also encouraged to increase condom use.  Patient was in agreement with this plan of care and verbalizes understanding.  All questions were answered.  Patient stable for discharge  Final Clinical Impressions(s) / UC Diagnoses   Final diagnoses:  Vaginal itching  Screening for STD (sexually transmitted disease)     Discharge Instructions      Cytology results and HIV/syphilis test results are pending.  You will be contacted if the pending test results are positive.  You will also have access to your results via MyChart. If your test results are positive, please notify all partners. Take medication as prescribed. Your treatment is indicated, you will need to refrain from sexual intercourse for an additional 7 days after completing treatment. Increase condom use to seek sexual encounter. Follow-up as needed.      ED Prescriptions     Medication Sig Dispense Auth. Provider   fluconazole  (DIFLUCAN ) 150 MG tablet Take 1 tablet by mouth today.  May repeat every 3 days up to 2 additional doses. 3 tablet Leath-Warren, Belen Bowers, NP   clotrimazole (LOTRIMIN) 1 % cream Apply 1 Application topically 2 (two) times daily. 30 g Leath-Warren, Belen Bowers, NP      PDMP not reviewed this encounter.   Hardy Lia, NP 01/17/24 1105

## 2024-01-17 NOTE — ED Triage Notes (Signed)
 Pt reports she has some vaginal pumps and labia minora itching x 1 week.  APPLIED HYDROCORTISONE CREAM TO AREA but no relief  Denies any recent sexual intercourse

## 2024-01-17 NOTE — Discharge Instructions (Addendum)
 Cytology results and HIV/syphilis test results are pending.  You will be contacted if the pending test results are positive.  You will also have access to your results via MyChart. If your test results are positive, please notify all partners. Take medication as prescribed. Your treatment is indicated, you will need to refrain from sexual intercourse for an additional 7 days after completing treatment. Increase condom use to seek sexual encounter. Follow-up as needed.

## 2024-01-18 ENCOUNTER — Ambulatory Visit (HOSPITAL_COMMUNITY): Payer: Self-pay

## 2024-01-18 LAB — CERVICOVAGINAL ANCILLARY ONLY
Bacterial Vaginitis (gardnerella): NEGATIVE
Candida Glabrata: NEGATIVE
Candida Vaginitis: POSITIVE — AB
Chlamydia: NEGATIVE
Comment: NEGATIVE
Comment: NEGATIVE
Comment: NEGATIVE
Comment: NEGATIVE
Comment: NEGATIVE
Comment: NORMAL
Neisseria Gonorrhea: NEGATIVE
Trichomonas: NEGATIVE

## 2024-01-18 LAB — RPR: RPR Ser Ql: NONREACTIVE

## 2024-01-18 LAB — HIV ANTIBODY (ROUTINE TESTING W REFLEX): HIV Screen 4th Generation wRfx: NONREACTIVE

## 2024-01-26 ENCOUNTER — Ambulatory Visit
Admission: EM | Admit: 2024-01-26 | Discharge: 2024-01-26 | Disposition: A | Discharge status: Home / Self Care | Attending: Nurse Practitioner | Admitting: Nurse Practitioner

## 2024-01-26 DIAGNOSIS — J069 Acute upper respiratory infection, unspecified: Secondary | ICD-10-CM | POA: Diagnosis not present

## 2024-01-26 LAB — POC COVID19/FLU A&B COMBO
Covid Antigen, POC: NEGATIVE
Influenza A Antigen, POC: NEGATIVE
Influenza B Antigen, POC: NEGATIVE

## 2024-01-26 MED ORDER — PROMETHAZINE-DM 6.25-15 MG/5ML PO SYRP: 5.0000 mL | ORAL_SOLUTION | Freq: Every evening | ORAL | 0 refills | Status: DC | PRN

## 2024-01-26 MED ORDER — BENZONATATE 100 MG PO CAPS: 100.0000 mg | ORAL_CAPSULE | Freq: Three times a day (TID) | ORAL | 0 refills | Status: DC | PRN

## 2024-01-26 NOTE — ED Provider Notes (Signed)
 [facility]    CSN: [id number] Arrival date & time: 01/26/24  1011      History   Chief Complaint Chief Complaint  Patient presents with   Cough    HPI EVALYSE Johnston is a 23 y.o. female.   Patient presents today with 2-day history of dry and congested cough, chest pain with coughing, stuffy nose, lack of taste, and fatigue.  No fever, body aches or chills, shortness of breath, runny nose, sore throat, headache, ear pain, abdominal pain, nausea/vomiting, or diarrhea.  59-year-old daughter is sick with similar symptoms.  She has been taking over-the-counter cough medicine without relief.    Past Medical History:  Diagnosis Date   History of postpartum hypertension 09/30/2020   Maternal varicella, non-immune 06/19/2020   Medical history non-contributory    Pulmonary embolism (HCC)    Sickle cell trait (HCC) 07/16/2020   Noted on Hgb electrophoresis this pregnancy    Patient Active Problem List   Diagnosis Date Noted   Difficulty falling asleep at night until early morning hours 04/05/2023   Bilateral pulmonary embolism (HCC) 02/04/2023   Mood disorder (HCC) 09/01/2021   Menstrual periods irregular 01/28/2021    Past Surgical History:  Procedure Laterality Date   CESAREAN SECTION N/A 09/21/2020   Procedure: CESAREAN SECTION;  Surgeon: Angela Lynwood MATSU, MD;  Location: [facility] LD [facility]S;  Service: Obstetrics;  Laterality: N/A;   NO PAST SURGERIES     WISDOM TOOTH EXTRACTION      OB History     Gravida  1   Para  1   Term  1   Preterm      AB      Living  1      SAB      IAB      Ectopic      Multiple  0   Live Births  1            Home Medications    Prior to Admission medications   Medication Sig Start Date End Date Taking? Authorizing Provider  benzonatate (TESSALON) 100 MG capsule Take 1 capsule (100 mg total) by mouth 3 (three) times daily as needed for cough. Do not take with alcohol or while operating or driving heavy machinery  3/73/74  Yes Chandra Harlene LABOR, NP  promethazine-dextromethorphan (PROMETHAZINE-DM) 6.25-15 MG/5ML syrup Take 5 mLs by mouth at bedtime as needed for cough. Do not take with alcohol or while driving or operating heavy machinery.  May cause drowsiness. 01/26/24  Yes Chandra Harlene A, NP  chlorhexidine  (HIBICLENS ) 4 % external liquid Apply topically daily as needed. 10/26/23   Stuart Vernell Norris, PA-C  clotrimazole (LOTRIMIN) 1 % cream Apply 1 Application topically 2 (two) times daily. 01/17/24   Leath-Warren, Etta PARAS, NP  fluconazole  (DIFLUCAN ) 150 MG tablet Take 1 tablet by mouth today.  May repeat every 3 days up to 2 additional doses. 01/17/24   Leath-Warren, Etta PARAS, NP  mupirocin  ointment (BACTROBAN ) 2 % Apply 1 Application topically 2 (two) times daily. 10/26/23   Stuart Vernell Norris, PA-C  nystatin  cream (MYCOSTATIN ) Apply to affected area 2 times daily for 7 days as needed for itching/yeast infection 12/05/23   Teresa Norris LABOR, PA-C    Family History Family History  Problem Relation Age of Onset   Healthy Mother    Healthy Father    Heart murmur Maternal Grandmother     Social History Social History   Tobacco Use   Smoking status:  Never   Smokeless tobacco: Never  Vaping Use   Vaping status: Never Used  Substance Use Topics   Alcohol use: Not Currently   Drug use: Not Currently     Allergies   Junel fe 1.5-30 [norethin ace-eth estrad-fe]   Review of Systems Review of Systems Per HPI  Physical Exam Triage Vital Signs ED Triage Vitals [01/26/24 1050]  Encounter Vitals Group     BP 116/79     Girls Systolic BP Percentile      Girls Diastolic BP Percentile      Boys Systolic BP Percentile      Boys Diastolic BP Percentile      Pulse Rate 89     Resp 16     Temp 98.2 F (36.8 C)     Temp Source Oral     SpO2 97 %     Weight      Height      Head Circumference      Peak Flow      Pain Score 0     Pain Loc      Pain Education      Exclude  from Growth Chart    No data found.  Updated Vital Signs BP 116/79 (BP Location: Right Arm)   Pulse 89   Temp 98.2 F (36.8 C) (Oral)   Resp 16   LMP 01/23/2024 Comment: start  SpO2 97%   Visual Acuity Right Eye Distance:   Left Eye Distance:   Bilateral Distance:    Right Eye Near:   Left Eye Near:    Bilateral Near:     Physical Exam Vitals and nursing note reviewed.  Constitutional:      General: She is not in acute distress.    Appearance: Normal appearance. She is not ill-appearing or toxic-appearing.  HENT:     Head: Normocephalic and atraumatic.     Right Ear: Tympanic membrane, ear canal and external ear normal.     Left Ear: Tympanic membrane, ear canal and external ear normal.     Nose: Congestion and rhinorrhea present.     Mouth/Throat:     Mouth: Mucous membranes are moist.     Pharynx: Oropharynx is clear. No oropharyngeal exudate or posterior oropharyngeal erythema.   Eyes:     General: No scleral icterus.    Extraocular Movements: Extraocular movements intact.    Cardiovascular:     Rate and Rhythm: Normal rate and regular rhythm.  Pulmonary:     Effort: Pulmonary effort is normal. No respiratory distress.     Breath sounds: Normal breath sounds. No wheezing, rhonchi or rales.   Musculoskeletal:     Cervical back: Normal range of motion and neck supple.  Lymphadenopathy:     Cervical: No cervical adenopathy.   Skin:    General: Skin is warm and dry.     Coloration: Skin is not jaundiced or pale.     Findings: No erythema or rash.   Neurological:     Mental Status: She is alert and oriented to person, place, and time.   Psychiatric:        Behavior: Behavior is cooperative.      UC Treatments / Results  Labs (all labs ordered are listed, but only abnormal results are displayed) Labs Reviewed  POC COVID19/FLU A&B COMBO - Normal    EKG   Radiology No results found.  Procedures Procedures (including critical care  time)  Medications Ordered in UC Medications - No  data to display  Initial Impression / Assessment and Plan / UC Course  I have reviewed the triage vital signs and the nursing notes.  Pertinent labs & imaging results that were available during my care of the patient were reviewed by me and considered in my medical decision making (see chart for details).   Patient is well-appearing, normotensive, afebrile, not tachycardic, not tachypneic, oxygenating well on room air.   1. Viral URI with cough Suspect viral etiology Vitals and exam are reassuring today COVID-19, influenza testing is negative Supportive care discussed with patient, start cough suppressant medication ER and return precautions discussed  The patient was given the opportunity to ask questions.  All questions answered to their satisfaction.  The patient is in agreement to this plan.   Final Clinical Impressions(s) / UC Diagnoses   Final diagnoses:  Viral URI with cough     Discharge Instructions      You have a viral upper respiratory infection.  Symptoms should improve over the next week to 10 days.  If you develop chest pain or shortness of breath, go to the emergency room.  COVID-19 and influenza testing is negative today.  Some things that can make you feel better are: - Increased rest - Increasing fluid with water /sugar free electrolytes - Acetaminophen  and ibuprofen  as needed for fever/pain - Salt water  gargling, chloraseptic spray and throat lozenges - OTC guaifenesin (Mucinex) 600 mg twice daily - Saline sinus flushes or a neti pot - Humidifying the air -Tessalon Perles every 8 hours as needed for dry cough and cough syrup at night time as needed     ED Prescriptions     Medication Sig Dispense Auth. Provider   promethazine-dextromethorphan (PROMETHAZINE-DM) 6.25-15 MG/5ML syrup Take 5 mLs by mouth at bedtime as needed for cough. Do not take with alcohol or while driving or operating heavy  machinery.  May cause drowsiness. 118 mL Chandra Raisin A, NP   benzonatate (TESSALON) 100 MG capsule Take 1 capsule (100 mg total) by mouth 3 (three) times daily as needed for cough. Do not take with alcohol or while operating or driving heavy machinery 21 capsule Chandra Raisin LABOR, NP      PDMP not reviewed this encounter.   Chandra Raisin LABOR, NP 01/26/24 1122

## 2024-01-26 NOTE — Discharge Instructions (Signed)
 You have a viral upper respiratory infection.  Symptoms should improve over the next week to 10 days.  If you develop chest pain or shortness of breath, go to the emergency room.  COVID-19 and influenza testing is negative today.  Some things that can make you feel better are: - Increased rest - Increasing fluid with water /sugar free electrolytes - Acetaminophen  and ibuprofen  as needed for fever/pain - Salt water  gargling, chloraseptic spray and throat lozenges - OTC guaifenesin (Mucinex) 600 mg twice daily - Saline sinus flushes or a neti pot - Humidifying the air -Tessalon Perles every 8 hours as needed for dry cough and cough syrup at night time as needed

## 2024-01-26 NOTE — ED Triage Notes (Signed)
 Pt reports she has a cough, sneezing, throat pain and congestion x 2 days

## 2024-01-31 ENCOUNTER — Encounter: Admitting: Podiatry

## 2024-01-31 DIAGNOSIS — Z4801 Encounter for change or removal of surgical wound dressing: Secondary | ICD-10-CM | POA: Diagnosis not present

## 2024-01-31 DIAGNOSIS — L988 Other specified disorders of the skin and subcutaneous tissue: Secondary | ICD-10-CM | POA: Diagnosis not present

## 2024-02-05 NOTE — Progress Notes (Signed)
 Patient did not show for scheduled appointment on 01/31/2024

## 2024-02-14 ENCOUNTER — Ambulatory Visit: Admitting: Family Medicine

## 2024-02-14 NOTE — Patient Instructions (Signed)
 It was wonderful to see you today.  Please bring ALL of your medications with you to every visit.   Today we talked about:  Screening for diabetes -You have had an elevated glucose level in the past. I have gotten a test that screens for diabetes.   Skin lesion -   Please follow up in ** months   Thank you for choosing [facility].   Please call [phone] with any questions about today's appointment.  Please be sure to schedule follow up at the front desk before you leave today.   Areta Saliva, MD  Family Medicine

## 2024-02-14 NOTE — Progress Notes (Unsigned)
    SUBJECTIVE:   CHIEF COMPLAINT / HPI:   Screening for diabetes  Elevated glucose (116) on CMP about a year ago.  Family history   Skin lesion   PERTINENT  PMH / PSH: ***  OBJECTIVE:   LMP 01/23/2024 Comment: start  ***  ASSESSMENT/PLAN:   Assessment & Plan Elevated glucose level      Areta Saliva, MD [facility]

## 2024-02-15 NOTE — Progress Notes (Signed)
 Encounter opened accidentally.  Visit was cancelled

## 2024-02-17 ENCOUNTER — Ambulatory Visit (INDEPENDENT_AMBULATORY_CARE_PROVIDER_SITE_OTHER)

## 2024-02-17 ENCOUNTER — Ambulatory Visit: Admitting: Podiatry

## 2024-02-17 ENCOUNTER — Encounter: Payer: Self-pay | Admitting: Podiatry

## 2024-02-17 DIAGNOSIS — M722 Plantar fascial fibromatosis: Secondary | ICD-10-CM | POA: Diagnosis not present

## 2024-02-17 DIAGNOSIS — M7731 Calcaneal spur, right foot: Secondary | ICD-10-CM | POA: Diagnosis not present

## 2024-02-17 DIAGNOSIS — D492 Neoplasm of unspecified behavior of bone, soft tissue, and skin: Secondary | ICD-10-CM

## 2024-02-17 NOTE — Progress Notes (Signed)
 Presents today with complaint of pain on the heel with a lesion that bleeds easily.  Has had it for about a year.  Is been getting worse.  Does recall any injury to the area.  Has not noticed any signs of infection.   Physical exam:  General appearance: Pleasant, and in no acute distress. AOx3.  Vascular: Pedal pulses: DP 2/4 bilaterally, PT 2/4 bilaterally.  Minimal edema lower legs bilaterally. Capillary fill time immediate bilaterally.  Neurological: Light touch intact feet bilaterally.  Normal Achilles reflex bilaterally.  No clonus or spasticity noted.  Negative Tinel's sign tarsal tunnel and porta pedis bilaterally  Dermatologic:   1 cm diameter pedunculated mass of granulomatous tissue in the plantar medial aspect of the heel right skin normal temperature bilaterally.  Skin normal color, tone, and texture bilaterally.   Musculoskeletal: Tenderness plantar medial aspect heel right.  No tenderness lateral compression of calcaneus.  No bony masses noted.  No subcutaneous masses noted.  Radiographs: 3 views foot right: Mild osteophytic changes plantar calcaneal tubercle.  Hallux abductovalgus moderate.  No assenting fractures or dislocations.  No evidence any bone tumors.  Normal bone density.  Diagnosis: 1.  Benign neoplasm plantar heel right 2.  Calcaneal spur right  Plan: -New patient office visit level 3 for evaluation and management. -Discussed etiology and treatment of the benign neoplasm.  Probably a pyogenic granuloma.  No infection noted.  Will do an excision/biopsy of the lesion.   Return 1 to 2 weeks for biopsy lesion heel right

## 2024-02-20 DIAGNOSIS — K0889 Other specified disorders of teeth and supporting structures: Secondary | ICD-10-CM | POA: Diagnosis not present

## 2024-02-21 ENCOUNTER — Ambulatory Visit: Admitting: Family Medicine

## 2024-02-24 ENCOUNTER — Encounter: Payer: Self-pay | Admitting: Podiatry

## 2024-02-24 ENCOUNTER — Ambulatory Visit (INDEPENDENT_AMBULATORY_CARE_PROVIDER_SITE_OTHER): Admitting: Podiatry

## 2024-02-24 DIAGNOSIS — D492 Neoplasm of unspecified behavior of bone, soft tissue, and skin: Secondary | ICD-10-CM

## 2024-02-24 NOTE — Addendum Note (Signed)
 Addended by: GIB MABLE SAUNDERS on: 02/24/2024 02:10 PM   Modules accepted: Orders

## 2024-02-24 NOTE — Progress Notes (Signed)
 Patient presents for biopsy of the lesion the medial aspect plantar medial aspect of the heel on the right   Physical exam:  General appearance: Pleasant, and in no acute distress. AOx3.  Vascular: Pedal pulses: DP 2/4 bilaterally, PT 2/4 bilaterally. Mild edema lower legs bilaterally. Capillary fill time immediate.  Neurological: Light touch intact feet bilaterally.  Normal Achilles reflex bilaterally.  No clonus or spasticity noted.   Dermatologic:   10 mm pedunculated granulomatous lesion right skin normal temperature bilaterally.  Skin normal color, tone, and texture bilaterally.   Musculoskeletal:     Diagnosis: Benign neoplasm medial right  Plan: -Consent reviewed and signed for biopsy of skin lesion.  - Surgical site(s) anesthetized with 3 cc of a one-to-one mixture of 0.5% Marcaine  plain and 2% lidocaine  plain.  Surgical site prepped.  Shave biopsies performed of  lesion as noted in physical exam utilizing a #15 blade shaving lesion down to level of dermis.  Lesion excision sites treated with fulguration.  Lesions were sent for pathological evaluation.  Triple antibiotic ointment was applied to the surgery sites and a gauze and Coban dressing applied.  Written and oral postoperative instructions were given.  -Return in 2 weeks for POV and to discuss pathology results.

## 2024-02-24 NOTE — Patient Instructions (Signed)

## 2024-03-08 ENCOUNTER — Ambulatory Visit: Payer: Self-pay | Admitting: Podiatry

## 2024-03-08 NOTE — Telephone Encounter (Signed)
 Left detailed message and asked to call me back if needed.

## 2024-03-09 ENCOUNTER — Encounter: Payer: Self-pay | Admitting: Family Medicine

## 2024-03-09 ENCOUNTER — Ambulatory Visit: Admitting: Podiatry

## 2024-03-09 ENCOUNTER — Encounter: Payer: Self-pay | Admitting: Podiatry

## 2024-03-09 ENCOUNTER — Ambulatory Visit (INDEPENDENT_AMBULATORY_CARE_PROVIDER_SITE_OTHER): Admitting: Family Medicine

## 2024-03-09 VITALS — BP 123/70 | HR 72 | Ht 63.0 in | Wt 197.2 lb

## 2024-03-09 DIAGNOSIS — R7309 Other abnormal glucose: Secondary | ICD-10-CM | POA: Diagnosis present

## 2024-03-09 DIAGNOSIS — G4700 Insomnia, unspecified: Secondary | ICD-10-CM | POA: Diagnosis not present

## 2024-03-09 DIAGNOSIS — D2371 Other benign neoplasm of skin of right lower limb, including hip: Secondary | ICD-10-CM

## 2024-03-09 LAB — POCT GLYCOSYLATED HEMOGLOBIN (HGB A1C): Hemoglobin A1C: 5.4 % (ref 4.0–5.6)

## 2024-03-09 NOTE — Assessment & Plan Note (Signed)
 Patient most likely has difficulty falling asleep due to lack of sleep hygiene. She also has signs of moderate anxiety per GAD7. Would benefit her mood and sleep to improve sleep hygiene.  - Counseled on ways to improve sleep hygiene  - Offered therapy resources to patient to help with anxiety symptoms  - Recommended weaning off of benadryl .  - Can consider SSRI if mood does not improve with conservative measures.  - Follow up in one month.

## 2024-03-09 NOTE — Progress Notes (Signed)
 Patient presents follow-up biopsy lesion right foot.  Doing well no complaints of some numbness at the biopsy site.   Physical exam:  General appearance: Pleasant, and in no acute distress. AOx3.  Vascular: Pedal pulses: DP 2/4 bilaterally, PT 2/4 bilaterally.    Neurological: Grossly intact bilaterally  Dermatologic:   Biopsy site healed well with no signs of faction.  Skin normal temperature bilaterally.  Skin normal color, tone, and texture bilaterally.   Musculoskeletal:   Pathology: Poroma benign  Diagnosis: 1.  Benign poroma foot right  Plan: -Establish office visit for evaluation and management level 2. - Discussed the numbness at the biopsy site.  This should go to go away over the next 2 to 3 months.  Does not have to do any wound care at the site anymore. -Discussed that benign poroma's which are neoplasms deriving from sweat ducts in the skin.  Explained that recurrence with these was very low   Return as needed

## 2024-03-09 NOTE — Progress Notes (Signed)
    SUBJECTIVE:   CHIEF COMPLAINT / HPI:   Concern for diabetes  Patient was told she should be checked for diabetes given history of elevated glucose levels on labs in the past. She wanted to have an A1c checked.   Insomnia  Takes benadryl  daily to go to sleep.  Patient lays there and is wide awake.  Patient has one coffee mug of coffee per day in the morning. Drinks soda every other day 2 sodas.  No teas.  Uses screens before bed like her phone. Puts her phone away right before she falls asleep.  Denies racing thoughts before bed.  Does not have set routine. Says she sometimes sleeps at 11:30 pm and wakes up around 7 am.    GAD 7 score 11  Patient reports she used to be on an SSRI but did not like that it made her feel different. Would be open to trying a different medication in the future if necessary.    PERTINENT  PMH / PSH: H/o PE, Mood disorder   OBJECTIVE:   BP 123/70   Pulse 72   Ht 5' 3 (1.6 m)   Wt 197 lb 3.2 oz (89.4 kg)   SpO2 100%   BMI 34.93 kg/m   General: well appearing, in no acute distress CV: appears well perfused  Resp: Normal work of breathing on room air Neuro: Alert & Oriented x 4    ASSESSMENT/PLAN:   Assessment & Plan Elevated glucose Patient's A1c today was normal. No diabetes.  - Discussed diet and exercise recommendations to help prevent diabetes in the future.  Insomnia, unspecified type Patient most likely has difficulty falling asleep due to lack of sleep hygiene. She also has signs of moderate anxiety per GAD7. Would benefit her mood and sleep to improve sleep hygiene.  - Counseled on ways to improve sleep hygiene  - Offered therapy resources to patient to help with anxiety symptoms  - Recommended weaning off of benadryl .  - Can consider SSRI if mood does not improve with conservative measures.  - Follow up in one month.      Areta Saliva, MD [facility]

## 2024-03-09 NOTE — Patient Instructions (Signed)
 It was wonderful to see you today.  Please bring ALL of your medications with you to every visit.   Today we talked about:  Diabetes screening - your screening was negative aka normal. Staying active and eating lots of vegetables, protein, fruit is a way to prevent diabetes in the future.   For your trouble falling asleep - Try to avoid screens for 2 hours before bed. Keep a regular routine and try to wean off the benadryl . Try this for one month and we can follow up.   Please follow up in 1 month   Thank you for choosing [facility].   Please call [phone] with any questions about today's appointment.  Please be sure to schedule follow up at the front desk before you leave today.   Areta Saliva, MD  Family Medicine

## 2024-03-20 ENCOUNTER — Ambulatory Visit

## 2024-04-10 ENCOUNTER — Encounter: Payer: Self-pay | Admitting: Emergency Medicine

## 2024-04-10 ENCOUNTER — Ambulatory Visit: Admission: EM | Admit: 2024-04-10 | Discharge: 2024-04-10 | Disposition: A | Discharge status: Home / Self Care

## 2024-04-10 DIAGNOSIS — N760 Acute vaginitis: Secondary | ICD-10-CM | POA: Diagnosis not present

## 2024-04-10 LAB — POCT URINE PREGNANCY: Preg Test, Ur: NEGATIVE

## 2024-04-10 MED ORDER — FLUCONAZOLE 150 MG PO TABS: 150.0000 mg | ORAL_TABLET | Freq: Every day | ORAL | 0 refills | Status: DC

## 2024-04-10 NOTE — ED Provider Notes (Signed)
 [facility]   Note:  This document was prepared using Dragon voice recognition software and may include unintentional dictation errors.  MRN: [id number] DOB: October 17, 2000  Subjective:   Angela Johnston is a 23 y.o. female presenting for vaginal itching and odor since Friday.  Patient reports past history of vaginal yeast infection.  Patient states that symptoms are very similar to previous manifestations of vaginal Candida.  No dysuria, increased urinary frequency, abdominal pain, flank pain.  Patient denies any significant vaginal discharge more than normal.  Denies any concern for STD.  No current facility-administered medications for this encounter.  Current Outpatient Medications:    fluconazole  (DIFLUCAN ) 150 MG tablet, Take 1 tablet (150 mg total) by mouth daily. If symptoms persist take second dose of Diflucan  150 mg on day 3 to treat vaginal Candida., Disp: 2 tablet, Rfl: 0   benzonatate  (TESSALON ) 100 MG capsule, Take 1 capsule (100 mg total) by mouth 3 (three) times daily as needed for cough. Do not take with alcohol or while operating or driving heavy machinery (Patient not taking: Reported on 03/09/2024), Disp: 21 capsule, Rfl: 0   clotrimazole  (LOTRIMIN ) 1 % cream, Apply 1 Application topically 2 (two) times daily., Disp: 30 g, Rfl: 0   promethazine -dextromethorphan (PROMETHAZINE -DM) 6.25-15 MG/5ML syrup, Take 5 mLs by mouth at bedtime as needed for cough. Do not take with alcohol or while driving or operating heavy machinery.  May cause drowsiness. (Patient not taking: Reported on 03/09/2024), Disp: 118 mL, Rfl: 0   Allergies  Allergen Reactions   Junel Fe 1.5-30 [Norethin Ace-Eth Estrad-Fe] Other (See Comments)    Had bilateral PE while taking OCP    Past Medical History:  Diagnosis Date   History of postpartum hypertension 09/30/2020   Maternal varicella, non-immune 06/19/2020   Medical history non-contributory    Pulmonary embolism (HCC)    Sickle cell trait (HCC)  07/16/2020   Noted on Hgb electrophoresis this pregnancy     Past Surgical History:  Procedure Laterality Date   CESAREAN SECTION N/A 09/21/2020   Procedure: CESAREAN SECTION;  Surgeon: Eveline Lynwood MATSU, MD;  Location: [facility] LD [facility]S;  Service: Obstetrics;  Laterality: N/A;   NO PAST SURGERIES     WISDOM TOOTH EXTRACTION      Family History  Problem Relation Age of Onset   Healthy Mother    Healthy Father    Heart murmur Maternal Grandmother     Social History   Tobacco Use   Smoking status: Never   Smokeless tobacco: Never  Vaping Use   Vaping status: Never Used  Substance Use Topics   Alcohol use: Not Currently   Drug use: Not Currently    ROS Refer to HPI for ROS details.  Objective:   Vitals: BP 113/71 (BP Location: Right Arm)   Pulse 87   Temp 98.4 F (36.9 C) (Oral)   Resp 18   LMP 03/30/2024 (Exact Date)   SpO2 97%   Physical Exam Vitals and nursing note reviewed.  Constitutional:      General: She is not in acute distress.    Appearance: Normal appearance. She is well-developed. She is not ill-appearing or toxic-appearing.  HENT:     Head: Normocephalic and atraumatic.  Cardiovascular:     Rate and Rhythm: Normal rate.  Pulmonary:     Effort: Pulmonary effort is normal. No respiratory distress.  Abdominal:     Palpations: Abdomen is soft.     Tenderness: There is no abdominal tenderness. There  is no right CVA tenderness or left CVA tenderness.  Genitourinary:    Vagina: No vaginal discharge.  Musculoskeletal:        General: Normal range of motion.  Skin:    General: Skin is warm and dry.  Neurological:     General: No focal deficit present.     Mental Status: She is alert and oriented to person, place, and time.  Psychiatric:        Mood and Affect: Mood normal.        Behavior: Behavior normal.     Procedures  Results for orders placed or performed during the hospital encounter of 04/10/24 (from the past 24 hours)  POCT urine pregnancy      Status: None   Collection Time: 04/10/24 10:35 AM  Result Value Ref Range   Preg Test, Ur Negative Negative    No results found.   Assessment and Plan :     Discharge Instructions       1. Acute vaginitis (Primary) - POCT urine pregnancy completed UC is negative for pregnancy - Cervicovaginal collected in UC and sent to lab for further testing results should be available in 2 to 3 days. - fluconazole  (DIFLUCAN ) 150 MG tablet; Take 1 tablet (150 mg total) by mouth daily. If symptoms persist take second dose of Diflucan  150 mg on day 3 to treat vaginal Candida.  Dispense: 2 tablet; Refill: 0 -Continue to monitor symptoms for any change in severity if there is any escalation of current symptoms or development of new symptoms follow-up in ER for further evaluation and management.      Cottrell Gentles B Trenyce Loera   Tiyona Desouza, Boyd B, [place] 04/10/24 1047

## 2024-04-10 NOTE — ED Triage Notes (Signed)
 Vaginal itching and odor since Friday.

## 2024-04-10 NOTE — Discharge Instructions (Signed)
  1. Acute vaginitis (Primary) - POCT urine pregnancy completed UC is negative for pregnancy - Cervicovaginal collected in UC and sent to lab for further testing results should be available in 2 to 3 days. - fluconazole  (DIFLUCAN ) 150 MG tablet; Take 1 tablet (150 mg total) by mouth daily. If symptoms persist take second dose of Diflucan  150 mg on day 3 to treat vaginal Candida.  Dispense: 2 tablet; Refill: 0 -Continue to monitor symptoms for any change in severity if there is any escalation of current symptoms or development of new symptoms follow-up in ER for further evaluation and management.

## 2024-04-11 LAB — CERVICOVAGINAL ANCILLARY ONLY
Bacterial Vaginitis (gardnerella): POSITIVE — AB
Candida Glabrata: NEGATIVE
Candida Vaginitis: POSITIVE — AB
Chlamydia: NEGATIVE
Comment: NEGATIVE
Comment: NEGATIVE
Comment: NEGATIVE
Comment: NEGATIVE
Comment: NEGATIVE
Comment: NORMAL
Neisseria Gonorrhea: NEGATIVE
Trichomonas: NEGATIVE

## 2024-04-12 ENCOUNTER — Ambulatory Visit (HOSPITAL_COMMUNITY): Payer: Self-pay

## 2024-04-12 MED ORDER — METRONIDAZOLE 500 MG PO TABS: 500.0000 mg | ORAL_TABLET | Freq: Two times a day (BID) | ORAL | 0 refills | Status: AC

## 2024-05-07 ENCOUNTER — Encounter: Admitting: Obstetrics & Gynecology

## 2024-05-12 DIAGNOSIS — K047 Periapical abscess without sinus: Secondary | ICD-10-CM | POA: Diagnosis not present

## 2024-07-11 DIAGNOSIS — N926 Irregular menstruation, unspecified: Secondary | ICD-10-CM | POA: Diagnosis not present

## 2024-07-12 ENCOUNTER — Ambulatory Visit

## 2024-07-12 ENCOUNTER — Encounter: Payer: Self-pay | Admitting: Emergency Medicine

## 2024-07-12 ENCOUNTER — Ambulatory Visit
Admission: EM | Admit: 2024-07-12 | Discharge: 2024-07-12 | Disposition: A | Discharge status: Home / Self Care | Attending: Nurse Practitioner | Admitting: Nurse Practitioner

## 2024-07-12 DIAGNOSIS — N926 Irregular menstruation, unspecified: Secondary | ICD-10-CM | POA: Diagnosis not present

## 2024-07-12 LAB — POCT URINE PREGNANCY: Preg Test, Ur: NEGATIVE

## 2024-07-12 NOTE — ED Provider Notes (Signed)
 [facility]    CSN: [id number] Arrival date & time: 07/12/24  1647      History   Chief Complaint No chief complaint on file.   HPI Angela LOURENCO is a 23 y.o. female.   The history is provided by the patient.   Patient presents for pregnancy test.  States that she is 2 days late for her period.  LMP 06/13/2024.  Patient denies symptoms to include fever, chills, nausea, vomiting, increased fatigue, breast tenderness, or spotting.  Patient states I did not have any symptoms with my first pregnancy.  Reports that she had unprotected sex towards the end of November. Past Medical History:  Diagnosis Date   History of postpartum hypertension 09/30/2020   Maternal varicella, non-immune 06/19/2020   Medical history non-contributory    Pulmonary embolism (HCC)    Sickle cell trait 07/16/2020   Noted on Hgb electrophoresis this pregnancy    Patient Active Problem List   Diagnosis Date Noted   Insomnia 04/05/2023   Bilateral pulmonary embolism (HCC) 02/04/2023   Mood disorder 09/01/2021   Menstrual periods irregular 01/28/2021    Past Surgical History:  Procedure Laterality Date   CESAREAN SECTION N/A 09/21/2020   Procedure: CESAREAN SECTION;  Surgeon: Eveline Lynwood MATSU, MD;  Location: [facility]S;  Service: Obstetrics;  Laterality: N/A;   NO PAST SURGERIES     WISDOM TOOTH EXTRACTION      OB History     Gravida  1   Para  1   Term  1   Preterm      AB      Living  1      SAB      IAB      Ectopic      Multiple  0   Live Births  1            Home Medications    Prior to Admission medications  Medication Sig Start Date End Date Taking? Authorizing Provider  clotrimazole  (LOTRIMIN ) 1 % cream Apply 1 Application topically 2 (two) times daily. 01/17/24   Leath-Warren, Etta PARAS, NP    Family History Family History  Problem Relation Age of Onset   Healthy Mother    Healthy Father    Heart murmur Maternal Grandmother     Social  History Social History[1]   Allergies   Junel fe 1.5-30 [norethin ace-eth estrad-fe]   Review of Systems Review of Systems Per HPI  Physical Exam Triage Vital Signs ED Triage Vitals [07/12/24 1715]  Encounter Vitals Group     BP 110/75     Girls Systolic BP Percentile      Girls Diastolic BP Percentile      Boys Systolic BP Percentile      Boys Diastolic BP Percentile      Pulse Rate 100     Resp 18     Temp 99.4 F (37.4 C)     Temp Source Oral     SpO2 99 %     Weight      Height      Head Circumference      Peak Flow      Pain Score 0     Pain Loc      Pain Education      Exclude from Growth Chart    No data found.  Updated Vital Signs BP 110/75 (BP Location: Right Arm)   Pulse 100   Temp 99.4 F (37.4 C) (Oral)  Resp 18   LMP 06/13/2024 (Exact Date)   SpO2 99%   Visual Acuity Right Eye Distance:   Left Eye Distance:   Bilateral Distance:    Right Eye Near:   Left Eye Near:    Bilateral Near:     Physical Exam Vitals and nursing note reviewed.  Constitutional:      General: She is not in acute distress.    Appearance: Normal appearance.  HENT:     Head: Normocephalic.  Eyes:     Extraocular Movements: Extraocular movements intact.     Pupils: Pupils are equal, round, and reactive to light.  Cardiovascular:     Rate and Rhythm: Normal rate and regular rhythm.     Pulses: Normal pulses.     Heart sounds: Normal heart sounds.  Pulmonary:     Effort: Pulmonary effort is normal.     Breath sounds: Normal breath sounds.  Musculoskeletal:     Cervical back: Normal range of motion.  Skin:    General: Skin is warm and dry.  Neurological:     General: No focal deficit present.     Mental Status: She is alert and oriented to person, place, and time.  Psychiatric:        Mood and Affect: Mood normal.        Behavior: Behavior normal.      UC Treatments / Results  Labs (all labs ordered are listed, but only abnormal results are  displayed) Labs Reviewed  POCT URINE PREGNANCY - Normal    EKG   Radiology No results found.  Procedures Procedures (including critical care time)  Medications Ordered in UC Medications - No data to display  Initial Impression / Assessment and Plan / UC Course  I have reviewed the triage vital signs and the nursing notes.  Pertinent labs & imaging results that were available during my care of the patient were reviewed by me and considered in my medical decision making (see chart for details).  Urine pregnancy test was negative.  Patient was advised of same.  Patient was advised if menses does not start over the next 1 to 3 weeks, recommend repeat testing.  Patient was in agreement with this plan of care and verbalizes understanding.  All questions were answered.  Patient stable for discharge.   Final Clinical Impressions(s) / UC Diagnoses   Final diagnoses:  None   Discharge Instructions   None    ED Prescriptions   None    PDMP not reviewed this encounter.     [1]  Social History Tobacco Use   Smoking status: Never   Smokeless tobacco: Never  Vaping Use   Vaping status: Never Used  Substance Use Topics   Alcohol use: Not Currently   Drug use: Not Currently     Gilmer Etta PARAS, NP 07/12/24 1741

## 2024-07-12 NOTE — Discharge Instructions (Addendum)
Patient declined AVS 

## 2024-07-12 NOTE — ED Triage Notes (Signed)
 States period is 2 days late.  States had intercourse on nov.  20th.  Worried she may be pregnant.

## 2024-08-16 ENCOUNTER — Encounter: Admitting: Obstetrics & Gynecology
